# Patient Record
Sex: Female | Born: 1975 | Race: White | Hispanic: No | Marital: Single | Smoking: Former smoker
Health system: Southern US, Community
[De-identification: ages and names within clinical notes are randomized; demographics above are authoritative.]

## PROBLEM LIST (undated history)

## (undated) DIAGNOSIS — E7800 Pure hypercholesterolemia, unspecified: Secondary | ICD-10-CM

## (undated) DIAGNOSIS — F419 Anxiety disorder, unspecified: Secondary | ICD-10-CM

## (undated) DIAGNOSIS — Z8249 Family history of ischemic heart disease and other diseases of the circulatory system: Secondary | ICD-10-CM

## (undated) DIAGNOSIS — G43909 Migraine, unspecified, not intractable, without status migrainosus: Secondary | ICD-10-CM

## (undated) HISTORY — DX: Migraine, unspecified, not intractable, without status migrainosus: G43.909

## (undated) HISTORY — DX: Anxiety disorder, unspecified: F41.9

## (undated) HISTORY — DX: Family history of ischemic heart disease and other diseases of the circulatory system: Z82.49

## (undated) HISTORY — DX: Pure hypercholesterolemia, unspecified: E78.00

---

## 1999-02-06 ENCOUNTER — Other Ambulatory Visit: Admission: RE | Admit: 1999-02-06 | Discharge: 1999-02-06 | Payer: Self-pay | Admitting: Obstetrics and Gynecology

## 2000-10-20 ENCOUNTER — Other Ambulatory Visit: Admission: RE | Admit: 2000-10-20 | Discharge: 2000-10-20 | Payer: Self-pay | Admitting: Obstetrics and Gynecology

## 2002-05-05 ENCOUNTER — Other Ambulatory Visit: Admission: RE | Admit: 2002-05-05 | Discharge: 2002-05-05 | Payer: Self-pay | Admitting: Obstetrics and Gynecology

## 2004-08-30 ENCOUNTER — Other Ambulatory Visit: Admission: RE | Admit: 2004-08-30 | Discharge: 2004-08-30 | Payer: Self-pay | Admitting: Obstetrics and Gynecology

## 2004-11-26 ENCOUNTER — Ambulatory Visit (HOSPITAL_COMMUNITY): Admission: RE | Admit: 2004-11-26 | Discharge: 2004-11-26 | Payer: Self-pay | Admitting: Obstetrics and Gynecology

## 2005-01-12 ENCOUNTER — Inpatient Hospital Stay (HOSPITAL_COMMUNITY): Admission: AD | Admit: 2005-01-12 | Discharge: 2005-01-12 | Payer: Self-pay | Admitting: Obstetrics and Gynecology

## 2005-02-17 ENCOUNTER — Inpatient Hospital Stay (HOSPITAL_COMMUNITY): Admission: AD | Admit: 2005-02-17 | Discharge: 2005-02-19 | Payer: Self-pay | Admitting: Obstetrics and Gynecology

## 2005-03-24 ENCOUNTER — Other Ambulatory Visit: Admission: RE | Admit: 2005-03-24 | Discharge: 2005-03-24 | Payer: Self-pay | Admitting: Obstetrics and Gynecology

## 2021-06-07 ENCOUNTER — Observation Stay (HOSPITAL_COMMUNITY)
Admission: EM | Admit: 2021-06-07 | Discharge: 2021-06-09 | Disposition: A | Discharge status: Home / Self Care | Payer: 59 | Attending: Internal Medicine | Admitting: Internal Medicine

## 2021-06-07 ENCOUNTER — Emergency Department (HOSPITAL_COMMUNITY): Payer: 59

## 2021-06-07 ENCOUNTER — Other Ambulatory Visit: Payer: Self-pay

## 2021-06-07 DIAGNOSIS — I951 Orthostatic hypotension: Secondary | ICD-10-CM | POA: Diagnosis not present

## 2021-06-07 DIAGNOSIS — R079 Chest pain, unspecified: Secondary | ICD-10-CM | POA: Diagnosis present

## 2021-06-07 DIAGNOSIS — R9389 Abnormal findings on diagnostic imaging of other specified body structures: Secondary | ICD-10-CM

## 2021-06-07 DIAGNOSIS — Z20822 Contact with and (suspected) exposure to covid-19: Secondary | ICD-10-CM | POA: Diagnosis not present

## 2021-06-07 DIAGNOSIS — I409 Acute myocarditis, unspecified: Secondary | ICD-10-CM | POA: Diagnosis not present

## 2021-06-07 DIAGNOSIS — I319 Disease of pericardium, unspecified: Secondary | ICD-10-CM | POA: Diagnosis present

## 2021-06-07 DIAGNOSIS — R102 Pelvic and perineal pain: Secondary | ICD-10-CM

## 2021-06-07 DIAGNOSIS — N920 Excessive and frequent menstruation with regular cycle: Secondary | ICD-10-CM | POA: Diagnosis present

## 2021-06-07 DIAGNOSIS — R0602 Shortness of breath: Secondary | ICD-10-CM | POA: Insufficient documentation

## 2021-06-07 LAB — TROPONIN I (HIGH SENSITIVITY): Troponin I (High Sensitivity): 128 ng/L (ref <18)

## 2021-06-07 LAB — CBC WITH DIFFERENTIAL/PLATELET
Abs Immature Granulocytes: 0.06 10*3/uL (ref 0.00–0.07)
Basophils Absolute: 0.1 10*3/uL (ref 0.0–0.1)
Basophils Relative: 1 %
Eosinophils Absolute: 0.4 10*3/uL (ref 0.0–0.5)
Eosinophils Relative: 4 %
HCT: 36.8 % (ref 36.0–46.0)
Hemoglobin: 12.7 g/dL (ref 12.0–15.0)
Immature Granulocytes: 1 %
Lymphocytes Relative: 7 %
Lymphs Abs: 0.7 10*3/uL (ref 0.7–4.0)
MCH: 30.8 pg (ref 26.0–34.0)
MCHC: 34.5 g/dL (ref 30.0–36.0)
MCV: 89.3 fL (ref 80.0–100.0)
Monocytes Absolute: 0.8 10*3/uL (ref 0.1–1.0)
Monocytes Relative: 8 %
Neutro Abs: 7.1 10*3/uL (ref 1.7–7.7)
Neutrophils Relative %: 79 %
Platelets: 204 10*3/uL (ref 150–400)
RBC: 4.12 MIL/uL (ref 3.87–5.11)
RDW: 12.3 % (ref 11.5–15.5)
WBC: 9 10*3/uL (ref 4.0–10.5)
nRBC: 0 % (ref 0.0–0.2)

## 2021-06-07 LAB — URINALYSIS, ROUTINE W REFLEX MICROSCOPIC
Bilirubin Urine: NEGATIVE
Glucose, UA: NEGATIVE mg/dL
Hgb urine dipstick: NEGATIVE
Ketones, ur: NEGATIVE mg/dL
Leukocytes,Ua: NEGATIVE
Nitrite: NEGATIVE
Protein, ur: NEGATIVE mg/dL
Specific Gravity, Urine: 1.023 (ref 1.005–1.030)
pH: 5 (ref 5.0–8.0)

## 2021-06-07 LAB — COMPREHENSIVE METABOLIC PANEL
ALT: 24 U/L (ref 0–44)
AST: 44 U/L — ABNORMAL HIGH (ref 15–41)
Albumin: 3.2 g/dL — ABNORMAL LOW (ref 3.5–5.0)
Alkaline Phosphatase: 110 U/L (ref 38–126)
Anion gap: 9 (ref 5–15)
BUN: 10 mg/dL (ref 6–20)
CO2: 23 mmol/L (ref 22–32)
Calcium: 8.6 mg/dL — ABNORMAL LOW (ref 8.9–10.3)
Chloride: 101 mmol/L (ref 98–111)
Creatinine, Ser: 0.73 mg/dL (ref 0.44–1.00)
GFR, Estimated: >60 mL/min (ref >60)
Glucose, Bld: 120 mg/dL — ABNORMAL HIGH (ref 70–99)
Potassium: 3.8 mmol/L (ref 3.5–5.1)
Sodium: 133 mmol/L — ABNORMAL LOW (ref 135–145)
Total Bilirubin: 0.5 mg/dL (ref 0.3–1.2)
Total Protein: 6.2 g/dL — ABNORMAL LOW (ref 6.5–8.1)

## 2021-06-07 LAB — LIPASE, BLOOD: Lipase: 24 U/L (ref 11–51)

## 2021-06-07 LAB — I-STAT BETA HCG BLOOD, ED (MC, WL, AP ONLY): I-stat hCG, quantitative: <5 m[IU]/mL (ref <5)

## 2021-06-07 NOTE — ED Triage Notes (Signed)
Pt c/o LLQ pain, chest pain, shortness of breath and fevers.

## 2021-06-07 NOTE — ED Provider Notes (Signed)
Emergency Medicine Provider Triage Evaluation Note  Judy Walsh , a 45 y.o. female  was evaluated in triage.  Pt complains of CP, SOB, and LLQ abdominal pain.  Associated vaginal bleeding.  Denies urinary symptoms.  States she has had subjective fevers, but negative covid and flu tests yesterday.  Sent by OBGYN.  Review of Systems  Positive: CP, SOB, ABd pain Negative: Dysuria, vomiting  Physical Exam  BP 101/86   Pulse 97   Temp 98.7 F (37.1 C) (Oral)   Resp 18   SpO2 95%  Gen:   Awake, no distress   Resp:  Normal effort  MSK:   Moves extremities without difficulty  Other:    Medical Decision Making  Medically screening exam initiated at 10:34 PM.  Appropriate orders placed.  SHRONDA BOEH was informed that the remainder of the evaluation will be completed by another provider, this initial triage assessment does not replace that evaluation, and the importance of remaining in the ED until their evaluation is complete.  CP, Abd pain.     Roxy Horseman, PA-C 06/07/21 2235    Tegeler, Canary Brim, MD 06/07/21 [phone]

## 2021-06-07 NOTE — ED Notes (Signed)
Trop of 128 reported by the lab. CN and EDP notified.

## 2021-06-08 ENCOUNTER — Emergency Department (HOSPITAL_COMMUNITY): Payer: 59

## 2021-06-08 ENCOUNTER — Observation Stay (HOSPITAL_BASED_OUTPATIENT_CLINIC_OR_DEPARTMENT_OTHER): Payer: 59

## 2021-06-08 ENCOUNTER — Encounter (HOSPITAL_COMMUNITY): Payer: Self-pay | Admitting: Internal Medicine

## 2021-06-08 DIAGNOSIS — I319 Disease of pericardium, unspecified: Secondary | ICD-10-CM

## 2021-06-08 DIAGNOSIS — I428 Other cardiomyopathies: Secondary | ICD-10-CM | POA: Diagnosis not present

## 2021-06-08 DIAGNOSIS — I514 Myocarditis, unspecified: Secondary | ICD-10-CM

## 2021-06-08 HISTORY — DX: Disease of pericardium, unspecified: I31.9

## 2021-06-08 LAB — FERRITIN: Ferritin: 55 ng/mL (ref 11–307)

## 2021-06-08 LAB — IRON AND TIBC
Iron: 22 ug/dL — ABNORMAL LOW (ref 28–170)
Saturation Ratios: 5 % — ABNORMAL LOW (ref 10.4–31.8)
TIBC: 400 ug/dL (ref 250–450)
UIBC: 378 ug/dL

## 2021-06-08 LAB — LIPID PANEL
Cholesterol: 139 mg/dL (ref 0–200)
HDL: 54 mg/dL (ref >40)
LDL Cholesterol: 61 mg/dL (ref 0–99)
Total CHOL/HDL Ratio: 2.6 RATIO
Triglycerides: 121 mg/dL (ref <150)
VLDL: 24 mg/dL (ref 0–40)

## 2021-06-08 LAB — SEDIMENTATION RATE: Sed Rate: 20 mm/hr (ref 0–22)

## 2021-06-08 LAB — ECHOCARDIOGRAM COMPLETE
AR max vel: 3.36 cm2
AV Area VTI: 3.14 cm2
AV Area mean vel: 2.92 cm2
AV Mean grad: 3 mmHg
AV Peak grad: 4.3 mmHg
Ao pk vel: 1.04 m/s
Area-P 1/2: 5.02 cm2
Calc EF: 54.4 %
Height: 68 in
MV VTI: 2.91 cm2
S' Lateral: 3 cm
Single Plane A2C EF: 57.1 %
Single Plane A4C EF: 51.1 %
Weight: 2640 oz

## 2021-06-08 LAB — HIV ANTIBODY (ROUTINE TESTING W REFLEX): HIV Screen 4th Generation wRfx: NONREACTIVE

## 2021-06-08 LAB — RESP PANEL BY RT-PCR (FLU A&B, COVID) ARPGX2
Influenza A by PCR: NEGATIVE
Influenza B by PCR: NEGATIVE
SARS Coronavirus 2 by RT PCR: NEGATIVE

## 2021-06-08 LAB — BRAIN NATRIURETIC PEPTIDE: B Natriuretic Peptide: 811.8 pg/mL — ABNORMAL HIGH (ref 0.0–100.0)

## 2021-06-08 LAB — TROPONIN I (HIGH SENSITIVITY): Troponin I (High Sensitivity): 122 ng/L (ref <18)

## 2021-06-08 LAB — TSH: TSH: 1.317 u[IU]/mL (ref 0.350–4.500)

## 2021-06-08 LAB — T4, FREE: Free T4: 0.77 ng/dL (ref 0.61–1.12)

## 2021-06-08 LAB — C-REACTIVE PROTEIN: CRP: 11.6 mg/dL — ABNORMAL HIGH (ref <1.0)

## 2021-06-08 MED ORDER — IBUPROFEN 400 MG PO TABS
800.0000 mg | ORAL_TABLET | Freq: Three times a day (TID) | ORAL | Status: DC
  Administered 2021-06-08 – 2021-06-09 (×4): 800 mg via ORAL
  Filled 2021-06-08 (×4): qty 2

## 2021-06-08 MED ORDER — COLCHICINE 0.6 MG PO TABS
0.6000 mg | ORAL_TABLET | Freq: Two times a day (BID) | ORAL | Status: DC
  Administered 2021-06-08 – 2021-06-09 (×2): 0.6 mg via ORAL
  Filled 2021-06-08 (×2): qty 1

## 2021-06-08 MED ORDER — ONDANSETRON HCL 4 MG/2ML IJ SOLN: 4.0000 mg | Freq: Four times a day (QID) | INTRAMUSCULAR | Status: DC | PRN

## 2021-06-08 MED ORDER — COLCHICINE 0.6 MG PO TABS
1.2000 mg | ORAL_TABLET | Freq: Once | ORAL | Status: AC
  Administered 2021-06-08: 1.2 mg via ORAL
  Filled 2021-06-08: qty 2

## 2021-06-08 MED ORDER — ACETAMINOPHEN 325 MG PO TABS
650.0000 mg | ORAL_TABLET | ORAL | Status: DC | PRN
  Administered 2021-06-08 (×2): 650 mg via ORAL
  Filled 2021-06-08 (×2): qty 2

## 2021-06-08 NOTE — Progress Notes (Signed)
Pt arrived to the floor from ED at approx. 1400. Pt is under observation, hooked up to tele alert & oriented x4 w/ no PMH. VSS and afebrile. Pt c/o currently chest pain and head ache, but mild. Tylenol given at 1403. 20G R AC flushes and saline locked. Pt is able to eat, void, no BM since arrival to [facility]. Pt given menu, bed brakes locked. Will continue to monitor.

## 2021-06-08 NOTE — Progress Notes (Signed)
   Seen and personally examined.  She was admitted earlier this morning by our overnight call fellow.  Working diagnosis is a myopericarditis with mild flat elevation of troponin and elevated BNP.  She was not felt to be volume overloaded, however and was given a dose of colchicine.  Echo was performed today and I personally reviewed it.  It shows normal systolic and diastolic function without any wall motion abnormalities.  There is no pericardial effusion.  I spoke with her at some length at the bedside.  He does have like she had a viral illness with several members having influenza A but she tested negative here.  She did have fever and body aches.  She is tired today.  She thinks that her chest heaviness is improved somewhat with the colchicine and I would recommend continuing it at 0.6 mg twice daily and will start ibuprofen 800 mg 3 times daily in addition to that. Labs also indicated low iron stores, although she is not "anemic" - history of DUB - advised she discuss with her GYN as to whether she should take iron.  Will re-examine tomorrow - if she is showing clinical improvement, may be able to be discharged.  Chrystie Nose, MD, [facility], FACP  [place]  [facility]  Medical Director of the [facility] Lipid [facility] &  Cardiovascular Risk Reduction Clinic Diplomate of the American Board of Clinical Lipidology Attending Cardiologist  Direct Dial: [phone]  Fax: [phone]  Website:  [email or web address]

## 2021-06-08 NOTE — H&P (Signed)
Cardiology Admission History and Physical:   Patient ID: ZAVANNAH KEMMERER MRN: [id number]; DOB: Nov 28, 1975   Admission date: 06/07/2021  PCP:  Pcp, No   [facility] Providers Cardiologist:  None       Chief Complaint:  "Today I started to feel short of breath and had some chest pain"  Patient Profile:   Judy Walsh is a 45 y.o. female with no significant PMHx who is being seen 06/08/2021 for the evaluation of chest pain/SOB.  History of Present Illness:   Judy Walsh has no prior cardiac history.   Recently, Judy Walsh has been struggling with very heavy menstrual bleeding, but up until the last few days have generally been feeling quite well.  About 2 weeks ago her daughter had the flu, and a few days ago one of her coworkers also developed the flu.  Starting a few days ago, Judy Walsh started to have crampy lower abdominal pain and was feeling quite fatigued.  2 nights ago she had chills and sweats when she went to bed, and last night had an objective fever of 101.  Starting today she also started to have sharp left-sided chest pain that did appear to be worse when she laid down and may be a little bit better when she sat up.  There was no relation to exertion.  Yesterday evening she also noticed that she was much more short of breath when she got up and walked to the kitchen.  Her shortness of breath resolved when she sat down and rested but she remained quite fatigued.  She denies any lower extremity swelling.  Given these new and progressive symptoms Judy Walsh decided to come to the emergency department for evaluation.  On arrival to the emergency department Judy Walsh was hemodynamically stable.  She had a chest x-ray that was unrevealing.  Her EKG was normal.  Interestingly her high-sensitivity troponin was initially elevated at 128 and trended to 122.  Her BNP was also quite elevated at 811.  She also had elevated inflammatory markers with a CRP of 11.6.  Her flu and COVID  were negative.  History reviewed. No pertinent past medical history.  History reviewed. No pertinent surgical history.   Medications Prior to Admission: Prior to Admission medications   Medication Sig Start Date End Date Taking? Authorizing Provider  COLLAGEN PO Take 1 capsule by mouth daily.   Yes [provider]  ibuprofen (ADVIL) 200 MG tablet Take 800 mg by mouth every 6 (six) hours as needed for headache, moderate pain or fever.   Yes [provider]  MILI 0.25-35 MG-MCG tablet Take 1 tablet by mouth daily. 05/25/21  Yes [provider]  Multiple Vitamins-Minerals (WOMENS MULTI GUMMIES) CHEW Chew 2 tablets by mouth daily.   Yes [provider]  OVER THE COUNTER MEDICATION Take 2 tablets by mouth in the morning and at bedtime. Nutriform vitamin   Yes [provider]     Allergies:   No Known Allergies  Social History:   Social History   Socioeconomic History   Marital status: Single    Spouse name: Not on file   Number of children: Not on file   Years of education: Not on file   Highest education level: Not on file  Occupational History   Not on file  Tobacco Use   Smoking status: Not on file   Smokeless tobacco: Not on file  Substance and Sexual Activity   Alcohol use: Not on file  Drug use: Not on file   Sexual activity: Not on file  Other Topics Concern   Not on file  Social History Narrative   Not on file   Social Determinants of Health   Financial Resource Strain: Not on file  Food Insecurity: Not on file  Transportation Needs: Not on file  Physical Activity: Not on file  Stress: Not on file  Social Connections: Not on file  Intimate Partner Violence: Not on file    Family History:   The patient's family history includes CVA in her father.    ROS:  Please see the history of present illness.  All other ROS reviewed and negative.     Physical Exam/Data:   Vitals:   06/08/21 0315 06/08/21 0330 06/08/21  0345 06/08/21 0400  BP: 93/71 96/76 96/73  99/80  Pulse: 96 95 95 (!) 102  Resp: (!) 22 12 (!) 24 (!) 27  Temp:      TempSrc:      SpO2: 99% 97% 100% 98%   No intake or output data in the 24 hours ending 06/08/21 0539 No flowsheet data found.   There is no height or weight on file to calculate BMI.  General:  Well nourished, well developed, in no acute distress HEENT: normal Neck: no JVD Vascular: No carotid bruits; Distal pulses 2+ bilaterally   Cardiac:  normal S1, S2; RRR; no murmur  Lungs:  clear to auscultation bilaterally, no wheezing, rhonchi or rales  Abd: soft, nontender, no hepatomegaly  Ext: no edema Musculoskeletal:  No deformities, BUE and BLE strength normal and equal Skin: warm and dry  Neuro:  CNs 2-12 intact, no focal abnormalities noted Psych:  Normal affect   EKG:  The ECG that was done 06/07/21 was personally reviewed and demonstrates NSR  Relevant CV Studies: None  Laboratory Data:  High Sensitivity Troponin:   Recent Labs  Lab 06/07/21 2244 06/08/21 0053  TROPONINIHS 128* 122*      Chemistry Recent Labs  Lab 06/07/21 2244  NA 133*  K 3.8  CL 101  CO2 23  GLUCOSE 120*  BUN 10  CREATININE 0.73  CALCIUM 8.6*  GFRNONAA >60  ANIONGAP 9    Recent Labs  Lab 06/07/21 2244  PROT 6.2*  ALBUMIN 3.2*  AST 44*  ALT 24  ALKPHOS 110  BILITOT 0.5   Lipids No results for input(s): CHOL, TRIG, HDL, LABVLDL, LDLCALC, CHOLHDL in the last 168 hours. Hematology Recent Labs  Lab 06/07/21 2244  WBC 9.0  RBC 4.12  HGB 12.7  HCT 36.8  MCV 89.3  MCH 30.8  MCHC 34.5  RDW 12.3  PLT 204   Thyroid No results for input(s): TSH, FREET4 in the last 168 hours. BNP Recent Labs  Lab 06/08/21 0212  BNP 811.8*    DDimer No results for input(s): DDIMER in the last 168 hours.   Radiology/Studies:  DG Chest 2 View  Result Date: 06/08/2021 CLINICAL DATA:  Chest pain, shortness of breath. EXAM: CHEST - 2 VIEW COMPARISON:  None. FINDINGS: The heart  size and mediastinal contours are within normal limits. Both lungs are clear. A 4 mm nodular opacity is seen in the mid left lung. There is a 5 mm nodular opacity in the right upper lobe. No acute osseous abnormality. IMPRESSION: 1. No acute cardiopulmonary process. 2. Vague nodular opacities in the mid left lung and right upper lobe which may be due to overlapping structures. Comparison with older imaging studies or follow-up is recommended to  exclude pulmonary nodule. Electronically Signed   By: Thornell Sartorius M.D.   On: 06/08/2021 00:46   US PELVIC COMPLETE W TRANSVAGINAL AND TORSION R/O  Result Date: 06/08/2021 CLINICAL DATA:  Left lower quadrant abdominal pain.  Unknown LMP. EXAM: TRANSABDOMINAL AND TRANSVAGINAL ULTRASOUND OF PELVIS DOPPLER ULTRASOUND OF OVARIES TECHNIQUE: Both transabdominal and transvaginal ultrasound examinations of the pelvis were performed. Transabdominal technique was performed for global imaging of the pelvis including uterus, ovaries, adnexal regions, and pelvic cul-de-sac. It was necessary to proceed with endovaginal exam following the transabdominal exam to visualize the endometrium. Color and duplex Doppler ultrasound was utilized to evaluate blood flow to the ovaries. COMPARISON:  None. FINDINGS: Uterus Measurements: 9.7 x 4.8 x 6.8 cm = volume: 164 mL. No fibroids or other mass visualized. The uterus is anteverted. Nabothian cyst noted within the external cervical os. The cervix is otherwise unremarkable. Endometrium Thickness: 4 mm.  No focal abnormality visualized. Right ovary Measurements: 2.6 x 1.4 x 1.4 cm = volume: 3 mL. Normal appearance/no adnexal mass. Left ovary Measurements: 3.7 x 1.7 x 1.7 cm = volume: 6 mL. Normal appearance/no adnexal mass. Pulsed Doppler evaluation of both ovaries demonstrates normal low-resistance arterial and venous waveforms. Other findings No abnormal free fluid. IMPRESSION: Normal pelvic sonogram Electronically Signed   By: Helyn Numbers  M.D.   On: 06/08/2021 00:05     Assessment and Plan:   #Myopericarditis: - Presented with antecedent fever, fatigue, sick contacts followed by positional L sided chest pain + associated SOB. Troponins flat but elevated with no ischemic EKG changes. BNP also elevated although clinically appears relatively euvolemic.  - While her flu and COVID were negative, she certainly has symptoms consistent with a viral infection (and had recent sick contacts). I suspect she has developed myopericardits. Fortunately, she is hemodynamically stable with relatively mild symptoms  - Check echo in the AM. Can consider cMRI as well  - Extended RVP deferred given classic symptoms + sick contacts  - Check TSH/T4, lipid panel, A1c - Will give one dose of colchicine now. Favor deferring NSAIDs until we get an assessment of her BiV function (also has had very heavy menstrual bleeding recently)   Risk Assessment/Risk Scores:  Severity of Illness: The appropriate patient status for this patient is OBSERVATION. Observation status is judged to be reasonable and necessary in order to provide the required intensity of service to ensure the patient's safety. The patient's presenting symptoms, physical exam findings, and initial radiographic and laboratory data in the context of their medical condition is felt to place them at decreased risk for further clinical deterioration. Furthermore, it is anticipated that the patient will be medically stable for discharge from the hospital within 2 midnights of admission.    For questions or updates, please contact [facility] Please consult [email or web address] for contact info under     Signed, Livingston Diones, MD  06/08/2021 5:39 AM

## 2021-06-08 NOTE — Plan of Care (Signed)
  Problem: Pain Managment: Goal: General experience of comfort will improve 06/08/2021 2057 by Orson Ape, RN Outcome: Progressing 06/08/2021 2057 by Orson Ape, RN Outcome: Progressing   Problem: Education: Goal: Knowledge of General Education information will improve Description: Including pain rating scale, medication(s)/side effects and non-pharmacologic comfort measures Outcome: Progressing   Problem: Health Behavior/Discharge Planning: Goal: Ability to manage health-related needs will improve Outcome: Progressing   Problem: Clinical Measurements: Goal: Ability to maintain clinical measurements within normal limits will improve Outcome: Progressing Goal: Will remain free from infection Outcome: Progressing Goal: Diagnostic test results will improve Outcome: Progressing Goal: Respiratory complications will improve Outcome: Progressing Goal: Cardiovascular complication will be avoided Outcome: Progressing   Problem: Activity: Goal: Risk for activity intolerance will decrease Outcome: Progressing   Problem: Nutrition: Goal: Adequate nutrition will be maintained Outcome: Progressing   Problem: Coping: Goal: Level of anxiety will decrease Outcome: Progressing   Problem: Elimination: Goal: Will not experience complications related to bowel motility Outcome: Progressing Goal: Will not experience complications related to urinary retention Outcome: Progressing   Problem: Pain Managment: Goal: General experience of comfort will improve Outcome: Progressing   Problem: Safety: Goal: Ability to remain free from injury will improve Outcome: Progressing   Problem: Skin Integrity: Goal: Risk for impaired skin integrity will decrease Outcome: Progressing

## 2021-06-08 NOTE — ED Notes (Signed)
MD made aware of BP, pt resting at present

## 2021-06-08 NOTE — ED Notes (Signed)
Patient coming to room from scan.

## 2021-06-08 NOTE — Plan of Care (Signed)
Will continue to monitor.

## 2021-06-08 NOTE — Progress Notes (Signed)
*  PRELIMINARY RESULTS* Echocardiogram 2D Echocardiogram has been performed.  Judy Walsh 06/08/2021, 12:52 PM

## 2021-06-08 NOTE — ED Provider Notes (Signed)
[facility] EMERGENCY DEPARTMENT Provider Note   CSN: [id number] Arrival date & time: 06/07/21  2144     History Chief Complaint  Patient presents with   Abdominal Pain    Judy Walsh is a 45 y.o. female.  Patient is a 45 year old female with no significant past medical history.  She presents with a 2-day history of weakness, fatigue, fever, and feeling generally poorly.  She was seen by urgent care and diagnosed with URI, but had negative COVID and influenza tests.  Patient began experiencing chest discomfort this evening and presents for evaluation of this.  She has no prior cardiac history.  She denies any shortness of breath, nausea, diaphoresis, or radiation to the arm or jaw.  She denies any exertional complaints.  She also reports some left lower quadrant abdominal discomfort, but no diarrhea or urinary complaints.  The history is provided by the patient.      No past medical history on file.  There are no problems to display for this patient.      OB History   No obstetric history on file.     No family history on file.     Home Medications Prior to Admission medications   Not on File    Allergies    Patient has no known allergies.  Review of Systems   Review of Systems  All other systems reviewed and are negative.  Physical Exam Updated Vital Signs BP 93/70 (BP Location: Right Arm)   Pulse 98   Temp 98.5 F (36.9 C) (Oral)   Resp (!) 22   LMP 05/30/2021 Comment: pt shielded  SpO2 99%   Physical Exam Vitals and nursing note reviewed.  Constitutional:      General: She is not in acute distress.    Appearance: She is well-developed. She is not diaphoretic.  HENT:     Head: Normocephalic and atraumatic.  Cardiovascular:     Rate and Rhythm: Normal rate and regular rhythm.     Heart sounds: No murmur heard.   No friction rub. No gallop.  Pulmonary:     Effort: Pulmonary effort is normal. No respiratory distress.      Breath sounds: Normal breath sounds. No wheezing.  Abdominal:     General: Bowel sounds are normal. There is no distension.     Palpations: Abdomen is soft.     Tenderness: There is abdominal tenderness in the left lower quadrant. There is no right CVA tenderness, left CVA tenderness, guarding or rebound.  Musculoskeletal:        General: Normal range of motion.     Cervical back: Normal range of motion and neck supple.  Skin:    General: Skin is warm and dry.  Neurological:     General: No focal deficit present.     Mental Status: She is alert and oriented to person, place, and time.    ED Results / Procedures / Treatments   Labs (all labs ordered are listed, but only abnormal results are displayed) Labs Reviewed  COMPREHENSIVE METABOLIC PANEL - Abnormal; Notable for the following components:      Result Value   Sodium 133 (*)    Glucose, Bld 120 (*)    Calcium 8.6 (*)    Total Protein 6.2 (*)    Albumin 3.2 (*)    AST 44 (*)    All other components within normal limits  URINALYSIS, ROUTINE W REFLEX MICROSCOPIC - Abnormal; Notable for the following components:  APPearance HAZY (*)    All other components within normal limits  TROPONIN I (HIGH SENSITIVITY) - Abnormal; Notable for the following components:   Troponin I (High Sensitivity) 128 (*)    All other components within normal limits  RESP PANEL BY RT-PCR (FLU A&B, COVID) ARPGX2  LIPASE, BLOOD  CBC WITH DIFFERENTIAL/PLATELET  CBC  I-STAT BETA HCG BLOOD, ED ([facility], [facility], [facility] ONLY)  I-STAT BETA HCG BLOOD, ED ([facility], [facility], [facility] ONLY)  TROPONIN I (HIGH SENSITIVITY)    EKG EKG Interpretation  Date/Time:  Friday June 07 2021 22:33:46 EST Ventricular Rate:  90 PR Interval:  124 QRS Duration: 74 QT Interval:  362 QTC Calculation: 442 R Axis:   79 Text Interpretation: Normal sinus rhythm Nonspecific T wave abnormality Abnormal ECG Confirmed by Veryl Speak [phone]) on 06/08/2021 2:05:30 AM  Radiology DG Chest 2  View  Result Date: 06/08/2021 CLINICAL DATA:  Chest pain, shortness of breath. EXAM: CHEST - 2 VIEW COMPARISON:  None. FINDINGS: The heart size and mediastinal contours are within normal limits. Both lungs are clear. A 4 mm nodular opacity is seen in the mid left lung. There is a 5 mm nodular opacity in the right upper lobe. No acute osseous abnormality. IMPRESSION: 1. No acute cardiopulmonary process. 2. Vague nodular opacities in the mid left lung and right upper lobe which may be due to overlapping structures. Comparison with older imaging studies or follow-up is recommended to exclude pulmonary nodule. Electronically Signed   By: Brett Fairy M.D.   On: 06/08/2021 00:46   US PELVIC COMPLETE W TRANSVAGINAL AND TORSION R/O  Result Date: 06/08/2021 CLINICAL DATA:  Left lower quadrant abdominal pain.  Unknown LMP. EXAM: TRANSABDOMINAL AND TRANSVAGINAL ULTRASOUND OF PELVIS DOPPLER ULTRASOUND OF OVARIES TECHNIQUE: Both transabdominal and transvaginal ultrasound examinations of the pelvis were performed. Transabdominal technique was performed for global imaging of the pelvis including uterus, ovaries, adnexal regions, and pelvic cul-de-sac. It was necessary to proceed with endovaginal exam following the transabdominal exam to visualize the endometrium. Color and duplex Doppler ultrasound was utilized to evaluate blood flow to the ovaries. COMPARISON:  None. FINDINGS: Uterus Measurements: 9.7 x 4.8 x 6.8 cm = volume: 164 mL. No fibroids or other mass visualized. The uterus is anteverted. Nabothian cyst noted within the external cervical os. The cervix is otherwise unremarkable. Endometrium Thickness: 4 mm.  No focal abnormality visualized. Right ovary Measurements: 2.6 x 1.4 x 1.4 cm = volume: 3 mL. Normal appearance/no adnexal mass. Left ovary Measurements: 3.7 x 1.7 x 1.7 cm = volume: 6 mL. Normal appearance/no adnexal mass. Pulsed Doppler evaluation of both ovaries demonstrates normal low-resistance arterial  and venous waveforms. Other findings No abnormal free fluid. IMPRESSION: Normal pelvic sonogram Electronically Signed   By: Fidela Salisbury M.D.   On: 06/08/2021 00:05    Procedures Procedures   Medications Ordered in ED Medications - No data to display  ED Course  I have reviewed the triage vital signs and the nursing notes.  Pertinent labs & imaging results that were available during my care of the patient were reviewed by me and considered in my medical decision making (see chart for details).    MDM Rules/Calculators/A&P  Patient is an otherwise healthy 45 year old female presenting with complaints of weakness, fatigue, fever, and feeling generally unwell.  She describes temperature of 1-1.7 at home, but is afebrile here.  Laboratory studies obtained reveal a mildly elevated troponin of 118.  Remainder of laboratory studies at this point are otherwise  unremarkable.  Patient's presentation concerning for myocarditis.  This was discussed with Dr. Sherrilee Gilles from cardiology.  He has recommended adding on BNP and inflammatory markers.  Patient does have an elevated CRP of 11.6 and BNP of 800.  Care again discussed with Dr. Sherrilee Gilles who is recommending patient be admitted for echocardiogram and further work-up.  Final Clinical Impression(s) / ED Diagnoses Final diagnoses:  Pelvic pain    Rx / DC Orders ED Discharge Orders     None        Geoffery Lyons, MD 06/08/21 [phone]

## 2021-06-09 ENCOUNTER — Encounter (HOSPITAL_COMMUNITY): Payer: Self-pay | Admitting: Internal Medicine

## 2021-06-09 DIAGNOSIS — I951 Orthostatic hypotension: Secondary | ICD-10-CM | POA: Diagnosis not present

## 2021-06-09 DIAGNOSIS — N920 Excessive and frequent menstruation with regular cycle: Secondary | ICD-10-CM | POA: Diagnosis present

## 2021-06-09 DIAGNOSIS — R9389 Abnormal findings on diagnostic imaging of other specified body structures: Secondary | ICD-10-CM

## 2021-06-09 DIAGNOSIS — I319 Disease of pericardium, unspecified: Secondary | ICD-10-CM | POA: Diagnosis not present

## 2021-06-09 HISTORY — DX: Abnormal findings on diagnostic imaging of other specified body structures: R93.89

## 2021-06-09 LAB — COMPREHENSIVE METABOLIC PANEL
ALT: 36 U/L (ref 0–44)
AST: 49 U/L — ABNORMAL HIGH (ref 15–41)
Albumin: 2.9 g/dL — ABNORMAL LOW (ref 3.5–5.0)
Alkaline Phosphatase: 144 U/L — ABNORMAL HIGH (ref 38–126)
Anion gap: 8 (ref 5–15)
BUN: 9 mg/dL (ref 6–20)
CO2: 25 mmol/L (ref 22–32)
Calcium: 8.7 mg/dL — ABNORMAL LOW (ref 8.9–10.3)
Chloride: 104 mmol/L (ref 98–111)
Creatinine, Ser: 0.68 mg/dL (ref 0.44–1.00)
GFR, Estimated: >60 mL/min (ref >60)
Glucose, Bld: 122 mg/dL — ABNORMAL HIGH (ref 70–99)
Potassium: 3.6 mmol/L (ref 3.5–5.1)
Sodium: 137 mmol/L (ref 135–145)
Total Bilirubin: 0.5 mg/dL (ref 0.3–1.2)
Total Protein: 5.8 g/dL — ABNORMAL LOW (ref 6.5–8.1)

## 2021-06-09 LAB — BRAIN NATRIURETIC PEPTIDE: B Natriuretic Peptide: 551.8 pg/mL — ABNORMAL HIGH (ref 0.0–100.0)

## 2021-06-09 LAB — C-REACTIVE PROTEIN: CRP: 7.6 mg/dL — ABNORMAL HIGH (ref <1.0)

## 2021-06-09 MED ORDER — IBUPROFEN 200 MG PO TABS: 800.0000 mg | ORAL_TABLET | Freq: Three times a day (TID) | ORAL | 0 refills | Status: DC

## 2021-06-09 MED ORDER — FAMOTIDINE 20 MG PO TABS: 20.0000 mg | ORAL_TABLET | Freq: Two times a day (BID) | ORAL | 1 refills | Status: DC

## 2021-06-09 MED ORDER — COLCHICINE 0.6 MG PO TABS: 0.6000 mg | ORAL_TABLET | Freq: Two times a day (BID) | ORAL | 2 refills | Status: DC

## 2021-06-09 MED ORDER — SODIUM CHLORIDE 0.9 % IV BOLUS
1000.0000 mL | Freq: Once | INTRAVENOUS | Status: AC
  Administered 2021-06-09: 1000 mL via INTRAVENOUS

## 2021-06-09 NOTE — Discharge Instructions (Signed)
Ibuprofen and Colchicine are used to treat Myopericarditis. Take Ibuprofen 800 mg three times a day for 4 days, Then take Ibuprofen 600 mg three times a day for 4 days Then take Ibuprofen 400 mg three times a day for 4 days Then take Ibuprofen 200 mg three times a day for 4 days, then stop.  You will need to take Colchicine 0.6 mg twice daily for 3 mos.  You can take Famotidine (Pepcid) 20 mg twice daily while you are taking the Ibuprofen to help protect your stomach.  You can get the Ibuprofen and Famotidine over the counter.  Your chest X-ray showed a non-specific abnormality.  If you cannot get a repeat chest X-ray with a primary care doctor in the next month, discuss with Dr. Rennis Golden at your follow up to arrange a repeat xray.

## 2021-06-09 NOTE — Progress Notes (Signed)
DAILY PROGRESS NOTE   Patient Name: Judy Walsh Date of Encounter: 06/09/2021 Cardiologist: None  Chief Complaint   Positional dizziness  Patient Profile   Judy Walsh is a 45 y.o. female with no significant PMHx who is being seen 06/08/2021 for the evaluation of chest pain/SOB.  Subjective   Noted to have some positional dizziness this morning when waking up to brush her teeth. No chest pain, not clearly better on ibuprofen and colchicine, but working diagnosis is myopericarditis.  Objective   Vitals:   06/08/21 1941 06/08/21 2301 06/09/21 0326 06/09/21 0845  BP: 93/71 102/75 100/73 93/72  Pulse: 87 89 89 91  Resp: 20 17 20 20   Temp: 98.1 F (36.7 C) 97.7 F (36.5 C) 97.8 F (36.6 C) 98 F (36.7 C)  TempSrc: Oral Oral Oral Oral  SpO2: 98% 97% 98% 98%  Weight:      Height:        Intake/Output Summary (Last 24 hours) at 06/09/2021 1130 Last data filed at 06/09/2021 0700 Gross per 24 hour  Intake 360 ml  Output 0 ml  Net 360 ml   Filed Weights   06/08/21 0736  Weight: 74.8 kg    Physical Exam   General appearance: alert and no distress Neck: no carotid bruit, no JVD, and thyroid not enlarged, symmetric, no tenderness/mass/nodules Lungs: clear to auscultation bilaterally Heart: regular tachycardia Abdomen: soft, non-tender; bowel sounds normal; no masses,  no organomegaly and scacphoid Extremities: extremities normal, atraumatic, no cyanosis or edema Pulses: 2+ and symmetric Skin: Skin color, texture, turgor normal. No rashes or lesions Neurologic: Grossly normal Psych: Pleasant  Inpatient Medications    Scheduled Meds:  colchicine  0.6 mg Oral BID   ibuprofen  800 mg Oral TID    Continuous Infusions:   PRN Meds: acetaminophen, ondansetron (ZOFRAN) IV   Labs   Results for orders placed or performed during the hospital encounter of 06/07/21 (from the past 48 hour(s))  Urinalysis, Routine w reflex microscopic Urine, Clean Catch      Status: Abnormal   Collection Time: 06/07/21 10:34 PM  Result Value Ref Range   Color, Urine YELLOW YELLOW   APPearance HAZY (A) CLEAR   Specific Gravity, Urine 1.023 1.005 - 1.030   pH 5.0 5.0 - 8.0   Glucose, UA NEGATIVE NEGATIVE mg/dL   Hgb urine dipstick NEGATIVE NEGATIVE   Bilirubin Urine NEGATIVE NEGATIVE   Ketones, ur NEGATIVE NEGATIVE mg/dL   Protein, ur NEGATIVE NEGATIVE mg/dL   Nitrite NEGATIVE NEGATIVE   Leukocytes,Ua NEGATIVE NEGATIVE    Comment: Performed at [facility], [address] [place], [place] [address]  Troponin I (High Sensitivity)     Status: Abnormal   Collection Time: 06/07/21 10:44 PM  Result Value Ref Range   Troponin I (High Sensitivity) 128 (HH) <18 ng/L    Comment: CRITICAL RESULT CALLED TO, READ BACK BY AND VERIFIED WITH: JOSH NEWTON RN 06/07/21 2348 M KOROLESKI (NOTE) Elevated high sensitivity troponin I (hsTnI) values and significant  changes across serial measurements may suggest ACS but many other  chronic and acute conditions are known to elevate hsTnI results.  Refer to the Links section for chest pain algorithms and additional  guidance. Performed at [facility], [place] [address], [place], [place] [address]   Comprehensive metabolic panel     Status: Abnormal   Collection Time: 06/07/21 10:44 PM  Result Value Ref Range   Sodium 133 (L) 135 - 145 mmol/L  Potassium 3.8 3.5 - 5.1 mmol/L   Chloride 101 98 - 111 mmol/L   CO2 23 22 - 32 mmol/L   Glucose, Bld 120 (H) 70 - 99 mg/dL    Comment: Glucose reference range applies only to samples taken after fasting for at least 8 hours.   BUN 10 6 - 20 mg/dL   Creatinine, Ser 3.82 0.44 - 1.00 mg/dL   Calcium 8.6 (L) 8.9 - 10.3 mg/dL   Total Protein 6.2 (L) 6.5 - 8.1 g/dL   Albumin 3.2 (L) 3.5 - 5.0 g/dL   AST 44 (H) 15 - 41 U/L   ALT 24 0 - 44 U/L   Alkaline Phosphatase 110 38 - 126 U/L   Total Bilirubin 0.5 0.3 - 1.2 mg/dL   GFR, Estimated >50 >53 mL/min    Comment:  (NOTE) Calculated using the CKD-EPI Creatinine Equation (2021)    Anion gap 9 5 - 15    Comment: Performed at [facility], [address] [place], [place] [address]  Lipase, blood     Status: None   Collection Time: 06/07/21 10:44 PM  Result Value Ref Range   Lipase 24 11 - 51 U/L    Comment: Performed at [facility], [address], [place], [place] [address]  CBC with Diff     Status: None   Collection Time: 06/07/21 10:44 PM  Result Value Ref Range   WBC 9.0 4.0 - 10.5 K/uL   RBC 4.12 3.87 - 5.11 MIL/uL   Hemoglobin 12.7 12.0 - 15.0 g/dL   HCT 90.2 40.9 - 73.5 %   MCV 89.3 80.0 - 100.0 fL   MCH 30.8 26.0 - 34.0 pg   MCHC 34.5 30.0 - 36.0 g/dL   RDW 32.9 92.4 - 26.8 %   Platelets 204 150 - 400 K/uL   nRBC 0.0 0.0 - 0.2 %   Neutrophils Relative % 79 %   Neutro Abs 7.1 1.7 - 7.7 K/uL   Lymphocytes Relative 7 %   Lymphs Abs 0.7 0.7 - 4.0 K/uL   Monocytes Relative 8 %   Monocytes Absolute 0.8 0.1 - 1.0 K/uL   Eosinophils Relative 4 %   Eosinophils Absolute 0.4 0.0 - 0.5 K/uL   Basophils Relative 1 %   Basophils Absolute 0.1 0.0 - 0.1 K/uL   Immature Granulocytes 1 %   Abs Immature Granulocytes 0.06 0.00 - 0.07 K/uL    Comment: Performed at [facility], [address], [place], [place] [address]  I-Stat beta hCG blood, ED     Status: None   Collection Time: 06/07/21 10:47 PM  Result Value Ref Range   I-stat hCG, quantitative <5.0 <5 mIU/mL   Comment 3            Comment:   GEST. AGE      CONC.  (mIU/mL)   <=1 WEEK        5 - 50     2 WEEKS       50 - 500     3 WEEKS       100 - 10,000     4 WEEKS     1,000 - 30,000        FEMALE AND NON-PREGNANT FEMALE:     LESS THAN 5 mIU/mL   Troponin I (High Sensitivity)     Status: Abnormal   Collection Time: 06/08/21 12:53 AM  Result Value Ref Range   Troponin I (High Sensitivity)  122 (HH) <18 ng/L    Comment: CRITICAL VALUE NOTED.  VALUE IS CONSISTENT WITH PREVIOUSLY REPORTED AND CALLED  VALUE. (NOTE) Elevated high sensitivity troponin I (hsTnI) values and significant  changes across serial measurements may suggest ACS but many other  chronic and acute conditions are known to elevate hsTnI results.  Refer to the Links section for chest pain algorithms and additional  guidance. Performed at [facility], [place] [address], [place], [place] [address]   Resp Panel by RT-PCR (Flu A&B, Covid) Nasopharyngeal Swab     Status: None   Collection Time: 06/08/21  1:31 AM   Specimen: Nasopharyngeal Swab; Nasopharyngeal(NP) swabs in vial transport medium  Result Value Ref Range   SARS Coronavirus 2 by RT PCR NEGATIVE NEGATIVE    Comment: (NOTE) SARS-CoV-2 target nucleic acids are NOT DETECTED.  The SARS-CoV-2 RNA is generally detectable in upper respiratory specimens during the acute phase of infection. The lowest concentration of SARS-CoV-2 viral copies this assay can detect is 138 copies/mL. A negative result does not preclude SARS-Cov-2 infection and should not be used as the sole basis for treatment or other patient management decisions. A negative result may occur with  improper specimen collection/handling, submission of specimen other than nasopharyngeal swab, presence of viral mutation(s) within the areas targeted by this assay, and inadequate number of viral copies(<138 copies/mL). A negative result must be combined with clinical observations, patient history, and epidemiological information. The expected result is Negative.  Fact Sheet for Patients:  EntrepreneurPulse.com.au  Fact Sheet for Healthcare Providers:  IncredibleEmployment.be  This test is no t yet approved or cleared by the [place] FDA and  has been authorized for detection and/or diagnosis of SARS-CoV-2 by FDA under an Emergency Use Authorization (EUA). This EUA will remain  in effect (meaning this test can be used) for the duration of the COVID-19  declaration under Section 564(b)(1) of the Act, 21 U.S.C.section 360bbb-3(b)(1), unless the authorization is terminated  or revoked sooner.       Influenza A by PCR NEGATIVE NEGATIVE   Influenza B by PCR NEGATIVE NEGATIVE    Comment: (NOTE) The Xpert Xpress SARS-CoV-2/FLU/RSV plus assay is intended as an aid in the diagnosis of influenza from Nasopharyngeal swab specimens and should not be used as a sole basis for treatment. Nasal washings and aspirates are unacceptable for Xpert Xpress SARS-CoV-2/FLU/RSV testing.  Fact Sheet for Patients: EntrepreneurPulse.com.au  Fact Sheet for Healthcare Providers: IncredibleEmployment.be  This test is not yet approved or cleared by the [place] FDA and has been authorized for detection and/or diagnosis of SARS-CoV-2 by FDA under an Emergency Use Authorization (EUA). This EUA will remain in effect (meaning this test can be used) for the duration of the COVID-19 declaration under Section 564(b)(1) of the Act, 21 U.S.C. section 360bbb-3(b)(1), unless the authorization is terminated or revoked.  Performed at [facility], [place] [address], [place], [place] [address]   Sedimentation rate     Status: None   Collection Time: 06/08/21  2:11 AM  Result Value Ref Range   Sed Rate 20 0 - 22 mm/hr    Comment: Performed at [facility] [address] [place], [place] [address]  C-reactive protein     Status: Abnormal   Collection Time: 06/08/21  2:11 AM  Result Value Ref Range   CRP 11.6 (H) <1.0 mg/dL    Comment: Performed at [facility] [address], [place],  [address]  Brain  natriuretic peptide     Status: Abnormal   Collection Time: 06/08/21  2:12 AM  Result Value Ref Range   B Natriuretic Peptide 811.8 (H) 0.0 - 100.0 pg/mL    Comment: Performed at [facility] [address] [place], [place] [address]  Lipid panel     Status: None   Collection Time: 06/08/21   4:45 AM  Result Value Ref Range   Cholesterol 139 0 - 200 mg/dL   Triglycerides 121 <150 mg/dL   HDL 54 >40 mg/dL   Total CHOL/HDL Ratio 2.6 RATIO   VLDL 24 0 - 40 mg/dL   LDL Cholesterol 61 0 - 99 mg/dL    Comment:        Total Cholesterol/HDL:CHD Risk Coronary Heart Disease Risk Table                     Men   Women  1/2 Average Risk   3.4   3.3  Average Risk       5.0   4.4  2 X Average Risk   9.6   7.1  3 X Average Risk  23.4   11.0        Use the calculated Patient Ratio above and the CHD Risk Table to determine the patient's CHD Risk.        ATP III CLASSIFICATION (LDL):  <100     mg/dL   Optimal  100-129  mg/dL   Near or Above                    Optimal  130-159  mg/dL   Borderline  160-189  mg/dL   High  >190     mg/dL   Very High Performed at [facility] [address] [place], [place] [address]   TSH     Status: None   Collection Time: 06/08/21  4:45 AM  Result Value Ref Range   TSH 1.317 0.350 - 4.500 uIU/mL    Comment: Performed by a 3rd Generation assay with a functional sensitivity of <=0.01 uIU/mL. Performed at [facility], [place] [address], [place], [place] [address]   T4, free     Status: None   Collection Time: 06/08/21  4:45 AM  Result Value Ref Range   Free T4 0.77 0.61 - 1.12 ng/dL    Comment: (NOTE) Biotin ingestion may interfere with free T4 tests. If the results are inconsistent with the TSH level, previous test results, or the clinical presentation, then consider biotin interference. If needed, order repeat testing after stopping biotin. Performed at [facility], [place] [address] [place], [place] [address]   Ferritin     Status: None   Collection Time: 06/08/21  4:45 AM  Result Value Ref Range   Ferritin 55 11 - 307 ng/mL    Comment: Performed at [facility], [place] [address], [place], [place] [address]  Iron and TIBC     Status: Abnormal   Collection Time: 06/08/21  4:45 AM  Result Value Ref Range   Iron 22  (L) 28 - 170 ug/dL   TIBC 400 250 - 450 ug/dL   Saturation Ratios 5 (L) 10.4 - 31.8 %   UIBC 378 ug/dL    Comment: Performed at [facility], [place] [address], [place], [place] [address]  HIV Antibody (routine testing w rflx)     Status: None   Collection Time: 06/08/21  5:55 AM  Result Value Ref Range   HIV Screen 4th Generation wRfx Non Reactive Non Reactive    Comment: Performed at [facility], [place] [address] [place], [place] [address]    ECG   N/A  Telemetry   Sinus tachycardia - Personally Reviewed  Radiology    DG Chest 2 View  Result Date: 06/08/2021 CLINICAL DATA:  Chest pain, shortness of breath. EXAM: CHEST - 2 VIEW COMPARISON:  None. FINDINGS: The heart size and mediastinal contours are within normal limits. Both lungs are clear. A 4 mm nodular opacity is seen in the mid left lung. There is a 5 mm nodular opacity in the right upper lobe. No acute osseous abnormality. IMPRESSION: 1. No acute cardiopulmonary process. 2. Vague nodular opacities in the mid left lung and right upper lobe which may be due to overlapping structures. Comparison with older imaging studies or follow-up is recommended to exclude pulmonary nodule. Electronically Signed   By: Brett Fairy M.D.   On: 06/08/2021 00:46   ECHOCARDIOGRAM COMPLETE  Result Date: 06/08/2021    ECHOCARDIOGRAM REPORT   Patient Name:   ROCKELL ARBUTHNOT Date of Exam: 06/08/2021 Medical Rec #:  [id number]       Height:       68.0 in Accession #:    [id number]      Weight:       165.0 lb Date of Birth:  08/12/1975       BSA:          1.883 m Patient Age:    64 years        BP:           95/69 mmHg Patient Gender: F               HR:           95 bpm. Exam Location:  Inpatient Procedure: 2D Echo, Cardiac Doppler and Color Doppler Indications:    Cardiomyopathy  History:        Patient has no prior history of Echocardiogram examinations.  Sonographer:    Wenda Low Referring Phys: [id number] CHRISTOPHER A [place]   1. Left ventricular ejection fraction, by estimation, is 55 to 60%. The left ventricle has normal function. The left ventricle has no regional wall motion abnormalities. There is mild left ventricular hypertrophy. Left ventricular diastolic parameters were normal.  2. Right ventricular systolic function is normal. The right ventricular size is normal.  3. The mitral valve is normal in structure. No evidence of mitral valve regurgitation.  4. The aortic valve is tricuspid. Aortic valve regurgitation is not visualized.  5. The inferior vena cava is normal in size with greater than 50% respiratory variability, suggesting right atrial pressure of 3 mmHg. Comparison(s): No prior Echocardiogram. FINDINGS  Left Ventricle: Left ventricular ejection fraction, by estimation, is 55 to 60%. The left ventricle has normal function. The left ventricle has no regional wall motion abnormalities. The left ventricular internal cavity size was normal in size. There is  mild left ventricular hypertrophy. Left ventricular diastolic parameters were normal. Right Ventricle: The right ventricular size is normal. No increase in right ventricular wall thickness. Right ventricular systolic function is normal. Left Atrium: Left atrial size was normal in size. Right Atrium: Right atrial size was normal in size. Pericardium: There is no evidence of pericardial effusion. Mitral Valve: The mitral valve is normal in structure. No evidence of mitral valve regurgitation. MV peak gradient, 3.0 mmHg. The mean  mitral valve gradient is 1.0 mmHg. Tricuspid Valve: The tricuspid valve is grossly normal. Tricuspid valve regurgitation is trivial. Aortic Valve: The aortic valve is tricuspid. Aortic valve regurgitation is not visualized. Aortic valve mean gradient measures 3.0 mmHg. Aortic valve peak gradient measures 4.3 mmHg. Aortic valve area, by VTI measures 3.14 cm. Pulmonic Valve: The pulmonic valve was normal in structure. Pulmonic valve regurgitation  is not visualized. Aorta: The aortic root and ascending aorta are structurally normal, with no evidence of dilitation. Venous: The inferior vena cava is normal in size with greater than 50% respiratory variability, suggesting right atrial pressure of 3 mmHg. IAS/Shunts: No atrial level shunt detected by color flow Doppler.  LEFT VENTRICLE PLAX 2D LVIDd:         4.20 cm     Diastology LVIDs:         3.00 cm     LV e' medial:    8.32 cm/s LV PW:         1.00 cm     LV E/e' medial:  9.8 LV IVS:        1.20 cm     LV e' lateral:   11.60 cm/s LVOT diam:     2.10 cm     LV E/e' lateral: 7.0 LV SV:         61 LV SV Index:   32 LVOT Area:     3.46 cm  LV Volumes (MOD) LV vol d, MOD A2C: 89.0 ml LV vol d, MOD A4C: 82.2 ml LV vol s, MOD A2C: 38.2 ml LV vol s, MOD A4C: 40.2 ml LV SV MOD A2C:     50.8 ml LV SV MOD A4C:     82.2 ml LV SV MOD BP:      47.4 ml RIGHT VENTRICLE RV Basal diam:  3.20 cm RV Mid diam:    2.60 cm RV S prime:     11.20 cm/s TAPSE (M-mode): 2.4 cm LEFT ATRIUM             Index        RIGHT ATRIUM           Index LA diam:        3.50 cm 1.86 cm/m   RA Area:     14.20 cm LA Vol (A2C):   53.0 ml 28.14 ml/m  RA Volume:   37.00 ml  19.65 ml/m LA Vol (A4C):   56.0 ml 29.74 ml/m LA Biplane Vol: 54.9 ml 29.15 ml/m  AORTIC VALVE                    PULMONIC VALVE AV Area (Vmax):    3.36 cm     PV Vmax:       0.65 m/s AV Area (Vmean):   2.92 cm     PV Peak grad:  1.7 mmHg AV Area (VTI):     3.14 cm AV Vmax:           104.00 cm/s AV Vmean:          80.400 cm/s AV VTI:            0.194 m AV Peak Grad:      4.3 mmHg AV Mean Grad:      3.0 mmHg LVOT Vmax:         101.00 cm/s LVOT Vmean:        67.700 cm/s LVOT VTI:          0.176 m  LVOT/AV VTI ratio: 0.91  AORTA Ao Root diam: 2.90 cm MITRAL VALVE MV Area (PHT): 5.02 cm    SHUNTS MV Area VTI:   2.91 cm    Systemic VTI:  0.18 m MV Peak grad:  3.0 mmHg    Systemic Diam: 2.10 cm MV Mean grad:  1.0 mmHg MV Vmax:       0.86 m/s MV Vmean:      55.4 cm/s MV Decel  Time: 151 msec MV E velocity: 81.40 cm/s MV A velocity: 54.80 cm/s MV E/A ratio:  1.49 Lyman Bishop MD Electronically signed by Lyman Bishop MD Signature Date/Time: 06/08/2021/2:19:52 PM    Final    US PELVIC COMPLETE W TRANSVAGINAL AND TORSION R/O  Result Date: 06/08/2021 CLINICAL DATA:  Left lower quadrant abdominal pain.  Unknown LMP. EXAM: TRANSABDOMINAL AND TRANSVAGINAL ULTRASOUND OF PELVIS DOPPLER ULTRASOUND OF OVARIES TECHNIQUE: Both transabdominal and transvaginal ultrasound examinations of the pelvis were performed. Transabdominal technique was performed for global imaging of the pelvis including uterus, ovaries, adnexal regions, and pelvic cul-de-sac. It was necessary to proceed with endovaginal exam following the transabdominal exam to visualize the endometrium. Color and duplex Doppler ultrasound was utilized to evaluate blood flow to the ovaries. COMPARISON:  None. FINDINGS: Uterus Measurements: 9.7 x 4.8 x 6.8 cm = volume: 164 mL. No fibroids or other mass visualized. The uterus is anteverted. Nabothian cyst noted within the external cervical os. The cervix is otherwise unremarkable. Endometrium Thickness: 4 mm.  No focal abnormality visualized. Right ovary Measurements: 2.6 x 1.4 x 1.4 cm = volume: 3 mL. Normal appearance/no adnexal mass. Left ovary Measurements: 3.7 x 1.7 x 1.7 cm = volume: 6 mL. Normal appearance/no adnexal mass. Pulsed Doppler evaluation of both ovaries demonstrates normal low-resistance arterial and venous waveforms. Other findings No abnormal free fluid. IMPRESSION: Normal pelvic sonogram Electronically Signed   By: Fidela Salisbury M.D.   On: 06/08/2021 00:05    Cardiac Studies   Echo above  Assessment   Principal Problem:   Myopericarditis Active Problems:   Orthostatic hypotension   Plan   Noted to have persistent sinus tachy and low normal BP, unusual for her. Suspect she may have dehydration. Even though her BNP is elevated, there are no s/s of CHF -  echo with small IVC, normal LV filling pressure. Having headaches - may be dehydration. Will give 1L NS today. Recheck labs this am - CRP, BNP, BMET. Possible d/c later this afternoon - if not improved, ?cMRI.  Time Spent Directly with Patient:  I have spent a total of 35 minutes with the patient reviewing hospital notes, telemetry, EKGs, labs and examining the patient as well as establishing an assessment and plan that was discussed personally with the patient.  > 50% of time was spent in direct patient care.  Length of Stay:  LOS: 0 days   Pixie Casino, MD, [facility], [place] Director of the Advanced Lipid Disorders &  Cardiovascular Risk Reduction Clinic Diplomate of the American Board of Clinical Lipidology Attending Cardiologist  Direct Dial: [phone]  Fax: [phone]  Website:  [email or web address] 06/09/2021, 11:30 AM

## 2021-06-09 NOTE — Discharge Summary (Signed)
Discharge Summary    Patient ID: Judy Walsh MRN: [id number]; DOB: 01/14/1976  Admit date: 06/07/2021 Discharge date: 06/09/2021  PCP:  Merryl Hacker No   [facility] Providers Cardiologist:  Pixie Casino, MD        Discharge Diagnoses    Principal Problem:   Myopericarditis Active Problems:   Orthostatic hypotension   Menorrhagia   Chest x-ray abnormality    Diagnostic Studies/Procedures    Echocardiogram 06/08/2021 1. Left ventricular ejection fraction, by estimation, is 55 to 60%. The  left ventricle has normal function. The left ventricle has no regional  wall motion abnormalities. There is mild left ventricular hypertrophy.  Left ventricular diastolic parameters were normal.   2. Right ventricular systolic function is normal. The right ventricular  size is normal.   3. The mitral valve is normal in structure. No evidence of mitral valve  regurgitation.   4. The aortic valve is tricuspid. Aortic valve regurgitation is not  visualized.   5. The inferior vena cava is normal in size with greater than 50%  respiratory variability, suggesting right atrial pressure of 3 mmHg. _____________   History of Present Illness     Judy Walsh is a 45 y.o. female with no significant PMH who presented to the [facility] ED on 06/07/2021 with chest pain and shortness of breath.  She noted significant heavy menstrual bleeding recently.  She had some recent close contacts who had the flu. She developed fevers and chills a couple of days prior to presentation.  She started to notice chest pain with lying flat that would improve with sitting up and she was short of breath with any exertion.  Her initial hs-Trop was 128 and repeat was 122.  Her BNP was 811.  CRP was 11.6. She had no clinical evidence of congestive heart failure.  Her EKG had non-specific ST-TW changes but no ST elevation.  Her overall clinical picture seemed consistent with myopericarditis.  She was admitted for further  evaluation and management.       Hospital Course     Consultants: none    As noted she was admitted for further evaluation and management of probable myopericarditis.  As noted her Troponins were just mildly elevated without significant trend.  This was not c/w ACS.  Her echocardiogram showed normal systolic and diastolic function without wall motion abnormalities.  There was no pericardial effusion.  Her Hgb was normal.  Her Iron was low but ferritin was normal.  She was started on Ibuprofen and Colchicine. She was evaluated by Dr. Debara Pickett today and noted to have postural dizziness with low BP with sinus tachycardia.  It was thought she may be dehydrated causing orthostatic hypotension.  She was given IVFs with 1 L NS.  On repeat, her BNP and CRP were both trending down.  She was evaluated after finishing her IVFs and noted that she was feeling better.  She did not have any further dizziness with standing and continued to do well without recurrent chest pain.  Therefore, she is felt to be stable for DC to home.  She will f/u with Cardiology in 1 month.   Of note, her CXR in the ED showed no acute findings but there was a vague nodular opacity in the mid L lung and RUL that may be due to overlapping structures.  Comparison with old films or a f/u was recommended to rule out lung nodules. No old CXR's are in the chart.  She should discuss with  primary care to obtain a f/u CXR at some point in the next 3-4 weeks.  However, she does not have a PCP.  If she has not had a f/u CXR when she returns for f/u in our office it can be ordered at that time.    Did the patient have an acute coronary syndrome (MI, NSTEMI, STEMI, etc) this admission?:  No.   The elevated Troponin was due to the acute medical illness (demand ischemia).      _____________  Discharge Vitals Blood pressure 108/79, pulse 89, temperature 98.2 F (36.8 C), temperature source Oral, resp. rate 20, height $RemoveBe'5\' 8"'aqgBVkPYu$  (1.727 m), weight 74.8 kg,  last menstrual period 05/30/2021, SpO2 100 %.  Filed Weights   06/08/21 0736  Weight: 74.8 kg    Labs & Radiologic Studies    CBC Recent Labs    06/07/21 2244  WBC 9.0  NEUTROABS 7.1  HGB 12.7  HCT 36.8  MCV 89.3  PLT 204   Recent Labs    06/08/21 0445  IRON 22*  TIBC 400  IRONPCTSAT 5*  UIBC 378  FERRITIN 55     Basic Metabolic Panel Recent Labs    06/07/21 2244 06/09/21 1137  NA 133* 137  K 3.8 3.6  CL 101 104  CO2 23 25  GLUCOSE 120* 122*  BUN 10 9  CREATININE 0.73 0.68  CALCIUM 8.6* 8.7*   Liver Function Tests Recent Labs    06/07/21 2244 06/09/21 1137  AST 44* 49*  ALT 24 36  ALKPHOS 110 144*  BILITOT 0.5 0.5  PROT 6.2* 5.8*  ALBUMIN 3.2* 2.9*   Recent Labs    06/07/21 2244  LIPASE 24   High Sensitivity Troponin:   Recent Labs  Lab 06/07/21 2244 06/08/21 0053  TROPONINIHS 128* 122*    BNP Recent Labs    06/08/21 0212 06/09/21 1137  BNP 811.8* 551.8*    CRP Recent Labs    06/08/21 0211 06/09/21 1137  CRP 11.6* 7.6*    ESR Recent Labs    06/08/21 0211  ESRSEDRATE 20       Fasting Lipid Panel Recent Labs    06/08/21 0445  CHOL 139  HDL 54  LDLCALC 61  TRIG 121  CHOLHDL 2.6   Thyroid Function Tests Recent Labs    06/08/21 0445  TSH 1.317   _____________  DG Chest 2 View  Result Date: 06/08/2021 CLINICAL DATA:  Chest pain, shortness of breath. EXAM: CHEST - 2 VIEW COMPARISON:  None. FINDINGS: The heart size and mediastinal contours are within normal limits. Both lungs are clear. A 4 mm nodular opacity is seen in the mid left lung. There is a 5 mm nodular opacity in the right upper lobe. No acute osseous abnormality.  IMPRESSION: 1. No acute cardiopulmonary process. 2. Vague nodular opacities in the mid left lung and right upper lobe which may be due to overlapping structures. Comparison with older imaging studies or follow-up is recommended to exclude pulmonary nodule. Electronically Signed   By: Brett Fairy  M.D.   On: 06/08/2021 00:46    US PELVIC COMPLETE W TRANSVAGINAL AND TORSION R/O  Result Date: 06/08/2021 CLINICAL DATA:  Left lower quadrant abdominal pain.  Unknown LMP. EXAM: TRANSABDOMINAL AND TRANSVAGINAL ULTRASOUND OF PELVIS DOPPLER ULTRASOUND OF OVARIES TECHNIQUE: Both transabdominal and transvaginal ultrasound examinations of the pelvis were performed. Transabdominal technique was performed for global imaging of the pelvis including uterus, ovaries, adnexal regions, and pelvic cul-de-sac. It was necessary to  proceed with endovaginal exam following the transabdominal exam to visualize the endometrium. Color and duplex Doppler ultrasound was utilized to evaluate blood flow to the ovaries. COMPARISON:  None. FINDINGS: Uterus Measurements: 9.7 x 4.8 x 6.8 cm = volume: 164 mL. No fibroids or other mass visualized. The uterus is anteverted. Nabothian cyst noted within the external cervical os. The cervix is otherwise unremarkable. Endometrium Thickness: 4 mm.  No focal abnormality visualized. Right ovary Measurements: 2.6 x 1.4 x 1.4 cm = volume: 3 mL. Normal appearance/no adnexal mass. Left ovary Measurements: 3.7 x 1.7 x 1.7 cm = volume: 6 mL. Normal appearance/no adnexal mass. Pulsed Doppler evaluation of both ovaries demonstrates normal low-resistance arterial and venous waveforms. Other findings No abnormal free fluid.  IMPRESSION: Normal pelvic sonogram Electronically Signed   By: Fidela Salisbury M.D.   On: 06/08/2021 00:05   Disposition   Pt is being discharged home today in good condition.  Follow-up Plans & Appointments     Follow-up Information     Pixie Casino, MD Follow up in 1 month(s).   Specialty: Cardiology Why: The office will call you to arrange follow up with Dr. Dewitt Rota information: [address] [place] [id number] [phone]                Discharge Instructions     Diet - low sodium heart healthy   Complete by: As directed     Increase activity slowly   Complete by: As directed        Discharge Medications   Allergies as of 06/09/2021   No Known Allergies      Medication List     TAKE these medications    colchicine 0.6 MG tablet Take 1 tablet (0.6 mg total) by mouth 2 (two) times daily.   COLLAGEN PO Take 1 capsule by mouth daily.   famotidine 20 MG tablet Commonly known as: PEPCID Take 1 tablet (20 mg total) by mouth 2 (two) times daily. Take while you are taking Ibuprofen to protect your stomach   ibuprofen 200 MG tablet Commonly known as: ADVIL Take 4 tablets (800 mg total) by mouth every 8 (eight) hours. Reduce dose every 4 days by 200 mg (800 mg for 4 days, 600 mg for 4 days, 400 mg for 4 days, 200 mg for 4 days). Then stop taking. What changed:  when to take this reasons to take this additional instructions   Mili 0.25-35 MG-MCG tablet Generic drug: norgestimate-ethinyl estradiol Take 1 tablet by mouth daily.   OVER THE COUNTER MEDICATION Take 2 tablets by mouth in the morning and at bedtime. Nutriform vitamin   Womens Multi Gummies [employer] 2 tablets by mouth daily.           Outstanding Labs/Studies   ?F/u CXR in 1 month  Duration of Discharge Encounter   Greater than 30 minutes including physician time.  Signed, Richardson Dopp, PA-C 06/09/2021, 3:15 PM

## 2021-06-10 LAB — HEMOGLOBIN A1C
Hgb A1c MFr Bld: 4.8 % (ref 4.8–5.6)
Mean Plasma Glucose: 91 mg/dL

## 2021-06-12 NOTE — Progress Notes (Deleted)
Cardiology Office Note:    Date:  06/12/2021   ID:  Judy Walsh, DOB 1976-07-09, MRN [id number]  PCP:  Merryl Hacker, No  Cardiologist:  Pixie Casino, MD   Referring MD: No ref. provider found   No chief complaint on file. ***  History of Present Illness:    Judy Walsh is a 45 y.o. female with no significant past medical history presented to [facility] ED on 06/07/2021 with chest pain and shortness of breath.  She had been exposed to the flu and subsequently developed chest pain when lying flat that would improve when sitting up.  High-sensitivity troponin was 128 and 122.  BNP was 811.  CRP was elevated at 11.6.  EKG with nonspecific ST-T wave changes but no ST elevation.  Her clinical picture was consistent with myopericarditis and she was admitted for further evaluation and management.  Her cardiogram revealed normal systolic and diastolic function with no significant valvular disease or wall motion abnormalities.  There was no pericardial effusion.  On exam, she had postural dizziness, hypotension and sinus tachycardia.  She was felt to be dehydrated causing orthostatic hypotension.  She was treated with IVF's with improvement.  She was started on ibuprofen and colchicine.  It was noted that her chest x-ray in the ER showed no acute findings but a vague nodular opacity in the mid left lung and RUL.  She was instructed to discuss this with her PCP and obtain a follow-up chest x-ray in the next 3 to 4 weeks.  No PCP, we may need to order the follow-up chest x-ray.   Myopericarditis Maintained on ibuprofen and colchicine DC ibuprofen Continue colchicine x3 months   Nodular opacity on chest x-ray Will order follow-up CXR          Past Medical History:  Diagnosis Date   Chest x-ray abnormality 06/09/2021   CXR 11/22: Vague nodular opacities in the mid left lung and right upper lobe which may be due to overlapping structures. Comparison with older imaging studies or  follow-up is recommended to exclude pulmonary nodule.    Myopericarditis 06/08/2021   admx 11/22 >> hsTrop mildly elevated w/o trend; BNP and CRP high - trending down at DC // Echocardiogram 11/22: no effusion, EF 55-60, no RWMA, mild LVH >> NSAIDs/Colchicine    No past surgical history on file.  Current Medications: No outpatient medications have been marked as taking for the 06/14/21 encounter (Appointment) with Ledora Bottcher, [place].     Allergies:   Patient has no known allergies.   Social History   Socioeconomic History   Marital status: Single    Spouse name: Not on file   Number of children: Not on file   Years of education: Not on file   Highest education level: Not on file  Occupational History   Not on file  Tobacco Use   Smoking status: Never   Smokeless tobacco: Never  Vaping Use   Vaping Use: Never used  Substance and Sexual Activity   Alcohol use: Never   Drug use: Never   Sexual activity: Not on file  Other Topics Concern   Not on file  Social History Narrative   Not on file   Social Determinants of Health   Financial Resource Strain: Not on file  Food Insecurity: Not on file  Transportation Needs: Not on file  Physical Activity: Not on file  Stress: Not on file  Social Connections: Not on file     Family History:  The patient's ***family history includes CVA in her father.  ROS:   Please see the history of present illness.    *** All other systems reviewed and are negative.  EKGs/Labs/Other Studies Reviewed:    The following studies were reviewed today:  Echocardiogram 06/08/2021 1. Left ventricular ejection fraction, by estimation, is 55 to 60%. The  left ventricle has normal function. The left ventricle has no regional  wall motion abnormalities. There is mild left ventricular hypertrophy.  Left ventricular diastolic parameters were normal.   2. Right ventricular systolic function is normal. The right ventricular  size is normal.    3. The mitral valve is normal in structure. No evidence of mitral valve  regurgitation.   4. The aortic valve is tricuspid. Aortic valve regurgitation is not  visualized.   5. The inferior vena cava is normal in size with greater than 50%  respiratory variability, suggesting right atrial pressure of 3 mmHg. _____________  EKG:  EKG is *** ordered today.  The ekg ordered today demonstrates ***  Recent Labs: 06/07/2021: Hemoglobin 12.7; Platelets 204 06/08/2021: TSH 1.317 06/09/2021: ALT 36; B Natriuretic Peptide 551.8; BUN 9; Creatinine, Ser 0.68; Potassium 3.6; Sodium 137  Recent Lipid Panel    Component Value Date/Time   CHOL 139 06/08/2021 0445   TRIG 121 06/08/2021 0445   HDL 54 06/08/2021 0445   CHOLHDL 2.6 06/08/2021 0445   VLDL 24 06/08/2021 0445   LDLCALC 61 06/08/2021 0445    Physical Exam:    VS:  LMP 05/30/2021 Comment: pt shielded    Wt Readings from Last 3 Encounters:  06/08/21 165 lb (74.8 kg)     GEN: *** Well nourished, well developed in no acute distress HEENT: Normal NECK: No JVD; No carotid bruits LYMPHATICS: No lymphadenopathy CARDIAC: ***RRR, no murmurs, rubs, gallops RESPIRATORY:  Clear to auscultation without rales, wheezing or rhonchi  ABDOMEN: Soft, non-tender, non-distended MUSCULOSKELETAL:  No edema; No deformity  SKIN: Warm and dry NEUROLOGIC:  Alert and oriented x 3 PSYCHIATRIC:  Normal affect   ASSESSMENT:    No diagnosis found. PLAN:    In order of problems listed above:  No diagnosis found.   Medication Adjustments/Labs and Tests Ordered: Current medicines are reviewed at length with the patient today.  Concerns regarding medicines are outlined above.  No orders of the defined types were placed in this encounter.  No orders of the defined types were placed in this encounter.   Signed, Marcelino Duster, PA  06/12/2021 1:02 PM    [facility]

## 2021-06-14 ENCOUNTER — Other Ambulatory Visit: Payer: Self-pay

## 2021-06-14 ENCOUNTER — Encounter: Payer: Self-pay | Admitting: Physician Assistant

## 2021-06-14 ENCOUNTER — Ambulatory Visit (INDEPENDENT_AMBULATORY_CARE_PROVIDER_SITE_OTHER): Payer: 59 | Admitting: Nurse Practitioner

## 2021-06-14 VITALS — BP 112/82 | HR 73 | Ht 68.0 in | Wt 168.4 lb

## 2021-06-14 DIAGNOSIS — R9389 Abnormal findings on diagnostic imaging of other specified body structures: Secondary | ICD-10-CM

## 2021-06-14 DIAGNOSIS — I309 Acute pericarditis, unspecified: Secondary | ICD-10-CM | POA: Diagnosis not present

## 2021-06-14 NOTE — Progress Notes (Signed)
Cardiology Office Note:    Date:  06/14/2021   ID:  [name], DOB 09/27/1975, MRN [id number]  PCP:  Kathyrn Lass   [facility] Providers Cardiologist:  Pixie Casino, MD 1}    Referring MD: No ref. provider found   Chief Complaint: hospital f/u for myopericarditis  History of Present Illness:    Judy Walsh is a 45 y.o. female with a hx of menorrhagia and chest-xray abnormality who presented to the [facility] ED on 06/08/21 with abdominal pain and a 2-day history of weakness, fatigue, fever, and generally feeling poor. She was diagnosed at Urgent Care on 06/06/21 with URI and began to have abdominal and chest discomfort on 06/07/21 and was advised by her ob/gyn to go to the ED. She had negative Covid and flu tests. In the ED, she described chills, sweats, and sharp left-sided chest pain worse when laying down x 3 days. She had had exposure to the flu within the past 2 weeks.  Her hs troponin was elevated at 128 with downward trend to 122. She had no prior history of cardiac disease. Her CRP was 11.6 and sed rate was 20. EKG had non-specific ST-TW changes but no ST elevation. She was admitted for management of myopericarditis. Hospital course was uneventful with echo on 06/08/21 that showed no effusion, LVEF 55-60%, no RWMA, mild LVH. On CXR, there was  a vague nodular opacity in the mid left lung and RUL that may be due to overlapping structures. She was discharged on 06/10/11 and presents today for follow-up.   She is here alone today. She reports compliance with ibuprofen currently at 600 mg TID as prescribed by cardiology at d/c. She states she is feeling better but continues to have fatigue. She has mild chest discomfort with positional changes in the bed. She denies edema, worsening SOB, palpitations, dizziness, or syncope. On 2 occasions, she has had a headache behind her right eye. States on one occasion she realized she had not taken her ibuprofen dose.   Past Medical History:   Diagnosis Date   Chest x-ray abnormality 06/09/2021   CXR 11/22: Vague nodular opacities in the mid left lung and right upper lobe which may be due to overlapping structures. Comparison with older imaging studies or follow-up is recommended to exclude pulmonary nodule.    Myopericarditis 06/08/2021   admx 11/22 >> hsTrop mildly elevated w/o trend; BNP and CRP high - trending down at DC // Echocardiogram 11/22: no effusion, EF 55-60, no RWMA, mild LVH >> NSAIDs/Colchicine    No past surgical history on file.  Current Medications: Current Meds  Medication Sig   colchicine 0.6 MG tablet Take 1 tablet (0.6 mg total) by mouth 2 (two) times daily.   COLLAGEN PO Take 1 capsule by mouth daily.   famotidine (PEPCID) 20 MG tablet Take 1 tablet (20 mg total) by mouth 2 (two) times daily. Take while you are taking Ibuprofen to protect your stomach   ibuprofen (ADVIL) 200 MG tablet Take 4 tablets (800 mg total) by mouth every 8 (eight) hours. Reduce dose every 4 days by 200 mg (800 mg for 4 days, 600 mg for 4 days, 400 mg for 4 days, 200 mg for 4 days). Then stop taking.   MILI 0.25-35 MG-MCG tablet Take 1 tablet by mouth daily.   Multiple Vitamins-Minerals (WOMENS MULTI GUMMIES) CHEW Chew 2 tablets by mouth daily.   OVER THE COUNTER MEDICATION Take 2 tablets by mouth in the morning and at  bedtime. Nutriform vitamin     Allergies:   Patient has no known allergies.   Social History   Socioeconomic History   Marital status: Single    Spouse name: Not on file   Number of children: Not on file   Years of education: Not on file   Highest education level: Not on file  Occupational History   Not on file  Tobacco Use   Smoking status: Never   Smokeless tobacco: Never  Vaping Use   Vaping Use: Never used  Substance and Sexual Activity   Alcohol use: Never   Drug use: Never   Sexual activity: Not on file  Other Topics Concern   Not on file  Social History Narrative   Not on file   Social  Determinants of Health   Financial Resource Strain: Not on file  Food Insecurity: Not on file  Transportation Needs: Not on file  Physical Activity: Not on file  Stress: Not on file  Social Connections: Not on file     Family History: The patient's family history includes CVA in her father.  ROS:   Please see the history of present illness.  All other systems reviewed and are negative.  Labs/Other Studies Reviewed:    The following studies were reviewed today:  Echo 06/08/21  1. Left ventricular ejection fraction, by estimation, is 55 to 60%. The  left ventricle has normal function. The left ventricle has no regional  wall motion abnormalities. There is mild left ventricular hypertrophy.  Left ventricular diastolic parameters  were normal.   2. Right ventricular systolic function is normal. The right ventricular  size is normal.   3. The mitral valve is normal in structure. No evidence of mitral valve  regurgitation.   4. The aortic valve is tricuspid. Aortic valve regurgitation is not  visualized.   5. The inferior vena cava is normal in size with greater than 50%  respiratory variability, suggesting right atrial pressure of 3 mmHg.   Comparison(s): No prior Echocardiogram.   Recent Labs: 06/07/2021: Hemoglobin 12.7; Platelets 204 06/08/2021: TSH 1.317 06/09/2021: ALT 36; B Natriuretic Peptide 551.8; BUN 9; Creatinine, Ser 0.68; Potassium 3.6; Sodium 137   Recent Lipid Panel    Component Value Date/Time   CHOL 139 06/08/2021 0445   TRIG 121 06/08/2021 0445   HDL 54 06/08/2021 0445   CHOLHDL 2.6 06/08/2021 0445   VLDL 24 06/08/2021 0445   LDLCALC 61 06/08/2021 0445     Risk Assessment/Calculations:           Physical Exam:    VS:  BP 112/82   Pulse 73   Ht 5\' 8"  (1.727 m)   Wt 168 lb 6.4 oz (76.4 kg)   LMP 05/30/2021 Comment: pt shielded  SpO2 98%   BMI 25.61 kg/m     Wt Readings from Last 3 Encounters:  06/14/21 168 lb 6.4 oz (76.4 kg)   06/08/21 165 lb (74.8 kg)     GEN:  Well nourished, well developed in no acute distress HEENT: Normal NECK: No JVD; No carotid bruits LYMPHATICS: No lymphadenopathy CARDIAC: RRR, no murmurs, rubs, gallops RESPIRATORY:  Clear to auscultation without rales, wheezing or rhonchi  ABDOMEN: Soft, non-tender, non-distended MUSCULOSKELETAL:  No edema; No deformity  SKIN: Warm and dry NEUROLOGIC:  Alert and oriented x 3 PSYCHIATRIC:  Normal affect   EKG:  EKG is not ordered today.   Diagnoses:    1. Abnormal chest x-ray   2. Acute myopericarditis  Assessment and Plan:      1. Acute myopericarditis:  She appears well and feels that she is improving since hospital d/c although she continues to have fatigue. She is gradually increasing activity and denies chest pain, SOB, or edema. Continue colchicine for 3 months. Continue ibuprofen dose per taper schedule given in d/c instructions. Follow-up in 3 months with Dr. Rennis Golden when she has completed course of colchicine. BP stable. Call office sooner if you develop SOB, chest pain, edema or have other concerns.  2. Abnormal chest x-ray: Repeat CXR was advised following abnormal nodular opacities seen on CXR 06/08/21. Order placed for repeat and she was advised on procedure of going to [facility] for walk-in appointment. Encouraged her to establish primary care for further testing in the future and handouts with local PCPs given.     Disposition: 3 mo f/u with Dr. Rennis Golden   Medication Adjustments/Labs and Tests Ordered: Current medicines are reviewed at length with the patient today.  Concerns regarding medicines are outlined above.  Orders Placed This Encounter  Procedures   DG Chest 2 View   No orders of the defined types were placed in this encounter.   Patient Instructions  Medication Instructions:  Your physician recommends that you continue on your current medications as directed. Please refer to the Current Medication list  given to you today.  *If you need a refill on your cardiac medications before your next appointment, please call your pharmacy*  Lab Work: NONE ordered at this time of appointment   If you have labs (blood work) drawn today and your tests are completely normal, you will receive your results only by: MyChart Message (if you have MyChart) OR A paper copy in the mail If you have any lab test that is abnormal or we need to change your treatment, we will call you to review the results.  Testing/Procedures: A chest x-ray takes a picture of the organs and structures inside the chest, including the heart, lungs, and blood vessels. This test can show several things, including, whether the heart is enlarges; whether fluid is building up in the lungs; and whether pacemaker / defibrillator leads are still in place. This test is performed at [facility] [address]  To be completed in 1 month   Follow-Up: At [facility], you and your health needs are our priority.  As part of our continuing mission to provide you with exceptional heart care, we have created designated Provider Care Teams.  These Care Teams include your primary Cardiologist (physician) and Advanced Practice Providers (APPs -  Physician Assistants and Nurse Practitioners) who all work together to provide you with the care you need, when you need it.   We recommend signing up for the patient portal called "MyChart".  Sign up information is provided on this After Visit Summary.  MyChart is used to connect with patients for Virtual Visits (Telemedicine).  Patients are able to view lab/test results, encounter notes, upcoming appointments, etc.  Non-urgent messages can be sent to your provider as well.   To learn more about what you can do with MyChart, go to [email or web address].    Your next appointment:   As scheduled 09/06/21 at 9:30 AM   The format for your next appointment:   In Person  Provider:   Chrystie Nose, MD    Other Instructions Give our office a call prior to follow up appointment if you are experiencing worsen chest pain shortness of breath and swelling  of lower extremities    Signed, Emmaline Life, NP  06/14/2021 3:30 PM     [facility]

## 2021-06-14 NOTE — Patient Instructions (Addendum)
Medication Instructions:  Your physician recommends that you continue on your current medications as directed. Please refer to the Current Medication list given to you today.  *If you need a refill on your cardiac medications before your next appointment, please call your pharmacy*  Lab Work: NONE ordered at this time of appointment   If you have labs (blood work) drawn today and your tests are completely normal, you will receive your results only by: MyChart Message (if you have MyChart) OR A paper copy in the mail If you have any lab test that is abnormal or we need to change your treatment, we will call you to review the results.  Testing/Procedures: A chest x-ray takes a picture of the organs and structures inside the chest, including the heart, lungs, and blood vessels. This test can show several things, including, whether the heart is enlarges; whether fluid is building up in the lungs; and whether pacemaker / defibrillator leads are still in place. This test is performed at [facility] [address]  To be completed in 1 month   Follow-Up: At [facility], you and your health needs are our priority.  As part of our continuing mission to provide you with exceptional heart care, we have created designated Provider Care Teams.  These Care Teams include your primary Cardiologist (physician) and Advanced Practice Providers (APPs -  Physician Assistants and Nurse Practitioners) who all work together to provide you with the care you need, when you need it.   We recommend signing up for the patient portal called "MyChart".  Sign up information is provided on this After Visit Summary.  MyChart is used to connect with patients for Virtual Visits (Telemedicine).  Patients are able to view lab/test results, encounter notes, upcoming appointments, etc.  Non-urgent messages can be sent to your provider as well.   To learn more about what you can do with MyChart, go to  [email or web address].    Your next appointment:   As scheduled 09/06/21 at 9:30 AM   The format for your next appointment:   In Person  Provider:   Chrystie Nose, MD    Other Instructions Give our office a call prior to follow up appointment if you are experiencing worsen chest pain shortness of breath and swelling of lower extremities

## 2021-07-26 ENCOUNTER — Other Ambulatory Visit: Payer: Self-pay

## 2021-07-26 ENCOUNTER — Ambulatory Visit
Admission: RE | Admit: 2021-07-26 | Discharge: 2021-07-26 | Disposition: A | Discharge status: Home / Self Care | Payer: 59 | Source: Ambulatory Visit | Attending: Nurse Practitioner | Admitting: Nurse Practitioner

## 2021-07-26 DIAGNOSIS — R9389 Abnormal findings on diagnostic imaging of other specified body structures: Secondary | ICD-10-CM

## 2021-09-06 ENCOUNTER — Ambulatory Visit: Payer: 59 | Admitting: Internal Medicine

## 2022-01-06 ENCOUNTER — Emergency Department (HOSPITAL_COMMUNITY): Payer: 59

## 2022-01-06 ENCOUNTER — Encounter (HOSPITAL_COMMUNITY): Payer: Self-pay | Admitting: Emergency Medicine

## 2022-01-06 ENCOUNTER — Inpatient Hospital Stay (HOSPITAL_COMMUNITY): Payer: 59

## 2022-01-06 ENCOUNTER — Other Ambulatory Visit: Payer: Self-pay

## 2022-01-06 ENCOUNTER — Inpatient Hospital Stay (HOSPITAL_COMMUNITY)
Admission: EM | Admit: 2022-01-06 | Discharge: 2022-01-21 | DRG: 020 | Disposition: A | Discharge status: Rehabilitation Facility | Payer: 59 | Attending: Neurosurgery | Admitting: Neurosurgery

## 2022-01-06 DIAGNOSIS — J9601 Acute respiratory failure with hypoxia: Secondary | ICD-10-CM | POA: Diagnosis not present

## 2022-01-06 DIAGNOSIS — Z9181 History of falling: Secondary | ICD-10-CM

## 2022-01-06 DIAGNOSIS — Z72820 Sleep deprivation: Secondary | ICD-10-CM

## 2022-01-06 DIAGNOSIS — G9349 Other encephalopathy: Secondary | ICD-10-CM | POA: Diagnosis present

## 2022-01-06 DIAGNOSIS — Z781 Physical restraint status: Secondary | ICD-10-CM | POA: Diagnosis not present

## 2022-01-06 DIAGNOSIS — R1310 Dysphagia, unspecified: Secondary | ICD-10-CM | POA: Diagnosis present

## 2022-01-06 DIAGNOSIS — Z823 Family history of stroke: Secondary | ICD-10-CM

## 2022-01-06 DIAGNOSIS — F05 Delirium due to known physiological condition: Secondary | ICD-10-CM | POA: Diagnosis present

## 2022-01-06 DIAGNOSIS — E876 Hypokalemia: Secondary | ICD-10-CM | POA: Diagnosis present

## 2022-01-06 DIAGNOSIS — I10 Essential (primary) hypertension: Secondary | ICD-10-CM | POA: Diagnosis present

## 2022-01-06 DIAGNOSIS — D649 Anemia, unspecified: Secondary | ICD-10-CM | POA: Diagnosis not present

## 2022-01-06 DIAGNOSIS — R451 Restlessness and agitation: Secondary | ICD-10-CM | POA: Diagnosis not present

## 2022-01-06 DIAGNOSIS — Z87891 Personal history of nicotine dependence: Secondary | ICD-10-CM

## 2022-01-06 DIAGNOSIS — E8721 Acute metabolic acidosis: Secondary | ICD-10-CM | POA: Diagnosis present

## 2022-01-06 DIAGNOSIS — I609 Nontraumatic subarachnoid hemorrhage, unspecified: Secondary | ICD-10-CM | POA: Diagnosis not present

## 2022-01-06 DIAGNOSIS — I608 Other nontraumatic subarachnoid hemorrhage: Secondary | ICD-10-CM | POA: Diagnosis not present

## 2022-01-06 DIAGNOSIS — G479 Sleep disorder, unspecified: Secondary | ICD-10-CM | POA: Diagnosis not present

## 2022-01-06 DIAGNOSIS — R0603 Acute respiratory distress: Secondary | ICD-10-CM | POA: Diagnosis present

## 2022-01-06 DIAGNOSIS — K5900 Constipation, unspecified: Secondary | ICD-10-CM | POA: Diagnosis not present

## 2022-01-06 DIAGNOSIS — R739 Hyperglycemia, unspecified: Secondary | ICD-10-CM | POA: Diagnosis present

## 2022-01-06 DIAGNOSIS — R4701 Aphasia: Secondary | ICD-10-CM | POA: Diagnosis present

## 2022-01-06 DIAGNOSIS — R410 Disorientation, unspecified: Secondary | ICD-10-CM | POA: Diagnosis not present

## 2022-01-06 DIAGNOSIS — Z9911 Dependence on respirator [ventilator] status: Secondary | ICD-10-CM | POA: Diagnosis not present

## 2022-01-06 DIAGNOSIS — Z888 Allergy status to other drugs, medicaments and biological substances status: Secondary | ICD-10-CM

## 2022-01-06 DIAGNOSIS — Z20822 Contact with and (suspected) exposure to covid-19: Secondary | ICD-10-CM | POA: Diagnosis present

## 2022-01-06 DIAGNOSIS — R197 Diarrhea, unspecified: Secondary | ICD-10-CM | POA: Diagnosis not present

## 2022-01-06 DIAGNOSIS — I6032 Nontraumatic subarachnoid hemorrhage from left posterior communicating artery: Secondary | ICD-10-CM | POA: Diagnosis present

## 2022-01-06 DIAGNOSIS — E871 Hypo-osmolality and hyponatremia: Secondary | ICD-10-CM | POA: Diagnosis present

## 2022-01-06 DIAGNOSIS — I602 Nontraumatic subarachnoid hemorrhage from anterior communicating artery: Secondary | ICD-10-CM | POA: Diagnosis present

## 2022-01-06 DIAGNOSIS — G910 Communicating hydrocephalus: Secondary | ICD-10-CM | POA: Diagnosis present

## 2022-01-06 DIAGNOSIS — Z79899 Other long term (current) drug therapy: Secondary | ICD-10-CM

## 2022-01-06 DIAGNOSIS — I671 Cerebral aneurysm, nonruptured: Secondary | ICD-10-CM | POA: Diagnosis not present

## 2022-01-06 DIAGNOSIS — R519 Headache, unspecified: Secondary | ICD-10-CM | POA: Diagnosis not present

## 2022-01-06 DIAGNOSIS — R339 Retention of urine, unspecified: Secondary | ICD-10-CM | POA: Diagnosis not present

## 2022-01-06 DIAGNOSIS — K5903 Drug induced constipation: Secondary | ICD-10-CM | POA: Diagnosis not present

## 2022-01-06 DIAGNOSIS — G441 Vascular headache, not elsewhere classified: Secondary | ICD-10-CM | POA: Diagnosis not present

## 2022-01-06 DIAGNOSIS — G919 Hydrocephalus, unspecified: Secondary | ICD-10-CM | POA: Diagnosis present

## 2022-01-06 DIAGNOSIS — G936 Cerebral edema: Secondary | ICD-10-CM | POA: Diagnosis present

## 2022-01-06 DIAGNOSIS — R509 Fever, unspecified: Secondary | ICD-10-CM | POA: Diagnosis not present

## 2022-01-06 DIAGNOSIS — G9340 Encephalopathy, unspecified: Secondary | ICD-10-CM | POA: Diagnosis not present

## 2022-01-06 LAB — CBC WITH DIFFERENTIAL/PLATELET
Abs Immature Granulocytes: 0.03 10*3/uL (ref 0.00–0.07)
Basophils Absolute: 0.1 10*3/uL (ref 0.0–0.1)
Basophils Relative: 1 %
Eosinophils Absolute: 0.1 10*3/uL (ref 0.0–0.5)
Eosinophils Relative: 1 %
HCT: 39.3 % (ref 36.0–46.0)
Hemoglobin: 13.7 g/dL (ref 12.0–15.0)
Immature Granulocytes: 0 %
Lymphocytes Relative: 14 %
Lymphs Abs: 1.4 10*3/uL (ref 0.7–4.0)
MCH: 30.2 pg (ref 26.0–34.0)
MCHC: 34.9 g/dL (ref 30.0–36.0)
MCV: 86.6 fL (ref 80.0–100.0)
Monocytes Absolute: 0.7 10*3/uL (ref 0.1–1.0)
Monocytes Relative: 8 %
Neutro Abs: 7.5 10*3/uL (ref 1.7–7.7)
Neutrophils Relative %: 76 %
Platelets: 272 10*3/uL (ref 150–400)
RBC: 4.54 MIL/uL (ref 3.87–5.11)
RDW: 11.9 % (ref 11.5–15.5)
WBC: 9.8 10*3/uL (ref 4.0–10.5)
nRBC: 0 % (ref 0.0–0.2)

## 2022-01-06 LAB — URINALYSIS, ROUTINE W REFLEX MICROSCOPIC
Bilirubin Urine: NEGATIVE
Glucose, UA: 50 mg/dL — AB
Hgb urine dipstick: NEGATIVE
Ketones, ur: 20 mg/dL — AB
Leukocytes,Ua: NEGATIVE
Nitrite: NEGATIVE
Protein, ur: NEGATIVE mg/dL
Specific Gravity, Urine: 1.033 — ABNORMAL HIGH (ref 1.005–1.030)
pH: 8 (ref 5.0–8.0)

## 2022-01-06 LAB — POCT I-STAT 7, (LYTES, BLD GAS, ICA,H+H)
Acid-Base Excess: 3 mmol/L — ABNORMAL HIGH (ref 0.0–2.0)
Bicarbonate: 25.6 mmol/L (ref 20.0–28.0)
Calcium, Ion: 1.08 mmol/L — ABNORMAL LOW (ref 1.15–1.40)
HCT: 41 % (ref 36.0–46.0)
Hemoglobin: 13.9 g/dL (ref 12.0–15.0)
O2 Saturation: 100 %
Patient temperature: 36.4
Potassium: 3.4 mmol/L — ABNORMAL LOW (ref 3.5–5.1)
Sodium: 131 mmol/L — ABNORMAL LOW (ref 135–145)
TCO2: 27 mmol/L (ref 22–32)
pCO2 arterial: 32.3 mmHg (ref 32–48)
pH, Arterial: 7.505 — ABNORMAL HIGH (ref 7.35–7.45)
pO2, Arterial: 454 mmHg — ABNORMAL HIGH (ref 83–108)

## 2022-01-06 LAB — COMPREHENSIVE METABOLIC PANEL
ALT: 15 U/L (ref 0–44)
AST: 24 U/L (ref 15–41)
Albumin: 3.8 g/dL (ref 3.5–5.0)
Alkaline Phosphatase: 35 U/L — ABNORMAL LOW (ref 38–126)
Anion gap: 10 (ref 5–15)
BUN: 10 mg/dL (ref 6–20)
CO2: 21 mmol/L — ABNORMAL LOW (ref 22–32)
Calcium: 7.9 mg/dL — ABNORMAL LOW (ref 8.9–10.3)
Chloride: 107 mmol/L (ref 98–111)
Creatinine, Ser: 0.65 mg/dL (ref 0.44–1.00)
GFR, Estimated: >60 mL/min (ref >60)
Glucose, Bld: 128 mg/dL — ABNORMAL HIGH (ref 70–99)
Potassium: 2.7 mmol/L — CL (ref 3.5–5.1)
Sodium: 138 mmol/L (ref 135–145)
Total Bilirubin: 0.5 mg/dL (ref 0.3–1.2)
Total Protein: 6.5 g/dL (ref 6.5–8.1)

## 2022-01-06 LAB — RAPID URINE DRUG SCREEN, HOSP PERFORMED
Amphetamines: NOT DETECTED
Barbiturates: NOT DETECTED
Benzodiazepines: NOT DETECTED
Cocaine: NOT DETECTED
Opiates: NOT DETECTED
Tetrahydrocannabinol: NOT DETECTED

## 2022-01-06 LAB — I-STAT BETA HCG BLOOD, ED (MC, WL, AP ONLY): I-stat hCG, quantitative: <5 m[IU]/mL (ref <5)

## 2022-01-06 LAB — I-STAT CHEM 8, ED
BUN: 8 mg/dL (ref 6–20)
Calcium, Ion: 1.1 mmol/L — ABNORMAL LOW (ref 1.15–1.40)
Chloride: 100 mmol/L (ref 98–111)
Creatinine, Ser: 0.6 mg/dL (ref 0.44–1.00)
Glucose, Bld: 135 mg/dL — ABNORMAL HIGH (ref 70–99)
HCT: 40 % (ref 36.0–46.0)
Hemoglobin: 13.6 g/dL (ref 12.0–15.0)
Potassium: 3 mmol/L — ABNORMAL LOW (ref 3.5–5.1)
Sodium: 137 mmol/L (ref 135–145)
TCO2: 22 mmol/L (ref 22–32)

## 2022-01-06 LAB — ETHANOL: Alcohol, Ethyl (B): <10 mg/dL (ref <10)

## 2022-01-06 LAB — PROTIME-INR
INR: 1.1 (ref 0.8–1.2)
Prothrombin Time: 14.1 seconds (ref 11.4–15.2)

## 2022-01-06 LAB — RESP PANEL BY RT-PCR (FLU A&B, COVID) ARPGX2
Influenza A by PCR: NEGATIVE
Influenza B by PCR: NEGATIVE
SARS Coronavirus 2 by RT PCR: NEGATIVE

## 2022-01-06 LAB — APTT: aPTT: 25 seconds (ref 24–36)

## 2022-01-06 MED ORDER — ONDANSETRON HCL 4 MG/2ML IJ SOLN
4.0000 mg | Freq: Four times a day (QID) | INTRAMUSCULAR | Status: DC | PRN
  Administered 2022-01-08: 4 mg via INTRAVENOUS
  Filled 2022-01-06: qty 2

## 2022-01-06 MED ORDER — ONDANSETRON 4 MG PO TBDP: 4.0000 mg | ORAL_TABLET | Freq: Four times a day (QID) | ORAL | Status: DC | PRN

## 2022-01-06 MED ORDER — PANTOPRAZOLE 2 MG/ML SUSPENSION
40.0000 mg | Freq: Every day | ORAL | Status: DC
  Administered 2022-01-08: 40 mg
  Filled 2022-01-06 (×2): qty 20

## 2022-01-06 MED ORDER — STROKE: EARLY STAGES OF RECOVERY BOOK
Freq: Once | Status: AC
  Filled 2022-01-06: qty 1

## 2022-01-06 MED ORDER — ROCURONIUM BROMIDE 10 MG/ML (PF) SYRINGE
PREFILLED_SYRINGE | INTRAVENOUS | Status: AC
  Administered 2022-01-06: 100 mg via INTRAVENOUS
  Filled 2022-01-06: qty 10

## 2022-01-06 MED ORDER — ETOMIDATE 2 MG/ML IV SOLN: 20.0000 mg | Freq: Once | INTRAVENOUS | Status: AC

## 2022-01-06 MED ORDER — ACETAMINOPHEN 650 MG RE SUPP
650.0000 mg | RECTAL | Status: DC | PRN
  Administered 2022-01-08: 650 mg via RECTAL
  Filled 2022-01-06: qty 1

## 2022-01-06 MED ORDER — PROPOFOL 1000 MG/100ML IV EMUL
0.0000 ug/kg/min | INTRAVENOUS | Status: DC
  Administered 2022-01-06: 5 ug/kg/min via INTRAVENOUS
  Administered 2022-01-07: 30 ug/kg/min via INTRAVENOUS
  Administered 2022-01-07: 40 ug/kg/min via INTRAVENOUS
  Administered 2022-01-07: 25 ug/kg/min via INTRAVENOUS
  Administered 2022-01-07: 40 ug/kg/min via INTRAVENOUS
  Administered 2022-01-08: 25 ug/kg/min via INTRAVENOUS
  Filled 2022-01-06 (×7): qty 100

## 2022-01-06 MED ORDER — ACETAMINOPHEN 160 MG/5ML PO SOLN
650.0000 mg | ORAL | Status: DC | PRN
  Administered 2022-01-07 – 2022-01-14 (×13): 650 mg
  Filled 2022-01-06 (×12): qty 20.3

## 2022-01-06 MED ORDER — CLEVIDIPINE BUTYRATE 0.5 MG/ML IV EMUL: 0.0000 mg/h | INTRAVENOUS | Status: DC

## 2022-01-06 MED ORDER — HYDROMORPHONE HCL 1 MG/ML IJ SOLN: 1.0000 mg | INTRAMUSCULAR | Status: DC | PRN

## 2022-01-06 MED ORDER — ACETAMINOPHEN 325 MG PO TABS
650.0000 mg | ORAL_TABLET | ORAL | Status: DC | PRN
  Administered 2022-01-13: 650 mg via ORAL
  Filled 2022-01-06: qty 2

## 2022-01-06 MED ORDER — OXYCODONE-ACETAMINOPHEN 5-325 MG PO TABS: 1.0000 | ORAL_TABLET | ORAL | Status: DC | PRN

## 2022-01-06 MED ORDER — ETOMIDATE 2 MG/ML IV SOLN
INTRAVENOUS | Status: AC
  Administered 2022-01-06: 20 mg via INTRAVENOUS
  Filled 2022-01-06: qty 20

## 2022-01-06 MED ORDER — LABETALOL HCL 5 MG/ML IV SOLN: 20.0000 mg | Freq: Once | INTRAVENOUS | Status: DC

## 2022-01-06 MED ORDER — FENTANYL CITRATE PF 50 MCG/ML IJ SOSY
50.0000 ug | PREFILLED_SYRINGE | INTRAMUSCULAR | Status: DC | PRN
  Administered 2022-01-06 – 2022-01-07 (×5): 100 ug via INTRAVENOUS
  Administered 2022-01-07: 50 ug via INTRAVENOUS
  Administered 2022-01-07 (×3): 100 ug via INTRAVENOUS
  Administered 2022-01-07: 50 ug via INTRAVENOUS
  Administered 2022-01-07: 100 ug via INTRAVENOUS
  Administered 2022-01-07: 50 ug via INTRAVENOUS
  Filled 2022-01-06: qty 1
  Filled 2022-01-06 (×3): qty 2
  Filled 2022-01-06: qty 1
  Filled 2022-01-06: qty 2
  Filled 2022-01-06: qty 4
  Filled 2022-01-06 (×2): qty 2

## 2022-01-06 MED ORDER — ROCURONIUM BROMIDE 50 MG/5ML IV SOLN
100.0000 mg | Freq: Once | INTRAVENOUS | Status: AC
  Filled 2022-01-06: qty 10

## 2022-01-06 MED ORDER — CLEVIDIPINE BUTYRATE 0.5 MG/ML IV EMUL
0.0000 mg/h | INTRAVENOUS | Status: DC
  Administered 2022-01-06: 4 mg/h via INTRAVENOUS
  Administered 2022-01-06: 2 mg/h via INTRAVENOUS
  Filled 2022-01-06 (×2): qty 50

## 2022-01-06 MED ORDER — ONDANSETRON HCL 4 MG/2ML IJ SOLN
4.0000 mg | Freq: Once | INTRAMUSCULAR | Status: AC
  Administered 2022-01-06: 4 mg via INTRAVENOUS
  Filled 2022-01-06: qty 2

## 2022-01-06 MED ORDER — FENTANYL CITRATE PF 50 MCG/ML IJ SOSY
50.0000 ug | PREFILLED_SYRINGE | Freq: Once | INTRAMUSCULAR | Status: AC
  Administered 2022-01-06: 50 ug via INTRAVENOUS
  Filled 2022-01-06: qty 1

## 2022-01-06 MED ORDER — NIMODIPINE 6 MG/ML PO SOLN
60.0000 mg | ORAL | Status: DC
  Administered 2022-01-07 – 2022-01-14 (×43): 60 mg
  Filled 2022-01-06 (×44): qty 10

## 2022-01-06 MED ORDER — FENTANYL CITRATE PF 50 MCG/ML IJ SOSY
100.0000 ug | PREFILLED_SYRINGE | Freq: Once | INTRAMUSCULAR | Status: AC
  Administered 2022-01-06: 100 ug via INTRAVENOUS
  Filled 2022-01-06: qty 2

## 2022-01-06 MED ORDER — IOHEXOL 350 MG/ML SOLN
75.0000 mL | Freq: Once | INTRAVENOUS | Status: AC | PRN
  Administered 2022-01-06: 75 mL via INTRAVENOUS

## 2022-01-06 MED ORDER — SODIUM CHLORIDE 0.9 % IV SOLN: INTRAVENOUS | Status: DC

## 2022-01-06 MED ORDER — LEVETIRACETAM IN NACL 1000 MG/100ML IV SOLN
1000.0000 mg | Freq: Once | INTRAVENOUS | Status: AC
  Administered 2022-01-06: 1000 mg via INTRAVENOUS
  Filled 2022-01-06: qty 100

## 2022-01-06 MED ORDER — DOCUSATE SODIUM 50 MG/5ML PO LIQD: 100.0000 mg | Freq: Two times a day (BID) | ORAL | Status: DC

## 2022-01-06 MED ORDER — LABETALOL HCL 5 MG/ML IV SOLN
20.0000 mg | Freq: Once | INTRAVENOUS | Status: AC
  Administered 2022-01-06: 20 mg via INTRAVENOUS
  Filled 2022-01-06: qty 4

## 2022-01-06 MED ORDER — SODIUM CHLORIDE 0.9 % IV SOLN
2.0000 g | Freq: Every day | INTRAVENOUS | Status: DC
  Administered 2022-01-07 – 2022-01-09 (×4): 2 g via INTRAVENOUS
  Filled 2022-01-06 (×4): qty 20

## 2022-01-06 MED ORDER — LEVETIRACETAM IN NACL 500 MG/100ML IV SOLN
500.0000 mg | Freq: Two times a day (BID) | INTRAVENOUS | Status: DC
  Administered 2022-01-07 – 2022-01-09 (×5): 500 mg via INTRAVENOUS
  Filled 2022-01-06 (×5): qty 100

## 2022-01-06 MED ORDER — DOCUSATE SODIUM 50 MG/5ML PO LIQD
100.0000 mg | Freq: Two times a day (BID) | ORAL | Status: DC
  Administered 2022-01-07 – 2022-01-12 (×11): 100 mg
  Filled 2022-01-06 (×13): qty 10

## 2022-01-06 MED ORDER — LEVETIRACETAM IN NACL 1000 MG/100ML IV SOLN
1000.0000 mg | Freq: Once | INTRAVENOUS | Status: DC
  Filled 2022-01-06: qty 100

## 2022-01-06 MED ORDER — NIMODIPINE 30 MG PO CAPS
60.0000 mg | ORAL_CAPSULE | ORAL | Status: DC
  Administered 2022-01-10 – 2022-01-14 (×3): 60 mg via ORAL
  Filled 2022-01-06 (×5): qty 2

## 2022-01-06 MED ORDER — FENTANYL CITRATE PF 50 MCG/ML IJ SOSY
50.0000 ug | PREFILLED_SYRINGE | INTRAMUSCULAR | Status: DC | PRN
  Filled 2022-01-06: qty 1

## 2022-01-06 MED ORDER — PANTOPRAZOLE SODIUM 40 MG PO TBEC: 40.0000 mg | DELAYED_RELEASE_TABLET | Freq: Every day | ORAL | Status: DC

## 2022-01-06 MED ORDER — OXYCODONE-ACETAMINOPHEN 5-325 MG PO TABS
1.0000 | ORAL_TABLET | ORAL | Status: DC | PRN
  Administered 2022-01-07 – 2022-01-10 (×7): 1
  Filled 2022-01-06 (×8): qty 1

## 2022-01-06 NOTE — Progress Notes (Signed)
Patient ID: Judy Walsh, female   DOB: [birth year], 46 y.o.   MRN: [id number] Patient with decline in mental status needed to be intubated repeat CT scan showed rebleed subarachnoid hemorrhage and worsening hydrocephalus.  Patient was emergently transferred to [facility] and I extensively discussed with her son placement of a external ventricular drain that this was an emergent needed procedure went over the risks and benefits perioperative course expectations of outcome and alternatives and he understood and agreed to proceed forward.

## 2022-01-06 NOTE — Procedures (Signed)
Risks and benefits were discussed with the patient and family at bedside. Consent obtained. Initial insertion site was marked midpupilary line just behind the hairline on the right. Patient was prepped and draped in sterile fashion. 20 of propofol was bolused to the patient. Vital signs stable after administration of propofol and patient resting comfortably. 70ml of Lidocaine was used for local anesthetic. Incision with a scalpel was made at the insertion point that was already determined. Hand drill was then used to make a small craniotomy. Dura was felt and then punctured with a needle. Catheter was inserted and advanced until CSF return was seen. Advanced the catheter to 88mm. CSF flow still present. Catheter was then tunneled through a separate insertion site and sutured down securely with nylon suture. Initial incision was suture closed with nylon as well. CSF flow through catheter still patent. Catheter was connected to external drainage system and placed at 22mm of H2O. Sterile dressing applied. Patient tolerated the procedure well and vital signs were stable throughout.

## 2022-01-06 NOTE — ED Notes (Signed)
Family called out aprox 9pm and states she was agitated and rips off her monitoring equipment and was upset. Went into room and the pt was calm and asleep and provider notified. When attempted to arouse pt she would look at [place] but not respond.   Inocencio Homes, EMTP

## 2022-01-06 NOTE — H&P (Signed)
Reason for Consult:sah Referring Physician: edp  Judy Walsh is an 46 y.o. female.   HPI:  46 year old female comes in today with sudden onset of severe headache as well as nausea and vomiting.  She is currently laying in bed with a 10 out of 10 headache.  She answers questions appropriately with some mild lethargy although she did just get fentanyl.  Her father is at the bedside.  Father states that she has been having problems with headaches recently.  She lives at home with her 39 year old daughter.  Past Medical History:  Diagnosis Date   Chest x-ray abnormality 06/09/2021   CXR 11/22: Vague nodular opacities in the mid left lung and right upper lobe which may be due to overlapping structures. Comparison with older imaging studies or follow-up is recommended to exclude pulmonary nodule.    Myopericarditis 06/08/2021   admx 11/22 >> hsTrop mildly elevated w/o trend; BNP and CRP high - trending down at DC // Echocardiogram 11/22: no effusion, EF 55-60, no RWMA, mild LVH >> NSAIDs/Colchicine    History reviewed. No pertinent surgical history.  No Known Allergies  Social History   Tobacco Use   Smoking status: Never   Smokeless tobacco: Never  Substance Use Topics   Alcohol use: Never    Family History  Problem Relation Age of Onset   CVA Father      Review of Systems  Positive ROS: As above  All other systems have been reviewed and were otherwise negative with the exception of those mentioned in the HPI and as above.  Objective: Vital signs in last 24 hours: Temp:  [98.2 F (36.8 C)] 98.2 F (36.8 C) (06/12 1653) Pulse Rate:  [51-82] 67 (06/12 1900) Resp:  [15-30] 18 (06/12 1845) BP: (126-177)/(80-125) 148/89 (06/12 1900) SpO2:  [85 %-100 %] 85 % (06/12 1900) Weight:  [76.2 kg] 76.2 kg (06/12 1644)  General Appearance: Alert, cooperative, no distress, appears stated age Head: Normocephalic, without obvious abnormality, atraumatic Eyes: PERRL, conjunctiva/corneas  clear, EOM's intact, fundi benign, both eyes      Neck: Supple, symmetrical, trachea midline, no adenopathy; thyroid: No enlargement/tenderness/nodules; no carotid bruit or JVD Lungs:  respirations unlabored Heart: Regular rate and rhythm Pulses: 2+ and symmetric all extremities Skin: Skin color, texture, turgor normal, no rashes or lesions  NEUROLOGIC:   Mental status: A&O x3, no aphasia, good attention span, Memory and fund of knowledge Motor Exam - grossly normal, normal tone and bulk Sensory Exam - grossly normal Reflexes: symmetric, no pathologic reflexes, No Hoffman's, No clonus Coordination - grossly normal Gait -not tested Balance -not tested Cranial Nerves: I: smell Not tested  II: visual acuity  OS: na    OD: na  II: visual fields Full to confrontation  II: pupils Equal, round, reactive to light  III,VII: ptosis None  III,IV,VI: extraocular muscles  Full ROM  V: mastication Normal  V: facial light touch sensation  Normal  V,VII: corneal reflex  Present  VII: facial muscle function - upper  Normal  VII: facial muscle function - lower Normal  VIII: hearing Not tested  IX: soft palate elevation  Normal  IX,X: gag reflex Present  XI: trapezius strength  5/5  XI: sternocleidomastoid strength 5/5  XI: neck flexion strength  5/5  XII: tongue strength  Normal    Data Review Lab Results  Component Value Date   WBC 9.8 01/06/2022   HGB 13.6 01/06/2022   HCT 40.0 01/06/2022   MCV 86.6 01/06/2022  PLT 272 01/06/2022   Lab Results  Component Value Date   NA 137 01/06/2022   K 3.0 (L) 01/06/2022   CL 100 01/06/2022   CO2 21 (L) 01/06/2022   BUN 8 01/06/2022   CREATININE 0.60 01/06/2022   GLUCOSE 135 (H) 01/06/2022   No results found for: "INR", "PROTIME"  Radiology: CT ANGIO HEAD W OR WO CONTRAST  Result Date: 01/06/2022 CLINICAL DATA:  Subarachnoid hemorrhage on same day CT head. Evaluate for aneurysm. EXAM: CT ANGIOGRAPHY HEAD TECHNIQUE: Multidetector CT  imaging of the head was performed using the standard protocol during bolus administration of intravenous contrast. Multiplanar CT image reconstructions and MIPs were obtained to evaluate the vascular anatomy. RADIATION DOSE REDUCTION: This exam was performed according to the departmental dose-optimization program which includes automated exposure control, adjustment of the mA and/or kV according to patient size and/or use of iterative reconstruction technique. CONTRAST:  54mL OMNIPAQUE IOHEXOL 350 MG/ML SOLN COMPARISON:  Same day CT head. FINDINGS: CTA HEAD Anterior circulation: Bilateral intracranial ICAs, MCAs, and ACAs are patent without proximal hemodynamically significant stenosis. Irregular, lobulated 5 x 4 mm outpouching rising from the left supraclinoid ICA (see series 5, image 38). 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 Posterior circulation: Bilateral intradural vertebral arteries, basilar artery, and bilateral posterior cerebral arteries are patent without proximal hemodynamically significant stenosis. Right fetal type PCA, anatomic variant. Venous sinuses: As permitted by contrast timing, patent. Review of the MIP images confirms the above findings. IMPRESSION: 1. Irregular, lobulated 5 x 4 mm outpouching rising from the left supraclinoid ICA, concerning for aneurysm that probably is ruptured given irregular morphology and subarachnoid hemorrhage seen on same day CT head. 2. 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 3. No emergent large vessel occlusion or proximal hemodynamically significant stenosis. Findings discussed with Dr. Alvino Chapel via telephone at 5:55 p.m. Electronically Signed   By: Margaretha Sheffield M.D.   On: 01/06/2022 18:06   CT HEAD WO CONTRAST (5MM)  Result Date: 01/06/2022 CLINICAL DATA:  Headache, sudden, severe EXAM: CT HEAD WITHOUT CONTRAST TECHNIQUE: Contiguous axial images were obtained from the  base of the skull through the vertex without intravenous contrast. RADIATION DOSE REDUCTION: This exam was performed according to the departmental dose-optimization program which includes automated exposure control, adjustment of the mA and/or kV according to patient size and/or use of iterative reconstruction technique. COMPARISON:  None Available. FINDINGS: Brain: Moderate volume of subarachnoid hemorrhage with hemorrhage along the interhemispheric falx, suprasellar cistern, sylvian fissures and basal cisterns. No evidence of acute large vascular territory infarct, mass lesion, or midline shift. Borderline ventriculomegaly. Vascular: Limited assessment for hyperdense vessel due to the subarachnoid hemorrhage. Skull: No acute fracture. Sinuses/Orbits: Clear sinuses.  No acute orbital findings. Other: No mastoid effusions. IMPRESSION: 1. Moderate volume of acute subarachnoid hemorrhage, described above. Recommend CTA to evaluate for aneurysm. 2. Borderline ventriculomegaly. Recommend continued attention on follow-up to exclude developing hydrocephalus. Critical findings discussed with Dr. Alvino Chapel via telephone at 5:40 p.m. Electronically Signed   By: Margaretha Sheffield M.D.   On: 01/06/2022 17:43     Assessment/Plan: 45 year old female comes in today with sudden onset severe headaches.  CT head showed a moderate amount of acute subarachnoid hemorrhage along the falx suprasellar cistern, sylvian fissures and basal cisterns.  CTA showed an outpouching of the supraclinoid ICA consistent with an aneurysm rupture.  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  She  does have mild enlargement of her ventricles but no hydrocephalus.  Therefore, we will hold off on an EVD.  Repeat head CT in the morning. we will admit her to Dr. Cleotilde Neer service in the ICU at [facility].  Continue neuro checks and please call with any change in neuro status.    Ocie Cornfield [facility] 01/06/2022 7:08 PM

## 2022-01-06 NOTE — ED Triage Notes (Signed)
Per [facility] pt coming from work c/o sudden onset of headache between eyes going into her neck with nausea and vomiting.

## 2022-01-06 NOTE — Progress Notes (Signed)
Pt moving during procedure. 100 mcg of fentanyl was given by this RN. Pt continued to move. Bolus of propofol 30 mcg was given by MD. Procedure still underway, VS stable at this time.   Royal Piedra, RN

## 2022-01-06 NOTE — ED Notes (Signed)
Consult to Neuro Surgery@21 :10pm.

## 2022-01-06 NOTE — ED Provider Triage Note (Signed)
Emergency Medicine Provider Triage Evaluation Note  Judy Walsh , a 46 y.o. female  was evaluated in triage.  Pt complains of headache.  States she had a sudden onset headache in between her eyes.  States she had blurred vision.  She now has pain extending from her occipital region to the posterior aspect of her neck.  No fever.  Was well up until her headache started.  States it felt like a "bomb."  No prior history of aneurysm, dissection, elevated blood pressure. Sx started 1.5 PTA.  No numbness, weakness, difficulty with word finding  Review of Systems  Positive: Headache, neck pain Negative:   Physical Exam  Ht 5\' 8"  (1.727 m)   Wt 76.2 kg   SpO2 97%   BMI 25.54 kg/m  Gen:   Awake, no distress   Resp:  Normal effort  MSK:   Moves extremities without difficulty  Neuro:  Patient with difficulty with compliance with exam.  No facial droop, intact sensation, equal handgrip, equal finger-to-nose Other:    Medical Decision Making  Medically screening exam initiated at 4:48 PM.  Appropriate orders placed.  Judy Walsh was informed that the remainder of the evaluation will be completed by another provider, this initial triage assessment does not replace that evaluation, and the importance of remaining in the ED until their evaluation is complete.  Headache, neck pain  Nursing aware patient needs room in back   Krisna Omar A, PA-C 01/06/22 1650

## 2022-01-06 NOTE — ED Notes (Signed)
Attempted to do swallow and stroke screen with pt and assess pt but pt is sleeping peacefully. Made intro to family. Will go back in and try in a few for the swallow test and stroke screen.

## 2022-01-06 NOTE — ED Provider Notes (Signed)
[facility] DEPT Provider Note   CSN: [id number] Arrival date & time: 01/06/22  1630     History  Chief Complaint  Patient presents with   Headache    Judy Walsh is a 46 y.o. female.   Headache Patient presented cute onset of headache.  Severe.  From the eyes.  Some nausea.  Light bothers her some.  Does not tend to get headaches.  Started around an hour and half prior to arrival.  Pain is severe and somewhat difficulty discussing because of it.  Has not had issues like this before.  States she is otherwise healthy.    Past Medical History:  Diagnosis Date   Chest x-ray abnormality 06/09/2021   CXR 11/22: Vague nodular opacities in the mid left lung and right upper lobe which may be due to overlapping structures. Comparison with older imaging studies or follow-up is recommended to exclude pulmonary nodule.    Myopericarditis 06/08/2021   admx 11/22 >> hsTrop mildly elevated w/o trend; BNP and CRP high - trending down at DC // Echocardiogram 11/22: no effusion, EF 55-60, no RWMA, mild LVH >> NSAIDs/Colchicine    Home Medications Prior to Admission medications   Medication Sig Start Date End Date Taking? Authorizing Provider  colchicine 0.6 MG tablet Take 1 tablet (0.6 mg total) by mouth 2 (two) times daily. 06/09/21 09/07/21  Richardson Dopp T, PA-C  COLLAGEN PO Take 1 capsule by mouth daily.    [provider]  famotidine (PEPCID) 20 MG tablet Take 1 tablet (20 mg total) by mouth 2 (two) times daily. Take while you are taking Ibuprofen to protect your stomach 06/09/21   Richardson Dopp T, PA-C  ibuprofen (ADVIL) 200 MG tablet Take 4 tablets (800 mg total) by mouth every 8 (eight) hours. Reduce dose every 4 days by 200 mg (800 mg for 4 days, 600 mg for 4 days, 400 mg for 4 days, 200 mg for 4 days). Then stop taking. 06/09/21   Richardson Dopp T, PA-C  MILI 0.25-35 MG-MCG tablet Take 1 tablet by mouth daily. 05/25/21   [provider]   Multiple Vitamins-Minerals (WOMENS MULTI GUMMIES) CHEW Chew 2 tablets by mouth daily.    [provider]  OVER THE COUNTER MEDICATION Take 2 tablets by mouth in the morning and at bedtime. Nutriform vitamin    [provider]      Allergies    Benadryl [diphenhydramine]    Review of Systems   Review of Systems  Neurological:  Positive for headaches.    Physical Exam Updated Vital Signs BP (!) 129/100   Pulse 89   Temp 97.6 F (36.4 C) (Oral)   Resp (!) 24   Ht 5\' 8"  (1.727 m)   Wt 76.2 kg   SpO2 100%   BMI 25.54 kg/m  Physical Exam Vitals and nursing note reviewed.  Constitutional:      Comments: Appears uncomfortable.  Eyes:     Pupils: Pupils are equal, round, and reactive to light.  Cardiovascular:     Rate and Rhythm: Regular rhythm.  Skin:    General: Skin is warm.  Neurological:     Mental Status: She is alert.     Comments: Awake and appropriate but mildly slow to answer.  Moving all extremities.     ED Results / Procedures / Treatments   Labs (all labs ordered are listed, but only abnormal results are displayed) Labs Reviewed  COMPREHENSIVE METABOLIC PANEL - Abnormal; Notable for  the following components:      Result Value   Potassium 2.7 (*)    CO2 21 (*)    Glucose, Bld 128 (*)    Calcium 7.9 (*)    Alkaline Phosphatase 35 (*)    All other components within normal limits  URINALYSIS, ROUTINE W REFLEX MICROSCOPIC - Abnormal; Notable for the following components:   APPearance HAZY (*)    Specific Gravity, Urine 1.033 (*)    Glucose, UA 50 (*)    Ketones, ur 20 (*)    All other components within normal limits  I-STAT CHEM 8, ED - Abnormal; Notable for the following components:   Potassium 3.0 (*)    Glucose, Bld 135 (*)    Calcium, Ion 1.10 (*)    All other components within normal limits  POCT I-STAT 7, (LYTES, BLD GAS, ICA,H+H) - Abnormal; Notable for the following components:   pH, Arterial 7.505 (*)    pO2, Arterial 454  (*)    Acid-Base Excess 3.0 (*)    Sodium 131 (*)    Potassium 3.4 (*)    Calcium, Ion 1.08 (*)    All other components within normal limits  RESP PANEL BY RT-PCR (FLU A&B, COVID) ARPGX2  MRSA NEXT GEN BY PCR, NASAL  CBC WITH DIFFERENTIAL/PLATELET  ETHANOL  PROTIME-INR  APTT  RAPID URINE DRUG SCREEN, HOSP PERFORMED  TRIGLYCERIDES  BLOOD GAS, ARTERIAL  I-STAT BETA HCG BLOOD, ED ([facility], [facility], [facility] ONLY)    EKG EKG Interpretation  Date/Time:  Monday January 06 2022 17:54:26 EDT Ventricular Rate:  52 PR Interval:    QRS Duration: 88 QT Interval:  463 QTC Calculation: 431 R Axis:   83 Text Interpretation: Junctional rhythm Borderline repolarization abnormality since last tracing no significant change Confirmed by Daleen Bo [phone]) on 01/06/2022 7:19:56 PM  Radiology DG CHEST PORT 1 VIEW  Result Date: 01/06/2022 CLINICAL DATA:  OG tube placement EXAM: PORTABLE CHEST 1 VIEW COMPARISON:  01/06/2022 FINDINGS: Endotracheal tube is 4 cm above the carina. OG tube is in the stomach. No confluent airspace opacities. Heart is normal size. No effusions or acute bony abnormality. IMPRESSION: OG tube in the stomach. No acute cardiopulmonary disease. Electronically Signed   By: Rolm Baptise M.D.   On: 01/06/2022 23:29   DG Chest Portable 1 View  Result Date: 01/06/2022 CLINICAL DATA:  Intubation. EXAM: PORTABLE CHEST 1 VIEW COMPARISON:  Chest radiograph dated 07/26/2021. FINDINGS: Endotracheal tube with tip approximately 5 cm above the carina. The lungs are clear. There is no pleural effusion or pneumothorax. The cardiac silhouette is within normal limits. No acute osseous pathology. IMPRESSION: No acute cardiopulmonary process. Endotracheal tube with tip above the carina. Electronically Signed   By: Anner Crete M.D.   On: 01/06/2022 21:55   CT HEAD WO CONTRAST (5MM)  Addendum Date: 01/06/2022   ADDENDUM REPORT: 01/06/2022 21:42 ADDENDUM: Case discussed over the phone with Dr. Alvino Chapel at 9:43  p.m., 01/06/2022, with verbal acknowledgement of findings. Electronically Signed   By: Telford Nab M.D.   On: 01/06/2022 21:42   Result Date: 01/06/2022 CLINICAL DATA:  Subarachnoid hemorrhage follow-up. Mental status changes. EXAM: CT HEAD WITHOUT CONTRAST TECHNIQUE: Contiguous axial images were obtained from the base of the skull through the vertex without intravenous contrast. RADIATION DOSE REDUCTION: This exam was performed according to the departmental dose-optimization program which includes automated exposure control, adjustment of the mA and/or kV according to patient size and/or use of iterative reconstruction technique. COMPARISON:  Head  CT earlier today at 5:32 p.m., CTA head and neck today 5:38 p.m. FINDINGS: Brain: There is interval increased moderate subarachnoid hemorrhage in the interhemispheric fissures, suprasellar, midbrain and posterior fossa cisterns, and in the right-greater-than-left sylvian fissures. Small amount of layering blood products have also increased in the posterior horns of both ventricles and in the posterior aspect of the third ventricle extending into the sylvian aqueduct. In addition there is increased generalized cerebral edema and sulcal crowding and increased moderate ventriculomegaly involving the third and lateral ventricles. No large territorial infarct or midline shift is seen. No parenchymal bleed is evident. There does not appear to be increased cerebellar or brainstem edema at least by CT appearance. No cerebellar tonsillar herniation is seen with the left tonsil lower in position than the right but unchanged. There is increased basal cisternal crowding along with the increased cerebral edema. The fourth ventricle appears no narrower than previously Vascular: Difficult to evaluate given the presence of contrast and subarachnoid hemorrhage. CTA head demonstrated 4-5 mm aneurysms suspected of the supraclinoid left ICA and ACOM but these are not visible due to  subarachnoid hemorrhaging. Skull: No focal abnormality. Sinuses/Orbits: Clear paranasal sinuses. Unremarkable orbital contents. Other: No mastoid effusion. IMPRESSION: 1. Worsening moderate-volume subarachnoid hemorrhage as detailed above. 2. Increased cerebral edema and sulcal crowding. Increased crowding in the basal cisterns as well but no tonsillar herniation is seen. 3. Increased moderate hydrocephalus. 4. We are attempting to reach the ordering physician to relay these results. The report will be addended when I have spoken to the ordering physician. Electronically Signed: By: Telford Nab M.D. On: 01/06/2022 21:39   CT ANGIO HEAD W OR WO CONTRAST  Result Date: 01/06/2022 CLINICAL DATA:  Subarachnoid hemorrhage on same day CT head. Evaluate for aneurysm. EXAM: CT ANGIOGRAPHY HEAD TECHNIQUE: Multidetector CT imaging of the head was performed using the standard protocol during bolus administration of intravenous contrast. Multiplanar CT image reconstructions and MIPs were obtained to evaluate the vascular anatomy. RADIATION DOSE REDUCTION: This exam was performed according to the departmental dose-optimization program which includes automated exposure control, adjustment of the mA and/or kV according to patient size and/or use of iterative reconstruction technique. CONTRAST:  53mL OMNIPAQUE IOHEXOL 350 MG/ML SOLN COMPARISON:  Same day CT head. FINDINGS: CTA HEAD Anterior circulation: Bilateral intracranial ICAs, MCAs, and ACAs are patent without proximal hemodynamically significant stenosis. Irregular, lobulated 5 x 4 mm outpouching rising from the left supraclinoid ICA (see series 5, image 38). 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 Posterior circulation: Bilateral intradural vertebral arteries, basilar artery, and bilateral posterior cerebral arteries are patent without proximal hemodynamically significant stenosis. Right fetal type PCA,  anatomic variant. Venous sinuses: As permitted by contrast timing, patent. Review of the MIP images confirms the above findings. IMPRESSION: 1. Irregular, lobulated 5 x 4 mm outpouching rising from the left supraclinoid ICA, concerning for aneurysm that probably is ruptured given irregular morphology and subarachnoid hemorrhage seen on same day CT head. 2. 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 3. No emergent large vessel occlusion or proximal hemodynamically significant stenosis. Findings discussed with Dr. Alvino Chapel via telephone at 5:55 p.m. Electronically Signed   By: Margaretha Sheffield M.D.   On: 01/06/2022 18:06   CT HEAD WO CONTRAST (5MM)  Result Date: 01/06/2022 CLINICAL DATA:  Headache, sudden, severe EXAM: CT HEAD WITHOUT CONTRAST TECHNIQUE: Contiguous axial images were obtained from the base of the skull through the  vertex without intravenous contrast. RADIATION DOSE REDUCTION: This exam was performed according to the departmental dose-optimization program which includes automated exposure control, adjustment of the mA and/or kV according to patient size and/or use of iterative reconstruction technique. COMPARISON:  None Available. FINDINGS: Brain: Moderate volume of subarachnoid hemorrhage with hemorrhage along the interhemispheric falx, suprasellar cistern, sylvian fissures and basal cisterns. No evidence of acute large vascular territory infarct, mass lesion, or midline shift. Borderline ventriculomegaly. Vascular: Limited assessment for hyperdense vessel due to the subarachnoid hemorrhage. Skull: No acute fracture. Sinuses/Orbits: Clear sinuses.  No acute orbital findings. Other: No mastoid effusions. IMPRESSION: 1. Moderate volume of acute subarachnoid hemorrhage, described above. Recommend CTA to evaluate for aneurysm. 2. Borderline ventriculomegaly. Recommend continued attention on follow-up to exclude developing hydrocephalus. Critical findings discussed with Dr. Alvino Chapel  via telephone at 5:40 p.m. Electronically Signed   By: Margaretha Sheffield M.D.   On: 01/06/2022 17:43    Procedures Procedure Name: Intubation Date/Time: 01/06/2022 10:03 PM  Performed by: Davonna Belling, MDOxygen Delivery Method: Non-rebreather mask Preoxygenation: Pre-oxygenation with 100% oxygen Induction Type: Rapid sequence Ventilation: Mask ventilation without difficulty Laryngoscope Size: Glidescope and 3 Grade View: Grade I Tube type: Subglottic suction tube Tube size: 7.5 mm Number of attempts: 1 Placement Confirmation: ETT inserted through vocal cords under direct vision and Breath sounds checked- equal and bilateral Secured at: 23 cm Dental Injury: Teeth and Oropharynx as per pre-operative assessment         Medications Ordered in ED Medications  labetalol (NORMODYNE) injection 20 mg (20 mg Intravenous Given 01/06/22 1752)    And  clevidipine (CLEVIPREX) infusion 0.5 mg/mL (4 mg/hr Intravenous New Bag/Given 01/06/22 2238)   stroke: early stages of recovery book (has no administration in time range)  0.9 %  sodium chloride infusion (has no administration in time range)  acetaminophen (TYLENOL) tablet 650 mg (has no administration in time range)    Or  acetaminophen (TYLENOL) 160 MG/5ML solution 650 mg (has no administration in time range)    Or  acetaminophen (TYLENOL) suppository 650 mg (has no administration in time range)  ondansetron (ZOFRAN-ODT) disintegrating tablet 4 mg (has no administration in time range)    Or  ondansetron (ZOFRAN) injection 4 mg (has no administration in time range)  niMODipine (NIMOTOP) capsule 60 mg (has no administration in time range)    Or  niMODipine (NYMALIZE) 6 MG/ML oral solution 60 mg (has no administration in time range)  pantoprazole (PROTONIX) EC tablet 40 mg (has no administration in time range)    Or  pantoprazole sodium (PROTONIX) 40 mg/20 mL oral suspension 40 mg (has no administration in time range)  HYDROmorphone  (DILAUDID) injection 1 mg (has no administration in time range)  labetalol (NORMODYNE) injection 20 mg (has no administration in time range)    And  clevidipine (CLEVIPREX) infusion 0.5 mg/mL (has no administration in time range)  levETIRAcetam (KEPPRA) IVPB 500 mg/100 mL premix (has no administration in time range)  levETIRAcetam (KEPPRA) IVPB 1000 mg/100 mL premix (has no administration in time range)  propofol (DIPRIVAN) 1000 MG/100ML infusion (5 mcg/kg/min  76.2 kg Intravenous New Bag/Given 01/06/22 2145)  fentaNYL (SUBLIMAZE) injection 50 mcg (has no administration in time range)  fentaNYL (SUBLIMAZE) injection 50-200 mcg (100 mcg Intravenous Given 01/06/22 2328)  cefTRIAXone (ROCEPHIN) 2 g in sodium chloride 0.9 % 100 mL IVPB (has no administration in time range)  oxyCODONE-acetaminophen (PERCOCET/ROXICET) 5-325 MG per tablet 1 tablet (has no administration in time range)  docusate (COLACE)  50 MG/5ML liquid 100 mg (has no administration in time range)  fentaNYL (SUBLIMAZE) injection 50 mcg (50 mcg Intravenous Given 01/06/22 1752)  iohexol (OMNIPAQUE) 350 MG/ML injection 75 mL (75 mLs Intravenous Contrast Given 01/06/22 1738)  ondansetron (ZOFRAN) injection 4 mg (4 mg Intravenous Given 01/06/22 1759)  fentaNYL (SUBLIMAZE) injection 100 mcg (100 mcg Intravenous Given 01/06/22 1856)  levETIRAcetam (KEPPRA) IVPB 1000 mg/100 mL premix (0 mg Intravenous Stopped 01/06/22 2152)  etomidate (AMIDATE) injection 20 mg (20 mg Intravenous Given 01/06/22 2132)  rocuronium (ZEMURON) injection 100 mg (100 mg Intravenous Given 01/06/22 2132)    ED Course/ Medical Decision Making/ A&P                           Medical Decision Making Amount and/or Complexity of Data Reviewed Labs: ordered. Radiology: ordered.  Risk Prescription drug management. Decision regarding hospitalization.   Patient presented with cute onset headache.  Was worried for subarachnoid hemorrhage with the history.  Initial head CT  done and showed subarachnoid hemorrhage.  CTA added on.  Did show to likely aneurysms.  Discussed with Dr. Ronnald Ramp from neuroradiology.  I also independently interpreted the head CT.  Started on urgent blood pressure control after the initial head CT.  Also discussed with Dr. Curly Shores from neurology although this will be more of a neurosurgery intervention.  Discussed with Dr. Kathyrn Sheriff once aneurysms were found and will discuss with primary neurosurgery on-call.  Continued headache.  Still somewhat slow to answer.  CRITICAL CARE Performed by: Davonna Belling Total critical care time: 60 minutes Critical care time was exclusive of separately billable procedures and treating other patients. Critical care was necessary to treat or prevent imminent or life-threatening deterioration. Critical care was time spent personally by me on the following activities: development of treatment plan with patient and/or surrogate as well as nursing, discussions with consultants, evaluation of patient's response to treatment, examination of patient, obtaining history from patient or surrogate, ordering and performing treatments and interventions, ordering and review of laboratory studies, ordering and review of radiographic studies, pulse oximetry and re-evaluation of patient's condition.   Called back to see patient.  [facility] was here to transport.  Now decreased responsiveness again and compared to before.  Nonverbal.  Not following commands for me.  Eyes may be moving more to the left.  Still breathing spontaneously however.  Will repeat head CT and with eyes movement particular to left we will get Keppra load.  Discussed with neurosurgery again.  Will intubate and transfer to ICU for drain.  Intubated without difficulty.  Chest x-ray interpreted at the bedside and showed good positioning of the ET tube.  NG tube not been placed at this time.  Propofol ordered and will be taken emergently to ICU        Final  Clinical Impression(s) / ED Diagnoses Final diagnoses:  Subarachnoid hemorrhage due to ruptured aneurysm [facility])    Rx / DC Orders ED Discharge Orders     None         Davonna Belling, MD 01/06/22 2359

## 2022-01-06 NOTE — ED Notes (Signed)
[facility] nurse choose not to have [place] insert Nasogastric tube and temp sensing foley even though staff offered to do it.

## 2022-01-06 NOTE — Progress Notes (Signed)
Emergent EVD placement at bedside. Timeout completed @2215 . Propofol bolus 20 mcg IV given by MD prior to start @ 2220. Pt VS stable. Will await new orders.   2221, RN

## 2022-01-06 NOTE — ED Notes (Signed)
Report called to nurse over at [facility] and gave report. Calling [facility] now

## 2022-01-07 ENCOUNTER — Inpatient Hospital Stay (HOSPITAL_COMMUNITY): Payer: 59

## 2022-01-07 ENCOUNTER — Encounter (HOSPITAL_COMMUNITY)
Admission: EM | Disposition: A | Discharge status: Rehabilitation Facility | Payer: Self-pay | Source: Home / Self Care | Attending: Neurosurgery

## 2022-01-07 ENCOUNTER — Inpatient Hospital Stay (HOSPITAL_COMMUNITY): Payer: 59 | Admitting: Anesthesiology

## 2022-01-07 ENCOUNTER — Encounter (HOSPITAL_COMMUNITY): Payer: Self-pay | Admitting: Neurosurgery

## 2022-01-07 DIAGNOSIS — I608 Other nontraumatic subarachnoid hemorrhage: Secondary | ICD-10-CM

## 2022-01-07 DIAGNOSIS — I609 Nontraumatic subarachnoid hemorrhage, unspecified: Secondary | ICD-10-CM | POA: Diagnosis not present

## 2022-01-07 HISTORY — PX: IR TRANSCATH/EMBOLIZ: IMG695

## 2022-01-07 HISTORY — PX: RADIOLOGY WITH ANESTHESIA: SHX6223

## 2022-01-07 HISTORY — PX: IR ANGIOGRAM FOLLOW UP STUDY: IMG697

## 2022-01-07 HISTORY — PX: IR ANGIO INTRA EXTRACRAN SEL INTERNAL CAROTID BILAT MOD SED: IMG5363

## 2022-01-07 LAB — POCT I-STAT 7, (LYTES, BLD GAS, ICA,H+H)
Acid-base deficit: 1 mmol/L (ref 0.0–2.0)
Bicarbonate: 23 mmol/L (ref 20.0–28.0)
Calcium, Ion: 1.14 mmol/L — ABNORMAL LOW (ref 1.15–1.40)
HCT: 40 % (ref 36.0–46.0)
Hemoglobin: 13.6 g/dL (ref 12.0–15.0)
O2 Saturation: 100 %
Patient temperature: 100.6
Potassium: 3.6 mmol/L (ref 3.5–5.1)
Sodium: 133 mmol/L — ABNORMAL LOW (ref 135–145)
TCO2: 24 mmol/L (ref 22–32)
pCO2 arterial: 38.5 mmHg (ref 32–48)
pH, Arterial: 7.389 (ref 7.35–7.45)
pO2, Arterial: 175 mmHg — ABNORMAL HIGH (ref 83–108)

## 2022-01-07 LAB — GLUCOSE, CAPILLARY
Glucose-Capillary: 145 mg/dL — ABNORMAL HIGH (ref 70–99)
Glucose-Capillary: 121 mg/dL — ABNORMAL HIGH (ref 70–99)
Glucose-Capillary: 129 mg/dL — ABNORMAL HIGH (ref 70–99)

## 2022-01-07 LAB — BASIC METABOLIC PANEL
Anion gap: 16 — ABNORMAL HIGH (ref 5–15)
BUN: 8 mg/dL (ref 6–20)
CO2: 15 mmol/L — ABNORMAL LOW (ref 22–32)
Calcium: 8.5 mg/dL — ABNORMAL LOW (ref 8.9–10.3)
Chloride: 102 mmol/L (ref 98–111)
Creatinine, Ser: 0.75 mg/dL (ref 0.44–1.00)
GFR, Estimated: >60 mL/min (ref >60)
Glucose, Bld: 121 mg/dL — ABNORMAL HIGH (ref 70–99)
Potassium: 4.6 mmol/L (ref 3.5–5.1)
Sodium: 133 mmol/L — ABNORMAL LOW (ref 135–145)

## 2022-01-07 LAB — SODIUM, URINE, RANDOM: Sodium, Ur: <10 mmol/L

## 2022-01-07 LAB — MRSA NEXT GEN BY PCR, NASAL: MRSA by PCR Next Gen: NOT DETECTED

## 2022-01-07 LAB — OSMOLALITY, URINE: Osmolality, Ur: 113 mOsm/kg — ABNORMAL LOW (ref 300–900)

## 2022-01-07 LAB — TRIGLYCERIDES: Triglycerides: 120 mg/dL (ref <150)

## 2022-01-07 SURGERY — IR WITH ANESTHESIA
Anesthesia: General

## 2022-01-07 MED ORDER — PHENYLEPHRINE HCL-NACL 20-0.9 MG/250ML-% IV SOLN
INTRAVENOUS | Status: DC | PRN
  Administered 2022-01-07: 10 ug/min via INTRAVENOUS

## 2022-01-07 MED ORDER — ONDANSETRON HCL 4 MG/2ML IJ SOLN
INTRAMUSCULAR | Status: DC | PRN
  Administered 2022-01-07: 4 mg via INTRAVENOUS

## 2022-01-07 MED ORDER — THIAMINE HCL 100 MG PO TABS
100.0000 mg | ORAL_TABLET | Freq: Every day | ORAL | Status: DC
Start: 2022-01-07 — End: 2022-01-14
  Administered 2022-01-07 – 2022-01-14 (×8): 100 mg
  Filled 2022-01-07 (×8): qty 1

## 2022-01-07 MED ORDER — FENTANYL CITRATE (PF) 100 MCG/2ML IJ SOLN
INTRAMUSCULAR | Status: AC
  Filled 2022-01-07: qty 2

## 2022-01-07 MED ORDER — ADULT MULTIVITAMIN W/MINERALS CH
1.0000 | ORAL_TABLET | Freq: Every day | ORAL | Status: DC
  Administered 2022-01-08 – 2022-01-14 (×7): 1
  Filled 2022-01-07 (×7): qty 1

## 2022-01-07 MED ORDER — OSMOLITE 1.2 CAL PO LIQD
1000.0000 mL | ORAL | Status: DC
  Administered 2022-01-07: 1000 mL

## 2022-01-07 MED ORDER — VITAL HIGH PROTEIN PO LIQD: 1000.0000 mL | ORAL | Status: DC

## 2022-01-07 MED ORDER — FOLIC ACID 1 MG PO TABS
1.0000 mg | ORAL_TABLET | Freq: Every day | ORAL | Status: DC
  Administered 2022-01-07 – 2022-01-14 (×8): 1 mg
  Filled 2022-01-07 (×8): qty 1

## 2022-01-07 MED ORDER — ORAL CARE MOUTH RINSE
15.0000 mL | OROMUCOSAL | Status: DC
  Administered 2022-01-07 – 2022-01-08 (×10): 15 mL via OROMUCOSAL

## 2022-01-07 MED ORDER — ROCURONIUM BROMIDE 10 MG/ML (PF) SYRINGE
PREFILLED_SYRINGE | INTRAVENOUS | Status: DC | PRN
  Administered 2022-01-07: 50 mg via INTRAVENOUS
  Administered 2022-01-07: 40 mg via INTRAVENOUS
  Administered 2022-01-07: 100 mg via INTRAVENOUS
  Administered 2022-01-07: 50 mg via INTRAVENOUS

## 2022-01-07 MED ORDER — SODIUM CHLORIDE 0.9 % IV SOLN: INTRAVENOUS | Status: DC

## 2022-01-07 MED ORDER — IOHEXOL 300 MG/ML  SOLN
100.0000 mL | Freq: Once | INTRAMUSCULAR | Status: AC | PRN
  Administered 2022-01-07: 75 mL via INTRA_ARTERIAL

## 2022-01-07 MED ORDER — DEXAMETHASONE SODIUM PHOSPHATE 10 MG/ML IJ SOLN
INTRAMUSCULAR | Status: DC | PRN
  Administered 2022-01-07: 5 mg via INTRAVENOUS

## 2022-01-07 MED ORDER — PROSOURCE TF PO LIQD: 45.0000 mL | Freq: Two times a day (BID) | ORAL | Status: DC

## 2022-01-07 MED ORDER — ADULT MULTIVITAMIN LIQUID CH
15.0000 mL | Freq: Every day | ORAL | Status: DC
  Administered 2022-01-07: 15 mL
  Filled 2022-01-07: qty 15

## 2022-01-07 MED ORDER — LIDOCAINE HCL 1 % IJ SOLN
INTRAMUSCULAR | Status: AC
  Filled 2022-01-07: qty 20

## 2022-01-07 MED ORDER — FENTANYL CITRATE (PF) 250 MCG/5ML IJ SOLN
INTRAMUSCULAR | Status: AC
  Filled 2022-01-07: qty 5

## 2022-01-07 MED ORDER — PROSOURCE TF PO LIQD
45.0000 mL | Freq: Three times a day (TID) | ORAL | Status: DC
  Administered 2022-01-07 – 2022-01-08 (×3): 45 mL
  Filled 2022-01-07 (×3): qty 45

## 2022-01-07 MED ORDER — CHLORHEXIDINE GLUCONATE CLOTH 2 % EX PADS
6.0000 | MEDICATED_PAD | Freq: Every day | CUTANEOUS | Status: DC
  Administered 2022-01-07 – 2022-01-20 (×11): 6 via TOPICAL

## 2022-01-07 MED ORDER — CHLORHEXIDINE GLUCONATE CLOTH 2 % EX PADS: 6.0000 | MEDICATED_PAD | Freq: Every day | CUTANEOUS | Status: DC

## 2022-01-07 MED ORDER — IOHEXOL 300 MG/ML  SOLN
100.0000 mL | Freq: Once | INTRAMUSCULAR | Status: AC | PRN
Start: 2022-01-07 — End: 2022-01-07
  Administered 2022-01-07: 25 mL via INTRA_ARTERIAL

## 2022-01-07 MED ORDER — FENTANYL 2500MCG IN NS 250ML (10MCG/ML) PREMIX INFUSION
0.0000 ug/h | INTRAVENOUS | Status: DC
  Administered 2022-01-07: 25 ug/h via INTRAVENOUS
  Filled 2022-01-07: qty 250

## 2022-01-07 MED ORDER — PHENYLEPHRINE 80 MCG/ML (10ML) SYRINGE FOR IV PUSH (FOR BLOOD PRESSURE SUPPORT)
PREFILLED_SYRINGE | INTRAVENOUS | Status: DC | PRN
  Administered 2022-01-07 (×2): 40 ug via INTRAVENOUS

## 2022-01-07 MED ORDER — CHLORHEXIDINE GLUCONATE 0.12% ORAL RINSE (MEDLINE KIT)
15.0000 mL | Freq: Two times a day (BID) | OROMUCOSAL | Status: DC
  Administered 2022-01-07 – 2022-01-14 (×13): 15 mL via OROMUCOSAL
  Filled 2022-01-07: qty 15

## 2022-01-07 NOTE — Progress Notes (Signed)
Initial Nutrition Assessment  DOCUMENTATION CODES:   Not applicable  INTERVENTION:   Initiate tube feeding via OG tube: Osmolite 1.2 at 25 ml/h and increase by 10 ml every 8 hours to goal rate of 65 ml/hr (1560 ml per day) Prosource TF 45 ml TID  Provides 1992 kcal, 119 gm protein, 1265 ml free water daily  Change liquid MVI to crushable MVI with minerals  Communicated plan with RN  NUTRITION DIAGNOSIS:   Inadequate oral intake related to inability to eat as evidenced by NPO status.  GOAL:   Patient will meet greater than or equal to 90% of their needs  MONITOR:   TF tolerance  REASON FOR ASSESSMENT:   Consult Enteral/tube feeding initiation and management  ASSESSMENT:   Pt with PMH of nodular opacities in RUL and L lung, myopericarditis, previous smoker, and daily ETOH 2 glasses of wine per day admitted with acute encephalopathy due to new SAH and associated cerebral edema.   Pt discussed during ICU rounds and with RN and MD.  Pt out of room at time of visit.   Medications reviewed and include: colace, folic acid, liquid MVI, nimodipine, pantoprazole, thiamine  NS @ 100 ml/hr Cleviprex currently off Propofol @ 9 ml/hr provides 237 kcal   Labs reviewed: Na 133   EVD: 116 ml  OG tube in place; per xray in stomach   Diet Order:   Diet Order             Diet NPO time specified Except for: Sips with Meds  Diet effective now                   EDUCATION NEEDS:   Not appropriate for education at this time  Skin:  Skin Assessment: Reviewed RN Assessment  Last BM:  unknown; abd soft with active bowel sounds  Height:   Ht Readings from Last 1 Encounters:  01/06/22 5\' 8"  (1.727 m)    Weight:   Wt Readings from Last 1 Encounters:  01/06/22 76.2 kg    BMI:  Body mass index is 25.54 kg/m.  Estimated Nutritional Needs:   Kcal:  1800-2100  Protein:  95-120 grams  Fluid:  >1.9 L/day   03/08/22., RD, LDN, CNSC See [facility] for contact  information

## 2022-01-07 NOTE — Progress Notes (Signed)
[facility] Progress Note Patient Name: FEROL PICKNEY DOB: [birth year] MRN: [id number]   Date of Service  01/07/2022  HPI/Events of Note  Agitation - Propofol IV infusion near maximum dose. Patient intermittently wakes up causing a dump of CSF into EVD. Fentanyl IV pushes result in hypotension. Nursing requesting a Fentanyl IV infusion.   eICU Interventions  Plan: Fentanyl IV infusion. Titrate to RASS = 0 to -1.     Intervention Category Major Interventions: Delirium, psychosis, severe agitation - evaluation and management  Taft Worthing Eugene 01/07/2022, 10:34 PM

## 2022-01-07 NOTE — Sedation Documentation (Signed)
Right femoral puncture site assessed by this RN and Dietrich Pates, primary RN. No bleeding, bruising or hematoma noted. DP and PT pulses present. Please see flowsheets for more information.

## 2022-01-07 NOTE — Progress Notes (Signed)
Pt transported to IR on full vent support. No complications noted.

## 2022-01-07 NOTE — Anesthesia Preprocedure Evaluation (Addendum)
Anesthesia Evaluation  Patient identified by MRN, date of birth, ID band Patient unresponsive  General Assessment Comment:Intubated for airway protection d/t AMS 2/2 SAH  Reviewed: Allergy & Precautions, NPO status , Patient's Chart, lab work & pertinent test results  Airway Mallampati: Intubated       Dental  (+) Dental Advisory Given   Pulmonary  Intubated for airway protection- FiO2 35%    Pulmonary exam normal breath sounds clear to auscultation       Cardiovascular negative cardio ROS Normal cardiovascular exam Rhythm:Regular Rate:Normal     Neuro/Psych SAH  Severe sudden onset headache today.  Moderate acute SAH along the falx supresellar cister, sylvian fissures and basal cistern.  Intubated for decreased responsiveness, non verbal, for EVD placement at bedside.  negative psych ROS   GI/Hepatic negative GI ROS, Neg liver ROS,   Endo/Other  negative endocrine ROS  Renal/GU negative Renal ROS  negative genitourinary   Musculoskeletal negative musculoskeletal ROS (+)   Abdominal   Peds  Hematology negative hematology ROS (+) hct 40, plt 272   Anesthesia Other Findings   Reproductive/Obstetrics negative OB ROS                            Anesthesia Physical Anesthesia Plan  ASA: 3 and emergent  Anesthesia Plan: General   Post-op Pain Management: Tylenol PO (pre-op)*   Induction: Intravenous  PONV Risk Score and Plan: 3 and Ondansetron, Dexamethasone, Midazolam and Treatment may vary due to age or medical condition  Airway Management Planned: Oral ETT  Additional Equipment: Arterial line  Intra-op Plan:   Post-operative Plan: Post-operative intubation/ventilation  Informed Consent: I have reviewed the patients History and Physical, chart, labs and discussed the procedure including the risks, benefits and alternatives for the proposed anesthesia with the patient or  authorized representative who has indicated his/her understanding and acceptance.     Dental advisory given  Plan Discussed with: CRNA  Anesthesia Plan Comments: (Access PIV x 2)       Anesthesia Quick Evaluation

## 2022-01-07 NOTE — Progress Notes (Signed)
Pt transported to CT from 4N16 on vent and returned to pt room without incidence. Pt tol well.

## 2022-01-07 NOTE — Progress Notes (Signed)
NEUROSURGERY PROGRESS NOTE   History reviewed with Dr. Saintclair Halsted, EMR, and with patient's son and daughter at bedside.  Briefly, patient had sudden onset of headache yesterday afternoon prompting visit to the [facility] long emergency department.  Patient apparently had relatively rapid decline while in the emergency department ultimately requiring intubation for airway protection.  Repeat CT scan revealed clear evidence of progressive hydrocephalus.  Patient was emergently transferred to [facility] where a external ventricular drain was placed last night.  Of note, the patient's son reports a medical history of pericarditis approximately 8 months ago.  Otherwise, there is no known history of hypertension, diabetes, previous stroke or any known lung, liver, or kidney disease.  Patient is not on any known blood thinners or antiplatelet agents.  She quit smoking years ago.  There is no known family history of intracranial aneurysms or unexplained intracranial hemorrhage.  EXAM: Temp:  [97.6 F (36.4 C)-99.1 F (37.3 C)] 98.5 F (36.9 C) (06/13 0800) Pulse Rate:  [51-117] 63 (06/13 0900) Resp:  [5-30] 18 (06/13 0900) BP: (110-177)/(68-125) 110/68 (06/13 0900) SpO2:  [85 %-100 %] 100 % (06/13 0900) FiO2 (%):  [35 %-40 %] 35 % (06/13 0808) Weight:  [76.2 kg] 76.2 kg (06/12 1644) Intake/Output      06/12 0701 06/13 0700 06/13 0701 06/14 0700   I.V. (mL/kg) 162.1 (2.1) 20.6 (0.3)   IV Piggyback 200.6    Total Intake(mL/kg) 362.7 (4.8) 20.6 (0.3)   Urine (mL/kg/hr) 1650 400 (1.8)   Drains 116 27   Total Output 1766 427   Net -1403.3 -406.4         Per RN off propofol: Eyes open spontaneously Pupils reactive Breathing over vent Follows commands briskly all extremities  EVD in place, patent   LABS: Lab Results  Component Value Date   CREATININE 0.75 01/07/2022   BUN 8 01/07/2022   NA 133 (L) 01/07/2022   K 4.6 01/07/2022   CL 102 01/07/2022   CO2 15 (L) 01/07/2022   Lab Results   Component Value Date   WBC 9.8 01/06/2022   HGB 13.6 01/07/2022   HCT 40.0 01/07/2022   MCV 86.6 01/06/2022   PLT 272 01/06/2022    IMAGING: Multiple CT scans over the last 24 hours were personally reviewed.  Initial scan demonstrates fairly diffuse basal subarachnoid hemorrhage, no significant hydrocephalus.  Subsequent CT scan presumably after neurologic decline by my interpretation does not reveal significant worsening in volume of subarachnoid however there is notable progression of hydrocephalus with increased size of lateral ventricles and transependymal flow.  Final CT scan does demonstrate good position of the right frontal approach external ventricular drain with some decrease in ventricular size.  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  IMPRESSION: - 46 y.o. female Judy Walsh presenting as H-H 2, with decline likely related to progressive HCP. I do not feel the patient's clinical course or imaging is c/w re-hemorrhage.  Given the pattern of hemorrhage, it appears unclear which aneurysm has ruptured and I feel both will need to be treated.  PLAN: - Proceed with diagnostic angiogram, appropriate treatment (coiling v clipping) of Acom and LPcom aneurysm. - Cont to monitor in ICU - Nimotop 60mg  Q4 hrs x 21 days - Routine TCD monitoring - Vent mgmt per PCCM  I have reviewed the imaging findings at  this point with the patient's son and daughter at bedside.  We have reviewed the details of the angiogram procedure as well as possible coiling and surgical clipping.  Specifically, we did discuss risks of the procedure primarily being stroke or rehemorrhage.  Other risks including arterial dissection, groin hematoma, and contrast nephropathy as well as risk associated with  craniotomy including seizure, hematoma, and need for additional surgeries were all discussed.  Patient's son appeared to understand our discussion and provided consent on the patient's behalf to proceed as above.  All his questions today were answered.   Consuella Lose, MD [facility]

## 2022-01-07 NOTE — Progress Notes (Signed)
NAME:  Judy Walsh, MRN:  [id number], DOB:  October 12, 1975, LOS: 1 ADMISSION DATE:  01/06/2022, CONSULTATION DATE:  01/07/22 REFERRING MD:  Cram/Meyran, CHIEF COMPLAINT:  headache, n/v  History of Present Illness:  Severe sudden onset headache today.  Moderate acute SAH along the falx supresellar cister, sylvian fissures and basal cistern.   Intubated for decreased responsiveness, non verbal, for EVD placement at bedside.   CCM consulted for Vent. F/u ABG pending.   Pertinent  Medical History  Nodular opacities in RUL and L lung.  Myopericarditis hx   Meds: colchicine, ibuprofen, pepcid Significant Hospital Events: Including procedures, antibiotic start and stop dates in addition to other pertinent events   6/12 presented with significant severe headache on admission found to have subarachnoid hemorrhage with eventual development of cerebral edema requiring placement of EVD.  PCCM consulted for respiratory compromise and intubation  6/13 patient has been stable post EVD placement, tolerating ventilator  Interim History / Subjective:  No significant events postintubation  Objective   Blood pressure 112/76, pulse 69, temperature 99.1 F (37.3 C), temperature source Axillary, resp. rate 10, height 5\' 8"  (1.727 m), weight 76.2 kg, SpO2 100 %.    Vent Mode: PRVC FiO2 (%):  [35 %-40 %] 35 % Set Rate:  [18 bmp-26 bmp] 18 bmp Vt Set:  [510 mL] 510 mL PEEP:  [5 cmH20] 5 cmH20 Plateau Pressure:  [14 cmH20-17 cmH20] 14 cmH20   Intake/Output Summary (Last 24 hours) at 01/07/2022 0703 Last data filed at 01/07/2022 0600 Gross per 24 hour  Intake 349.07 ml  Output 1355 ml  Net -1005.93 ml   Filed Weights   01/06/22 1644  Weight: 76.2 kg    Examination: General: Acute ill-appearing adult female lying in bed on mechanical ventilation in no acute distress  HEENT: ETT, MM pink/moist, PERRL,  Neuro: Sedated on ventilator but when propofol lightened patient is able to follow all commands  and move all extremities CV: s1s2 regular rate and rhythm, no murmur, rubs, or gallops,  PULM: Clear to auscultation bilaterally, no increased work of breathing, tolerating ventilator GI: soft, bowel sounds active in all 4 quadrants, non-tender, non-distended Extremities: warm/dry, no edema  Skin: no rashes or lesions  Resolved Hospital Problem list     Assessment & Plan:  Subarachnoid hemorrhage, s/p EVD -Presented initially with sudden onset of severe headache associated with nausea and vomiting found to have subarachnoid hemorrhage on CT imaging.  Initially no signs of edema seen but patient decompensated overnight requiring intubation for airway protection and placement of EVD P: Management per neurology.NSGY Maintain neuro protective measures; goal for eurothermia, euglycemia, eunatermia, normoxia, and PCO2 goal of 35-40 Nutrition and bowel regiment  Seizure precautions  Continue Keppra per neurosurgery Nimotop Aspirations precautions  Prophylactic Ceftriaxone for EVD   Acute respiratory distress secondary to [facility] -Intubated for airway protection overnight P: Continue ventilator support with lung protective strategies  Wean PEEP and FiO2 for sats greater than 90%. Head of bed elevated 30 degrees. Plateau pressures less than 30 cm H20.  Follow intermittent chest x-ray and ABG.   SAT/SBT as tolerated, mentation preclude extubation  Ensure adequate pulmonary hygiene  Follow cultures  VAP bundle in place  PAD protocol  Hypokalemia P: Trend Bmet  Supplement as needed     Best Practice (right click and "Reselect all SmartList Selections" daily)   Diet/type: NPO DVT prophylaxis: SCD GI prophylaxis: PPI Lines: N/A Foley:  N/A Code Status:  full code Last date of multidisciplinary goals  of care discussion: Family updated at bedside am of 6/13  Critical care time:  CRITICAL CARE Performed by: Emilian Stawicki D. Harris  Total critical care time: 38 minutes  Critical care  time was exclusive of separately billable procedures and treating other patients.  Critical care was necessary to treat or prevent imminent or life-threatening deterioration.  Critical care was time spent personally by me on the following activities: development of treatment plan with patient and/or surrogate as well as nursing, discussions with consultants, evaluation of patient's response to treatment, examination of patient, obtaining history from patient or surrogate, ordering and performing treatments and interventions, ordering and review of laboratory studies, ordering and review of radiographic studies, pulse oximetry and re-evaluation of patient's condition.  Michille Mcelrath D. Tiburcio Pea, NP-C  Pulmonary & Critical Care Personal contact information can be found on Amion  01/07/2022, 8:02 AM

## 2022-01-07 NOTE — Progress Notes (Signed)
  Transition of Care [facility]) Screening Note   Patient Details  Name: Judy Walsh Date of Birth: 10-05-1975   Transition of Care [facility]) CM/SW Contact:    Glennon Mac, RN Phone Number: 01/07/2022, 5:15 PM    Transition of Care Department [facility]) has reviewed patient and no TOC needs have been identified at this time. We will continue to monitor patient advancement through interdisciplinary progression rounds. If new patient transition needs arise, please place a TOC consult.  Quintella Baton, RN, BSN  Trauma/Neuro ICU Case Manager [phone]

## 2022-01-07 NOTE — Progress Notes (Signed)
PT Cancellation Note  Patient Details Name: Judy Walsh MRN: [id number] DOB: [birth year]   Cancelled Treatment:    Reason Eval/Treat Not Completed: Patient at procedure or test/unavailable. Pt in IR.    Angelina Ok [facility] 01/07/2022, 10:12 AM [name] PT Acute [employer] [phone]

## 2022-01-07 NOTE — Progress Notes (Signed)
Transcranial Doppler  Date POD PCO2 HCT BP  MCA ACA PCA OPHT SIPH VERT Basilar  6/13 GC     Right  Left   28  39   *  -31   36  17   19  11   19  14    *  *   *           Right  Left                                            Right  Left                                             Right  Left                                             Right  Left                                            Right  Left                                            Right  Left                                        MCA = Middle Cerebral Artery      OPHT = Opthalmic Artery     BASILAR = Basilar Artery   ACA = Anterior Cerebral Artery     SIPH = Carotid Siphon PCA = Posterior Cerebral Artery   VERT = Verterbral Artery                   Normal MCA = 62+\-12 ACA = 50+\-12 PCA = 42+\-23   * = Unable to insonate  01/07/22 3:42 PM 01/09/22 RVT

## 2022-01-07 NOTE — Anesthesia Postprocedure Evaluation (Signed)
Anesthesia Post Note  Patient: Judy Walsh  Procedure(s) Performed: IR WITH ANESTHESIA     Patient location during evaluation: ICU Anesthesia Type: General Level of consciousness: patient remains intubated per anesthesia plan Pain management: pain level controlled Vital Signs Assessment: post-procedure vital signs reviewed and stable Respiratory status: patient remains intubated per anesthesia plan Cardiovascular status: blood pressure returned to baseline and stable Postop Assessment: no apparent nausea or vomiting Anesthetic complications: no   No notable events documented.  Last Vitals:  Vitals:   01/07/22 0900 01/07/22 1248  BP: 110/68   Pulse: 63 78  Resp: 18 17  Temp:    SpO2: 100% 100%    Last Pain:  Vitals:   01/07/22 0800  TempSrc: Axillary  PainSc:                  Lannie Fields

## 2022-01-07 NOTE — Progress Notes (Signed)
Pt transported from IR to 4N 16 on full vent support. No complications noted.

## 2022-01-07 NOTE — Anesthesia Procedure Notes (Signed)
Arterial Line Insertion Start/End6/13/2023 9:45 AM, 01/07/2022 9:50 AM Performed by: Lannie Fields, DO, anesthesiologist  Patient location: Pre-op. Preanesthetic checklist: patient identified, IV checked, site marked, risks and benefits discussed, surgical consent, monitors and equipment checked, pre-op evaluation, timeout performed and anesthesia consent Lidocaine 1% used for infiltration Left, radial was placed Catheter size: 20 G Hand hygiene performed  and maximum sterile barriers used   Attempts: 1 Procedure performed without using ultrasound guided technique. Following insertion, dressing applied. Post procedure assessment: normal and unchanged  Patient tolerated the procedure well with no immediate complications.

## 2022-01-07 NOTE — Progress Notes (Signed)
OT Cancellation Note  Patient Details Name: ROYAL BEIRNE MRN: [id number] DOB: [birth year]   Cancelled Treatment:    Reason Eval/Treat Not Completed: Patient at procedure or test/ unavailable (Pt off of the floor at IR upon arrival, OT evaluation to f/u as appropriate.)  Ulus Hazen A Xolani Degracia 01/07/2022, 10:09 AM

## 2022-01-07 NOTE — Transfer of Care (Signed)
Immediate Anesthesia Transfer of Care Note  Patient: Judy Walsh  Procedure(s) Performed: IR WITH ANESTHESIA  Patient Location: ICU  Anesthesia Type:General  Level of Consciousness: Patient remains intubated per anesthesia plan  Airway & Oxygen Therapy: Patient remains intubated per anesthesia plan and Patient placed on Ventilator (see vital sign flow sheet for setting)  Post-op Assessment: Report given to RN and Post -op Vital signs reviewed and stable  Post vital signs: Reviewed and stable  Last Vitals:  Vitals Value Taken Time  BP    Temp    Pulse 82 01/07/22 1236  Resp    SpO2 100 % 01/07/22 1236  Vitals shown include unvalidated device data.  Last Pain:  Vitals:   01/07/22 0800  TempSrc: Axillary  PainSc:          Complications: No notable events documented.

## 2022-01-07 NOTE — Sedation Documentation (Signed)
IR tech is holding pressure to right femoral puncture site. Unable to check pulses or groin site at this time.

## 2022-01-07 NOTE — Consult Note (Signed)
NAME:  Judy Walsh, MRN:  [id number], DOB:  10/21/1975, LOS: 1 ADMISSION DATE:  01/06/2022, CONSULTATION DATE:  01/07/22 REFERRING MD:  Cram/Meyran, CHIEF COMPLAINT:  headache, n/v  History of Present Illness:  Severe sudden onset headache today.  Moderate acute SAH along the falx supresellar cister, sylvian fissures and basal cistern.   Intubated for decreased responsiveness, non verbal, for EVD placement at bedside.   CCM consulted for Vent. F/u ABG pending.  Pertinent  Medical History  Nodular opacities in RUL and L lung.  Myopericarditis hx   Meds: colchicine, ibuprofen, pepcid Significant Hospital Events: Including procedures, antibiotic start and stop dates in addition to other pertinent events     Interim History / Subjective:    Objective   Blood pressure 130/88, pulse 88, temperature 98.6 F (37 C), temperature source Axillary, resp. rate 18, height 5\' 8"  (1.727 m), weight 76.2 kg, SpO2 100 %.    Vent Mode: PRVC FiO2 (%):  [40 %] 40 % Set Rate:  [24 bmp-26 bmp] 24 bmp Vt Set:  [510 mL] 510 mL PEEP:  [5 cmH20] 5 cmH20 Plateau Pressure:  [14 cmH20-17 cmH20] 14 cmH20  No intake or output data in the 24 hours ending 01/07/22 0103 Filed Weights   01/06/22 1644  Weight: 76.2 kg    Examination: General: NAD HENT: ncat intubated  Lungs: ctab  Cardiovascular: rrr  Abdomen: nt, nd, nbs Extremities: no edema Neuro: sedated  GU:   Resolved Hospital Problem list     Assessment & Plan:  Subarachnoid hemorrhage, s/p EVD Vent for AMS, EVD placement.  Cont sedation.  ABG pending.  Tolerating vent well, no probs.     CBC: Recent Labs  Lab 01/06/22 1647 01/06/22 1733 01/06/22 2338  WBC 9.8  --   --   NEUTROABS 7.5  --   --   HGB 13.7 13.6 13.9  HCT 39.3 40.0 41.0  MCV 86.6  --   --   PLT 272  --   --     Basic Metabolic Panel: Recent Labs  Lab 01/06/22 1647 01/06/22 1733 01/06/22 2338  NA 138 137 131*  K 2.7* 3.0* 3.4*  CL 107 100  --   CO2  21*  --   --   GLUCOSE 128* 135*  --   BUN 10 8  --   CREATININE 0.65 0.60  --   CALCIUM 7.9*  --   --    GFR: Estimated Creatinine Clearance: 89.6 mL/min (by C-G formula based on SCr of 0.6 mg/dL). Recent Labs  Lab 01/06/22 1647  WBC 9.8    Liver Function Tests: Recent Labs  Lab 01/06/22 1647  AST 24  ALT 15  ALKPHOS 35*  BILITOT 0.5  PROT 6.5  ALBUMIN 3.8   No results for input(s): "LIPASE", "AMYLASE" in the last 168 hours. No results for input(s): "AMMONIA" in the last 168 hours.  ABG    Component Value Date/Time   PHART 7.505 (H) 01/06/2022 2338   PCO2ART 32.3 01/06/2022 2338   PO2ART 454 (H) 01/06/2022 2338   HCO3 25.6 01/06/2022 2338   TCO2 27 01/06/2022 2338   O2SAT 100 01/06/2022 2338     Coagulation Profile: Recent Labs  Lab 01/06/22 1738  INR 1.1    Cardiac Enzymes: No results for input(s): "CKTOTAL", "CKMB", "CKMBINDEX", "TROPONINI" in the last 168 hours.  HbA1C: Hgb A1c MFr Bld  Date/Time Value Ref Range Status  06/08/2021 04:45 AM 4.8 4.8 - 5.6 % Final  Comment:    (NOTE)         Prediabetes: 5.7 - 6.4         Diabetes: >6.4         Glycemic control for adults with diabetes: <7.0     CBG: No results for input(s): "GLUCAP" in the last 168 hours.  Review of Systems:   Unablet o assess   Past Medical History:  She,  has a past medical history of Chest x-ray abnormality (06/09/2021) and Myopericarditis (06/08/2021).   Surgical History:  History reviewed. No pertinent surgical history.   Social History:   reports that she has never smoked. She has never used smokeless tobacco. She reports that she does not drink alcohol and does not use drugs.   Family History:  Her family history includes CVA in her father.   Allergies Allergies  Allergen Reactions   Benadryl [Diphenhydramine] Hives     Home Medications  Prior to Admission medications   Medication Sig Start Date End Date Taking? Authorizing Provider  colchicine 0.6 MG  tablet Take 1 tablet (0.6 mg total) by mouth 2 (two) times daily. 06/09/21 09/07/21  Tereso Newcomer T, PA-C  COLLAGEN PO Take 1 capsule by mouth daily.    [provider]  famotidine (PEPCID) 20 MG tablet Take 1 tablet (20 mg total) by mouth 2 (two) times daily. Take while you are taking Ibuprofen to protect your stomach 06/09/21   Tereso Newcomer T, PA-C  ibuprofen (ADVIL) 200 MG tablet Take 4 tablets (800 mg total) by mouth every 8 (eight) hours. Reduce dose every 4 days by 200 mg (800 mg for 4 days, 600 mg for 4 days, 400 mg for 4 days, 200 mg for 4 days). Then stop taking. 06/09/21   Tereso Newcomer T, PA-C  MILI 0.25-35 MG-MCG tablet Take 1 tablet by mouth daily. 05/25/21   [provider]  Multiple Vitamins-Minerals (WOMENS MULTI GUMMIES) CHEW Chew 2 tablets by mouth daily.    [provider]  OVER THE COUNTER MEDICATION Take 2 tablets by mouth in the morning and at bedtime. Nutriform vitamin    [provider]     Critical care time: 

## 2022-01-07 NOTE — Brief Op Note (Signed)
  NEUROSURGERY BRIEF OPERATIVE  NOTE   PREOP DX: Subarachnoid Hemorrhage  POSTOP DX: Same  PROCEDURE: Diagnostic cerebral angiogram, Coil embolization of Anterior communicating artery aneurysm and left posterior communicating artery aneurysm.  SURGEON: Dr. Consuella Lose, MD  ANESTHESIA: GETA  EBL: Minimal  SPECIMENS: None  COMPLICATIONS: None  CONDITION: Stable to ICU  FINDINGS (Full report in [facility]): 1. Successful coil embolization of both anterior communicating and left posterior communicating artery aneurysms.   Consuella Lose, MD [facility]

## 2022-01-07 NOTE — Progress Notes (Signed)
Tested urine specific gravity and it resulted as 1.0133.  Beryl Meager, RN

## 2022-01-08 ENCOUNTER — Encounter (HOSPITAL_COMMUNITY): Payer: Self-pay

## 2022-01-08 ENCOUNTER — Inpatient Hospital Stay (HOSPITAL_COMMUNITY): Payer: 59

## 2022-01-08 DIAGNOSIS — J9601 Acute respiratory failure with hypoxia: Secondary | ICD-10-CM

## 2022-01-08 DIAGNOSIS — I608 Other nontraumatic subarachnoid hemorrhage: Secondary | ICD-10-CM | POA: Diagnosis not present

## 2022-01-08 DIAGNOSIS — Z9911 Dependence on respirator [ventilator] status: Secondary | ICD-10-CM | POA: Diagnosis not present

## 2022-01-08 DIAGNOSIS — I609 Nontraumatic subarachnoid hemorrhage, unspecified: Secondary | ICD-10-CM | POA: Diagnosis not present

## 2022-01-08 HISTORY — PX: IR NEURO EACH ADD'L AFTER BASIC UNI RIGHT (MS): IMG5374

## 2022-01-08 HISTORY — PX: IR NEURO EACH ADD'L AFTER BASIC UNI LEFT (MS): IMG5373

## 2022-01-08 HISTORY — PX: IR ANGIO VERTEBRAL SEL VERTEBRAL UNI R MOD SED: IMG5368

## 2022-01-08 LAB — GLUCOSE, CAPILLARY
Glucose-Capillary: 130 mg/dL — ABNORMAL HIGH (ref 70–99)
Glucose-Capillary: 94 mg/dL (ref 70–99)
Glucose-Capillary: 97 mg/dL (ref 70–99)
Glucose-Capillary: 110 mg/dL — ABNORMAL HIGH (ref 70–99)
Glucose-Capillary: 120 mg/dL — ABNORMAL HIGH (ref 70–99)

## 2022-01-08 LAB — BASIC METABOLIC PANEL
Anion gap: 7 (ref 5–15)
BUN: 6 mg/dL (ref 6–20)
CO2: 20 mmol/L — ABNORMAL LOW (ref 22–32)
Calcium: 8.2 mg/dL — ABNORMAL LOW (ref 8.9–10.3)
Chloride: 114 mmol/L — ABNORMAL HIGH (ref 98–111)
Creatinine, Ser: 0.69 mg/dL (ref 0.44–1.00)
GFR, Estimated: >60 mL/min (ref >60)
Glucose, Bld: 128 mg/dL — ABNORMAL HIGH (ref 70–99)
Potassium: 3.3 mmol/L — ABNORMAL LOW (ref 3.5–5.1)
Sodium: 141 mmol/L (ref 135–145)

## 2022-01-08 LAB — MAGNESIUM: Magnesium: 1.9 mg/dL (ref 1.7–2.4)

## 2022-01-08 LAB — PHOSPHORUS: Phosphorus: 2.9 mg/dL (ref 2.5–4.6)

## 2022-01-08 MED ORDER — MAGNESIUM SULFATE 2 GM/50ML IV SOLN
2.0000 g | Freq: Once | INTRAVENOUS | Status: AC
  Administered 2022-01-08: 2 g via INTRAVENOUS
  Filled 2022-01-08: qty 50

## 2022-01-08 MED ORDER — ORAL CARE MOUTH RINSE
15.0000 mL | OROMUCOSAL | Status: DC
  Administered 2022-01-08 – 2022-01-14 (×23): 15 mL via OROMUCOSAL

## 2022-01-08 MED ORDER — OLANZAPINE 10 MG IM SOLR
10.0000 mg | Freq: Once | INTRAMUSCULAR | Status: DC | PRN
  Filled 2022-01-08: qty 10

## 2022-01-08 MED ORDER — POTASSIUM CHLORIDE 20 MEQ PO PACK
20.0000 meq | PACK | ORAL | Status: AC
  Administered 2022-01-08 (×2): 20 meq
  Filled 2022-01-08 (×2): qty 1

## 2022-01-08 MED ORDER — FENTANYL CITRATE PF 50 MCG/ML IJ SOSY
25.0000 ug | PREFILLED_SYRINGE | INTRAMUSCULAR | Status: DC | PRN
  Administered 2022-01-09 – 2022-01-12 (×18): 25 ug via INTRAVENOUS
  Filled 2022-01-08 (×18): qty 1

## 2022-01-08 MED ORDER — DEXMEDETOMIDINE HCL IN NACL 400 MCG/100ML IV SOLN
0.0000 ug/kg/h | INTRAVENOUS | Status: AC
  Administered 2022-01-08: 0.4 ug/kg/h via INTRAVENOUS
  Administered 2022-01-09: 0.2 ug/kg/h via INTRAVENOUS
  Filled 2022-01-08 (×2): qty 100

## 2022-01-08 MED ORDER — FENTANYL CITRATE PF 50 MCG/ML IJ SOSY
50.0000 ug | PREFILLED_SYRINGE | INTRAMUSCULAR | Status: DC | PRN
  Administered 2022-01-08 (×3): 50 ug via INTRAVENOUS
  Filled 2022-01-08 (×4): qty 1

## 2022-01-08 MED ORDER — OSMOLITE 1.2 CAL PO LIQD
1000.0000 mL | ORAL | Status: DC
  Administered 2022-01-08 – 2022-01-14 (×5): 1000 mL

## 2022-01-08 MED ORDER — POTASSIUM CHLORIDE 10 MEQ/100ML IV SOLN
10.0000 meq | INTRAVENOUS | Status: AC
  Administered 2022-01-08 (×4): 10 meq via INTRAVENOUS
  Filled 2022-01-08 (×4): qty 100

## 2022-01-08 MED ORDER — PROSOURCE TF PO LIQD
45.0000 mL | Freq: Three times a day (TID) | ORAL | Status: DC
Start: 2022-01-08 — End: 2022-01-15
  Administered 2022-01-08 – 2022-01-14 (×19): 45 mL
  Filled 2022-01-08 (×18): qty 45

## 2022-01-08 NOTE — Procedures (Signed)
Extubation Procedure Note  Patient Details:   Name: Judy Walsh DOB: 11-29-1975 MRN: [id number]   Airway Documentation:    Vent end date: 01/08/22 Vent end time: 1018   Evaluation  O2 sats: stable throughout Complications: No apparent complications Patient did tolerate procedure well. Bilateral Breath Sounds: Clear   Pt.extubated to 4L [place] per MD order. Pt. Positive cuff leak prior to extubation. Pt. Able to voice name, no stridor noted.  Guss Bunde 01/08/2022, 10:19 AM

## 2022-01-08 NOTE — Progress Notes (Signed)
NAME:  Judy Walsh, MRN:  [id number], DOB:  1975-08-18, LOS: 2 ADMISSION DATE:  01/06/2022, CONSULTATION DATE:  01/07/22 REFERRING MD:  Cram/Meyran, CHIEF COMPLAINT:  headache, n/v  History of Present Illness:  Severe sudden onset headache today.  Moderate acute SAH along the falx supresellar cister, sylvian fissures and basal cistern.   Intubated for decreased responsiveness, non verbal, for EVD placement at bedside.   CCM consulted for Vent. F/u ABG pending.   Pertinent  Medical History  Nodular opacities in RUL and L lung.  Myopericarditis hx   Meds: colchicine, ibuprofen, pepcid Significant Hospital Events: Including procedures, antibiotic start and stop dates in addition to other pertinent events   6/12 presented with significant severe headache on admission found to have subarachnoid hemorrhage with eventual development of cerebral edema requiring placement of EVD.  PCCM consulted for respiratory compromise and intubation  6/13 patient has been stable post EVD placement, tolerating ventilator. Underwent coiling of aneurysm   6/14 Currently tolerating vent went  Interim History / Subjective:  Alert and interactive on vent, following commands   Objective   Blood pressure (!) 122/96, pulse 65, temperature 98.3 F (36.8 C), temperature source Axillary, resp. rate (!) 6, height 5\' 8"  (1.727 m), weight 74.3 kg, SpO2 100 %.    Vent Mode: PRVC FiO2 (%):  [35 %] 35 % Set Rate:  [18 bmp] 18 bmp Vt Set:  [510 mL] 510 mL PEEP:  [5 cmH20] 5 cmH20 Plateau Pressure:  [13 cmH20-15 cmH20] 13 cmH20   Intake/Output Summary (Last 24 hours) at 01/08/2022 0837 Last data filed at 01/08/2022 0800 Gross per 24 hour  Intake 3959.25 ml  Output 3598 ml  Net 361.25 ml    Filed Weights   01/06/22 1644 01/08/22 0630  Weight: 76.2 kg 74.3 kg    Examination: General: Acute ill appearing adult female on mechanical ventilation, in NAD HEENT: [place]/AT, MM pink/moist, PERRL,  Neuro: Interactive  on vent, able to follow commands  CV: s1s2 regular rate and rhythm, no murmur, rubs, or gallops,  PULM:  Clear to ascultation, no increased work of breathing, no added breath sounds  GI: soft, bowel sounds active in all 4 quadrants, non-tender, non-distended, tolerating TF Extremities: warm/dry, no edema  Skin: no rashes or lesions  Resolved Hospital Problem list     Assessment & Plan:  Subarachnoid hemorrhage, s/p EVD -Presented initially with sudden onset of severe headache associated with nausea and vomiting found to have subarachnoid hemorrhage on CT imaging.  Initially no signs of edema seen but patient decompensated overnight requiring intubation for airway protection and placement of EVD P: Management per neurology/NSGY Maintain neuro protective measures; goal for eurothermia, euglycemia, eunatermia, normoxia, and PCO2 goal of 35-40 Nutrition and bowel regiment  Seizure precautions  Aspirations precautions  Continue Keppra and Nimotop  Remains on prophylactic Ceftriaxone for EVD   Acute respiratory distress secondary to [facility] -Intubated for airway protection overnight P: Tolerating SBT with hopes for extubation later this morning   Continue ventilator support with lung protective strategies  Wean PEEP and FiO2 for sats greater than 90%. Head of bed elevated 30 degrees. Plateau pressures less than 30 cm H20.  Follow intermittent chest x-ray and ABG.   SAT/SBT as tolerated, mentation preclude extubation  Ensure adequate pulmonary hygiene  Follow cultures  VAP bundle in place  PAD protocol  Hypokalemia P: Trend Bmet  Supplement as needed   Best Practice (right click and "Reselect all SmartList Selections" daily)   Diet/type: NPO  DVT prophylaxis: SCD GI prophylaxis: PPI Lines: N/A Foley:  N/A Code Status:  full code Last date of multidisciplinary goals of care discussion: Family updated at bedside am of 6/13  Critical care time:  CRITICAL CARE Performed by:  Hicks Feick D. Harris  Total critical care time: 38 minutes  Critical care time was exclusive of separately billable procedures and treating other patients.  Critical care was necessary to treat or prevent imminent or life-threatening deterioration.  Critical care was time spent personally by me on the following activities: development of treatment plan with patient and/or surrogate as well as nursing, discussions with consultants, evaluation of patient's response to treatment, examination of patient, obtaining history from patient or surrogate, ordering and performing treatments and interventions, ordering and review of laboratory studies, ordering and review of radiographic studies, pulse oximetry and re-evaluation of patient's condition.  Trejan Buda D. Tiburcio Pea, NP-C [facility] Personal contact information can be found on Amion  01/08/2022, 8:37 AM

## 2022-01-08 NOTE — Procedures (Signed)
Cortrak  Tube Type:  Cortrak - 43 inches Tube Location:  Left nare Initial Placement:  Stomach Secured by: Bridle Technique Used to Measure Tube Placement:  Marking at nare/corner of mouth Cortrak Secured At:  72 cm  Cortrak Tube Team Note:  Consult received to place a Cortrak feeding tube.   X-ray is required, abdominal x-ray has been ordered by the Cortrak team. Please confirm tube placement before using the Cortrak tube.   If the tube becomes dislodged please keep the tube and contact the Cortrak team at [email or web address] ([email or web address]) for replacement.  If after hours and replacement cannot be delayed, place a NG tube and confirm placement with an abdominal x-ray.    Nikiya Starn MS, RD, LDN Please refer to AMION for RD and/or RD on-call/weekend/after hours pager   

## 2022-01-08 NOTE — Progress Notes (Signed)
Inpatient Rehab Admissions Coordinator:   Per PT recommendations pt was screened for CIR by Estill Dooms, PT, DPT.  Pt does meet criteria for CIR based on functional decline and medical necessity.  She still has an EVD.  I will continue to follow her progress peripherally over the next few days to see if she remains a potential candidate when EVD is closer to discontinuation.    Estill Dooms, PT, DPT Admissions Coordinator [phone] 01/08/22  4:01 PM

## 2022-01-08 NOTE — Progress Notes (Signed)
Transcranial Doppler  Date POD PCO2 HCT BP  MCA ACA PCA OPHT SIPH VERT Basilar  6/13 GC     Right  Left   28  39   *  -31   36  17   19  11   19  14    *  *   *      6/14 RH     Right  Left   82  73   *  -28   40  21   23  21    54  53   -44  -58   -71           Right  Left                                             Right  Left                                             Right  Left                                            Right  Left                                            Right  Left                                        MCA = Middle Cerebral Artery      OPHT = Opthalmic Artery     BASILAR = Basilar Artery   ACA = Anterior Cerebral Artery     SIPH = Carotid Siphon PCA = Posterior Cerebral Artery   VERT = Verterbral Artery                   Normal MCA = 62+\-12 ACA = 50+\-12 PCA = 42+\-23    * = Unable to insonate  RT [place] = 2.93 , LT [place] = 1.87  Darlin Coco, RDMS, RVT

## 2022-01-08 NOTE — Progress Notes (Signed)
SLP Cancellation Note  Patient Details Name: NESIAH JUMP MRN: [id number] DOB: Sep 07, 1975   Cancelled treatment:       Reason Eval/Treat Not Completed: Fatigue/lethargy limiting ability to participate (Pt's case discussed with RN and referring MD. Dr. Chestine Spore advised that swallow eval and SLE were to be ordered and not MBS unless indicated. Pt's RN reported that the pt is too lethargic to participate in the evaluations. SLP orders modified based on these discussions.)  Ngoc Detjen I. Vear Clock, MS, CCC-SLP Acute Rehabilitation Services Office number [phone] Pager [phone]  [name] 01/08/2022, 1:27 PM

## 2022-01-08 NOTE — Evaluation (Signed)
Physical Therapy Evaluation Patient Details Name: Judy Walsh MRN: [id number] DOB: February 28, 1976 Today's Date: 01/08/2022  History of Present Illness  Judy Walsh is a 46 yo female who presented 6/12 with severe headache with nausea and vomiting, found to have subarachnoid hemorrhage with eventual development of cerebral edema requiring emergent placement of EVD. 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 PMHx: myopericarditisJulie Walsh is a 46 yo female who presented 6/12 with severe headache with nausea and vomiting, found to have subarachnoid hemorrhage with eventual development of cerebral edema requiring emergent placement of EVD. 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 PMHx: myopericarditis.    Clinical Impression  Pt in bed upon arrival of PT, agreeable to evaluation at this time despite lethargy after extubation this afternoon. Prior to admission the pt was completely independent with all mobility, living at home with her 30 yo daughter, and working two jobs. The pt now presents with limitations in functional mobility, strength, balance, and activity tolerance due to above dx, and will continue to benefit from skilled PT to address these deficits. The pt required maxA of 2 to complete transfers due to lethargy, but once situated sitting EOB, was able to achieve static sitting with moments of minG. The pt was able to follow commands with all 4 extremities briskly, but demos slight deficits in strength in RLE at time of eval (could be limited by lethargy, but pt demos 4+/5 to MMT on LLE). After ~5 min pt reports nausea and was returned to supine for pt comfort. Will continue to benefit from skilled PT to progress functional independence and strength for OOB transfers and to initiate gait training. Pt and family hopeful for eventual return  home where she has great family support, will likely need acute inpatient rehab once medically stable to d/c.         Recommendations for follow up therapy are one component of a multi-disciplinary discharge planning process, led by the attending physician.  Recommendations may be updated based on patient status, additional functional criteria and insurance authorization.  Follow Up Recommendations Acute inpatient rehab (3hours/day)    Assistance Recommended at Discharge Frequent or constant Supervision/Assistance  Patient can return home with the following  Two people to help with walking and/or transfers;A lot of help with bathing/dressing/bathroom;Assistance with cooking/housework;Assistance with feeding;Direct supervision/assist for financial management;Direct supervision/assist for medications management;Assist for transportation;Help with stairs or ramp for entrance    Equipment Recommendations  (defer to post acute)  Recommendations for Other Services  Rehab consult    Functional Status Assessment Patient has had a recent decline in their functional status and demonstrates the ability to make significant improvements in function in a reasonable and predictable amount of time.     Precautions / Restrictions Precautions Precautions: Fall Precaution Comments: EVD, A-line, cortrak Restrictions Weight Bearing Restrictions: No      Mobility  Bed Mobility Overal bed mobility: Needs Assistance Bed Mobility: Supine to Sit, Sit to Supine     Supine to sit: Max assist, +2 for physical assistance, HOB elevated Sit to supine: Max assist, +2 for physical assistance, HOB elevated   General bed mobility comments: pt witih little initiation, maxA to return to supine due to onset of nausea and vomiting    Transfers                   General transfer comment: deferred due to nausea,  vomiting, and level of arousal     Modified Rankin (Stroke Patients Only) Modified Rankin  (Stroke Patients Only) Pre-Morbid Rankin Score: No symptoms Modified Rankin: Severe disability     Balance Overall balance assessment: Needs assistance Sitting-balance support: Bilateral upper extremity supported, Feet supported Sitting balance-Leahy Scale: Poor Sitting balance - Comments: at times can be minG, mostly minA to modA at times to maintain static sitting.pt with eyes closed, BP elevated to 140s/110s.                                     Pertinent Vitals/Pain Pain Assessment Pain Assessment: Faces Faces Pain Scale: Hurts a little bit Pain Location: pt denies pain, grimacing at some points, medicated for headache just prior to session Pain Descriptors / Indicators: Discomfort, Grimacing, Headache Pain Intervention(s): Premedicated before session, Monitored during session, Limited activity within patient's tolerance    Home Living Family/patient expects to be discharged to:: Private residence Living Arrangements: Children Available Help at Discharge: Available 24 hours/day Type of Home: House Home Access: Stairs to enter Entrance Stairs-Rails: None Entrance Stairs-Number of Steps: 2 Alternate Level Stairs-Number of Steps: flight Home Layout: Two level;Able to live on main level with bedroom/bathroom Home Equipment: None      Prior Function Prior Level of Function : Independent/Modified Independent;Driving;Working/employed             Mobility Comments: independent with all mobility ADLs Comments: works 2 jobs Administrator, sports and Research officer, political party)     Higher education careers adviser   Dominant Hand: Right    Extremity/Trunk Assessment   Upper Extremity Assessment Upper Extremity Assessment: Defer to OT evaluation    Lower Extremity Assessment Lower Extremity Assessment: LLE deficits/detail;RLE deficits/detail RLE Deficits / Details: grossly 3/5 to MMT, unable to fully discern if due to cognition but pt was able to complete MMT with 4+/5 to MMT with LLE RLE Sensation:  decreased light touch LLE Deficits / Details: grossly 4+/5 to MMT    Cervical / Trunk Assessment Cervical / Trunk Assessment: Normal  Communication   Communication: Other (comment) (recently extubated, soft voice, limited assessment due to lethargy)  Cognition Arousal/Alertness: Lethargic Behavior During Therapy: Flat affect Overall Cognitive Status: Difficult to assess                                 General Comments: difficult to fully assess due to lethargy and limited vocalizations, best with yes/no at this time. following simple commands briskly        General Comments General comments (skin integrity, edema, etc.): BP elevated to 140s/110s with onset of nausea and vomiting in sitting    Exercises     Assessment/Plan    PT Assessment Patient needs continued PT services  PT Problem List Decreased strength;Decreased range of motion;Decreased activity tolerance;Decreased balance;Decreased mobility;Impaired sensation       PT Treatment Interventions DME instruction;Gait training;Stair training;Functional mobility training;Therapeutic activities;Therapeutic exercise;Balance training;Patient/family education    PT Goals (Current goals can be found in the Care Plan section)  Acute Rehab PT Goals Patient Stated Goal: return home PT Goal Formulation: With patient Time For Goal Achievement: 01/22/22 Potential to Achieve Goals: Good    Frequency Min 4X/week     Co-evaluation PT/OT/SLP Co-Evaluation/Treatment: Yes Reason for Co-Treatment: Complexity of the patient's impairments (multi-system involvement);Necessary to address cognition/behavior during functional activity;For patient/therapist safety;To address functional/ADL transfers PT goals  addressed during session: Mobility/safety with mobility;Balance;Strengthening/ROM         AM-PAC PT "6 Clicks" Mobility  Outcome Measure Help needed turning from your back to your side while in a flat bed without using  bedrails?: Total Help needed moving from lying on your back to sitting on the side of a flat bed without using bedrails?: Total Help needed moving to and from a bed to a chair (including a wheelchair)?: Total Help needed standing up from a chair using your arms (e.g., wheelchair or bedside chair)?: Total Help needed to walk in hospital room?: Total Help needed climbing 3-5 steps with a railing? : Total 6 Click Score: 6    End of Session   Activity Tolerance: Patient limited by lethargy;Other (comment) (nausea) Patient left: in bed;with call bell/phone within reach;with bed alarm set;with nursing/sitter in room;with family/visitor present;with restraints reapplied Nurse Communication: Mobility status PT Visit Diagnosis: Other abnormalities of gait and mobility (R26.89);Muscle weakness (generalized) (M62.81);Hemiplegia and hemiparesis Hemiplegia - Right/Left: Right Hemiplegia - dominant/non-dominant: Dominant Hemiplegia - caused by: Nontraumatic SAH    Time: 7711-6579 PT Time Calculation (min) (ACUTE ONLY): 27 min   Charges:   PT Evaluation $PT Eval High Complexity: 1 High          Vickki Muff, PT, DPT   Acute Rehabilitation Department  Ronnie Derby 01/08/2022, 3:37 PM

## 2022-01-08 NOTE — Progress Notes (Signed)
PT Cancellation Note  Patient Details Name: Judy Walsh MRN: [id number] DOB: 12/23/1975   Cancelled Treatment:    Reason Eval/Treat Not Completed: Other (comment) Per RN, pt awaiting extubation this morning, will plan to follow up and evaluate as appropriate.   Vickki Muff, PT, DPT   Acute Rehabilitation Department   Ronnie Derby 01/08/2022, 8:36 AM

## 2022-01-08 NOTE — Progress Notes (Signed)
[facility] ADULT ICU REPLACEMENT PROTOCOL   The patient does apply for the [facility] Adult ICU Electrolyte Replacment Protocol based on the criteria listed below:   1.Exclusion criteria: TCTS patients, ECMO patients, and Dialysis patients 2. Is GFR >/= 30 ml/min? Yes.    Patient's GFR today is >60 3. Is SCr </= 2? Yes.   Patient's SCr is 0.69 mg/dL 4. Did SCr increase >/= 0.5 in 24 hours? No 5.Pt's weight >40kg  Yes.   6. Abnormal electrolyte(s): K+ 3.3, Mag 1.9  7. Electrolytes replaced per protocol 8.  Call MD STAT for K+ </= 2.5, Phos </= 1, or Mag </= 1 Physician:  Thresa Ross 01/08/2022 5:52 AM

## 2022-01-08 NOTE — Progress Notes (Signed)
  NEUROSURGERY PROGRESS NOTE   Pt seen and examined. No issues overnight.   EXAM: Temp:  [98.3 F (36.8 C)-99.1 F (37.3 C)] 98.3 F (36.8 C) (06/14 0800) Pulse Rate:  [60-91] 70 (06/14 0900) Resp:  [0-23] 14 (06/14 0900) BP: (100-141)/(59-103) 136/86 (06/14 0900) SpO2:  [99 %-100 %] 100 % (06/14 0900) Arterial Line BP: (107-159)/(55-93) 150/85 (06/14 0900) FiO2 (%):  [35 %] 35 % (06/14 0848) Weight:  [74.3 kg] 74.3 kg (06/14 0630) Intake/Output      06/13 0701 06/14 0700 06/14 0701 06/15 0700   I.V. (mL/kg) 2409.2 (32.4) 16.9 (0.2)   NG/GT 1014 50.3   IV Piggyback 368.4 111.8   Total Intake(mL/kg) 3791.7 (51) 179.1 (2.4)   Urine (mL/kg/hr) 3296 (1.8)    Drains 271 46   Blood 20    Total Output 3587 46   Net +204.7 +133.1         Awake, alert Nods appropriately to questions Breathing spontaneously on CPAP CN grossly intact Follows commands briskly x4 Right groin soft  LABS: Lab Results  Component Value Date   CREATININE 0.69 01/08/2022   BUN 6 01/08/2022   NA 141 01/08/2022   K 3.3 (L) 01/08/2022   CL 114 (H) 01/08/2022   CO2 20 (L) 01/08/2022   Lab Results  Component Value Date   WBC 9.8 01/06/2022   HGB 13.6 01/07/2022   HCT 40.0 01/07/2022   MCV 86.6 01/06/2022   PLT 272 01/06/2022     IMPRESSION: - 46 y.o. female SAH d# 2 s/p coil embo Acom/Left Pcom aneurysms. Appears neurologically intact  PLAN: - Extubate today per PCCM - Cont Nimotop - TCD today  Plan d/w family at bedside. All questions answered.   Lisbeth Renshaw, MD [facility]

## 2022-01-08 NOTE — Progress Notes (Signed)
eLink Physician-Brief Progress Note Patient Name: Judy Walsh DOB: 02-26-1976 MRN: [id number]   Date of Service  01/08/2022  HPI/Events of Note  Patient with agitated delirium s/p subarachnoid hemorrhage.  eICU Interventions  Trial of Zyprexa 10 mg IM x 1, if this fails to calm patient down Precedex gtt will be started (0.0 - 0.4 mcg) to provide light sedation, anxiolysis and analgesia. Restraints orders entered.        Thomasene Lot Janitza Revuelta 01/08/2022, 11:17 PM

## 2022-01-08 NOTE — Progress Notes (Signed)
OT Cancellation Note  Patient Details Name: Judy Walsh MRN: [id number] DOB: Sep 24, 1975   Cancelled Treatment:    Reason Eval/Treat Not Completed: Other (comment) (Per discussion with RN, pt with plans to potentially extubate this am. OT evaluation to f/u post extubation.)  Shayra Anton A Antrell Tipler 01/08/2022, 8:24 AM

## 2022-01-08 NOTE — Evaluation (Addendum)
Occupational Therapy Evaluation Patient Details Name: Judy Walsh MRN: [id number] DOB: 12-19-1975 Today's Date: 01/08/2022   History of Present Illness Judy Walsh is a 46 yo female who presented 6/12 with severe headache with nausea and vomiting, found to have subarachnoid hemorrhage with eventual development of cerebral edema requiring emergent placement of EVD. 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 PMHx: myopericarditisJulie Walsh is a 46 yo female who presented 6/12 with severe headache with nausea and vomiting, found to have subarachnoid hemorrhage with eventual development of cerebral edema requiring emergent placement of EVD. 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 PMHx: myopericarditis   Clinical Impression   EVD clamped by RN prior to the session. Judy Walsh was evaluated s/p the above admission list, she is indep at baseline including working 2 jobs and driving. She lives in a 2 level home with her daughter who can assist as needed at d/c. Upon evaluation, pt was initially lethargic but agreeable to the session. She required max +2 for bed mobility, and min G- mod A for sitting balance. Pt maintained eyes closed for the majority of the session but followed most simple 1 step commands, moving BUEs equally. She currently requires total A for LB ADLs and up to max A for UB ADLs. She will benefit from OT acutely. Recommend d/c to AIR for maximal functional recovery towards her baseline.      Recommendations for follow up therapy are one component of a multi-disciplinary discharge planning process, led by the attending physician.  Recommendations may be updated based on patient status, additional functional criteria and insurance authorization.   Follow Up Recommendations  Acute inpatient rehab (3hours/day)    Assistance  Recommended at Discharge Frequent or constant Supervision/Assistance  Patient can return home with the following A lot of help with walking and/or transfers;A lot of help with bathing/dressing/bathroom;Assistance with cooking/housework;Direct supervision/assist for medications management;Assist for transportation;Help with stairs or ramp for entrance    Functional Status Assessment  Patient has had a recent decline in their functional status and demonstrates the ability to make significant improvements in function in a reasonable and predictable amount of time.  Equipment Recommendations  Other (comment) (pending pt progression)    Recommendations for Other Services Rehab consult     Precautions / Restrictions Precautions Precautions: Fall Precaution Comments: EVD, A-line, cortrak Restrictions Weight Bearing Restrictions: No      Mobility Bed Mobility               General bed mobility comments: pt with little initiation, maxA to return to supine due to onset of nausea and vomiting    Transfers                          Balance                                           ADL either performed or assessed with clinical judgement   ADL Overall ADL's : Needs assistance/impaired Eating/Feeding: NPO   Grooming: Maximal assistance;Sitting   Upper Body Bathing: Maximal assistance;Sitting   Lower Body Bathing: Total assistance;Bed level   Upper Body Dressing : Maximal assistance;Sitting   Lower Body Dressing: Total assistance;Bed level   Toilet Transfer: Total assistance   Toileting- Clothing Manipulation  and Hygiene: Total assistance       Functional mobility during ADLs: Maximal assistance;+2 for physical assistance;+2 for safety/equipment General ADL Comments: pt following most simple commands to assist in participation of bed mobility and ADLs. eyes close the majority of the session and lethargic which limited evaluation. overall pt  moves BUEs equally, needs further assessment     Vision   Vision Assessment?: Vision impaired- to be further tested in functional context Additional Comments: eyes closed >90% of the session, vision needs further assessment once pt is more alert     Perception     Praxis      Pertinent Vitals/Pain       Hand Dominance Right   Extremity/Trunk Assessment Upper Extremity Assessment Upper Extremity Assessment: Defer to OT evaluation   Lower Extremity Assessment Lower Extremity Assessment: LLE deficits/detail;RLE deficits/detail RLE Deficits / Details: grossly 3/5 to MMT, unable to fully discern if due to cognition but pt was able to complete MMT with 4+/5 to MMT with LLE RLE Sensation: decreased light touch LLE Deficits / Details: grossly 4+/5 to MMT   Cervical / Trunk Assessment Cervical / Trunk Assessment: Normal   Communication Communication Communication: Other (comment) (recently extubated, soft voice, limited assessment due to lethargy)   Cognition                                             General Comments  BP elevated to 140s/110s with onset of nausea and vomiting in sitting    Exercises     Shoulder Instructions      Home Living Family/patient expects to be discharged to:: Private residence Living Arrangements: Children Available Help at Discharge: Available 24 hours/day Type of Home: House Home Access: Stairs to enter [employer] of Steps: 2 Entrance Stairs-Rails: None Home Layout: Two level;Able to live on main level with bedroom/bathroom Alternate Level Stairs-Number of Steps: flight   Bathroom Shower/Tub: Producer, television/film/video: Standard     Home Equipment: None          Prior Functioning/Environment Prior Level of Function : Independent/Modified Independent;Driving;Working/employed             Mobility Comments: independent with all mobility ADLs Comments: works 2 jobs Administrator, sports and Licensed conveyancer)        OT Problem List: Decreased strength;Decreased range of motion;Decreased activity tolerance;Impaired balance (sitting and/or standing);Decreased safety awareness;Pain      OT Treatment/Interventions: Self-care/ADL training;Therapeutic exercise;DME and/or AE instruction;Therapeutic activities;Patient/family education;Balance training    OT Goals(Current goals can be found in the care plan section) Acute Rehab OT Goals Patient Stated Goal: unable to state OT Goal Formulation: With patient/family Time For Goal Achievement: 01/22/22 Potential to Achieve Goals: Good ADL Goals Pt Will Perform Grooming: with set-up;sitting Pt Will Perform Upper Body Dressing: with set-up;sitting Pt Will Perform Lower Body Dressing: with min assist;sit to/from stand Pt Will Transfer to Toilet: with min assist;ambulating Additional ADL Goal #1: Pt will complete bed mobility with supervision A as a precursor to ADLs  OT Frequency: Min 2X/week    Co-evaluation PT/OT/SLP Co-Evaluation/Treatment: Yes Reason for Co-Treatment: Complexity of the patient's impairments (multi-system involvement);Necessary to address cognition/behavior during functional activity;For patient/therapist safety;To address functional/ADL transfers PT goals addressed during session: Mobility/safety with mobility;Balance;Strengthening/ROM OT goals addressed during session: ADL's and self-care      AM-PAC OT "6 Clicks" Daily Activity  Outcome Measure Help from another person eating meals?: Total Help from another person taking care of personal grooming?: A Lot Help from another person toileting, which includes using toliet, bedpan, or urinal?: Total Help from another person bathing (including washing, rinsing, drying)?: A Lot Help from another person to put on and taking off regular upper body clothing?: A Lot Help from another person to put on and taking off regular lower body clothing?: Total 6 Click Score: 9   End of  Session Nurse Communication: Mobility status;Other (comment) (N&V)  Activity Tolerance: Patient tolerated treatment well Patient left: in bed;with call bell/phone within reach;with family/visitor present;with nursing/sitter in room  OT Visit Diagnosis: Unsteadiness on feet (R26.81);Other abnormalities of gait and mobility (R26.89);Muscle weakness (generalized) (M62.81);Pain                Time: 1355-1427 OT Time Calculation (min): 32 min Charges:  OT General Charges $OT Visit: 1 Visit OT Evaluation $OT Eval Moderate Complexity: 1 Mod   Mannie Wineland A Taren Dymek 01/08/2022, 4:16 PM

## 2022-01-09 DIAGNOSIS — R410 Disorientation, unspecified: Secondary | ICD-10-CM

## 2022-01-09 DIAGNOSIS — G9340 Encephalopathy, unspecified: Secondary | ICD-10-CM

## 2022-01-09 DIAGNOSIS — I671 Cerebral aneurysm, nonruptured: Secondary | ICD-10-CM

## 2022-01-09 DIAGNOSIS — I609 Nontraumatic subarachnoid hemorrhage, unspecified: Secondary | ICD-10-CM | POA: Diagnosis not present

## 2022-01-09 LAB — URINALYSIS, ROUTINE W REFLEX MICROSCOPIC
Bilirubin Urine: NEGATIVE
Glucose, UA: NEGATIVE mg/dL
Hgb urine dipstick: NEGATIVE
Ketones, ur: NEGATIVE mg/dL
Leukocytes,Ua: NEGATIVE
Nitrite: NEGATIVE
Protein, ur: NEGATIVE mg/dL
Specific Gravity, Urine: 1.032 — ABNORMAL HIGH (ref 1.005–1.030)
pH: 5 (ref 5.0–8.0)

## 2022-01-09 LAB — BASIC METABOLIC PANEL
Anion gap: 10 (ref 5–15)
BUN: 7 mg/dL (ref 6–20)
CO2: 21 mmol/L — ABNORMAL LOW (ref 22–32)
Calcium: 8.4 mg/dL — ABNORMAL LOW (ref 8.9–10.3)
Chloride: 107 mmol/L (ref 98–111)
Creatinine, Ser: 0.53 mg/dL (ref 0.44–1.00)
GFR, Estimated: >60 mL/min (ref >60)
Glucose, Bld: 119 mg/dL — ABNORMAL HIGH (ref 70–99)
Potassium: 3.6 mmol/L (ref 3.5–5.1)
Sodium: 138 mmol/L (ref 135–145)

## 2022-01-09 LAB — GLUCOSE, CAPILLARY
Glucose-Capillary: 110 mg/dL — ABNORMAL HIGH (ref 70–99)
Glucose-Capillary: 110 mg/dL — ABNORMAL HIGH (ref 70–99)
Glucose-Capillary: 122 mg/dL — ABNORMAL HIGH (ref 70–99)
Glucose-Capillary: 128 mg/dL — ABNORMAL HIGH (ref 70–99)
Glucose-Capillary: 155 mg/dL — ABNORMAL HIGH (ref 70–99)
Glucose-Capillary: 173 mg/dL — ABNORMAL HIGH (ref 70–99)
Glucose-Capillary: 176 mg/dL — ABNORMAL HIGH (ref 70–99)

## 2022-01-09 LAB — CBC
HCT: 35.1 % — ABNORMAL LOW (ref 36.0–46.0)
Hemoglobin: 11.5 g/dL — ABNORMAL LOW (ref 12.0–15.0)
MCH: 29.6 pg (ref 26.0–34.0)
MCHC: 32.8 g/dL (ref 30.0–36.0)
MCV: 90.2 fL (ref 80.0–100.0)
Platelets: 218 10*3/uL (ref 150–400)
RBC: 3.89 MIL/uL (ref 3.87–5.11)
RDW: 12.1 % (ref 11.5–15.5)
WBC: 6.3 10*3/uL (ref 4.0–10.5)
nRBC: 0 % (ref 0.0–0.2)

## 2022-01-09 LAB — PROCALCITONIN: Procalcitonin: <0.1 ng/mL

## 2022-01-09 LAB — MAGNESIUM: Magnesium: 1.9 mg/dL (ref 1.7–2.4)

## 2022-01-09 LAB — PHOSPHORUS: Phosphorus: 3.3 mg/dL (ref 2.5–4.6)

## 2022-01-09 MED ORDER — QUETIAPINE FUMARATE 25 MG PO TABS
25.0000 mg | ORAL_TABLET | Freq: Two times a day (BID) | ORAL | Status: DC
Start: 2022-01-09 — End: 2022-01-12
  Administered 2022-01-09 – 2022-01-11 (×6): 25 mg
  Filled 2022-01-09 (×6): qty 1

## 2022-01-09 MED ORDER — POTASSIUM CHLORIDE 20 MEQ PO PACK
40.0000 meq | PACK | Freq: Once | ORAL | Status: AC
  Administered 2022-01-09: 40 meq
  Filled 2022-01-09: qty 2

## 2022-01-09 MED ORDER — MAGNESIUM SULFATE 2 GM/50ML IV SOLN
2.0000 g | Freq: Once | INTRAVENOUS | Status: AC
  Administered 2022-01-09: 2 g via INTRAVENOUS
  Filled 2022-01-09: qty 50

## 2022-01-09 MED ORDER — BETHANECHOL CHLORIDE 10 MG PO TABS
10.0000 mg | ORAL_TABLET | Freq: Three times a day (TID) | ORAL | Status: AC
  Administered 2022-01-09 – 2022-01-12 (×9): 10 mg
  Filled 2022-01-09 (×10): qty 1

## 2022-01-09 MED ORDER — HYDRALAZINE HCL 20 MG/ML IJ SOLN
5.0000 mg | INTRAMUSCULAR | Status: AC | PRN
  Filled 2022-01-09 (×2): qty 1

## 2022-01-09 MED ORDER — LEVETIRACETAM 100 MG/ML PO SOLN
500.0000 mg | Freq: Two times a day (BID) | ORAL | Status: DC
  Administered 2022-01-09 – 2022-01-14 (×11): 500 mg
  Filled 2022-01-09 (×11): qty 5

## 2022-01-09 NOTE — Progress Notes (Signed)
Nutrition Follow-up  DOCUMENTATION CODES:   Not applicable  INTERVENTION:   Tube feeding via cortrak tube: Osmolite 1.2 @ 65 ml/hr (1560 ml per day) Prosource TF 45 ml TID  Provides 1992 kcal, 119 gm protein, 1265 ml free water daily  MVI with minerals  NUTRITION DIAGNOSIS:   Inadequate oral intake related to inability to eat as evidenced by NPO status. Ongoing.   GOAL:   Patient will meet greater than or equal to 90% of their needs Met with TF at goal.   MONITOR:   TF tolerance  REASON FOR ASSESSMENT:   Consult Enteral/tube feeding initiation and management  ASSESSMENT:   Pt with PMH of nodular opacities in RUL and L lung, myopericarditis, previous smoker, and daily ETOH 2 glasses of wine per day admitted with acute encephalopathy due to new SAH and associated cerebral edema.   Pt discussed during ICU rounds and with RN and MD. SLP recommends NPO except ice chips when alert.   6/12 EVD placement 6/13 s/p coiling of aneurysm  6/14 s/p extubation; cortrak placement tip in distal stomach   Medications reviewed and include: colace, folic acid, MVI with minerals, nimodipine, pantoprazole, thiamine  Precedex  Labs reviewed   EVD: 350 ml    Diet Order:   Diet Order             Diet NPO time specified Except for: Sips with Meds  Diet effective now                   EDUCATION NEEDS:   Not appropriate for education at this time  Skin:  Skin Assessment: Reviewed RN Assessment  Last BM:  unknown; abd soft with active bowel sounds  Height:   Ht Readings from Last 1 Encounters:  01/06/22 '5\' 8"'  (1.727 m)    Weight:   Wt Readings from Last 1 Encounters:  01/08/22 74.3 kg    BMI:  Body mass index is 24.91 kg/m.  Estimated Nutritional Needs:   Kcal:  1800-2100  Protein:  95-120 grams  Fluid:  >1.9 L/day   Lockie Pares., RD, LDN, CNSC See [facility] for contact information

## 2022-01-09 NOTE — Progress Notes (Signed)
Dr. Conchita Paris raised the IVC to 39mm Mercury.

## 2022-01-09 NOTE — Progress Notes (Addendum)
Physical Therapy Treatment Patient Details Name: Judy Walsh MRN: [id number] DOB: 08-12-1975 Today's Date: 01/09/2022   History of Present Illness Judy Walsh is a 46 yo female who presented 6/12 with severe headache with nausea and vomiting, found to have subarachnoid hemorrhage with eventual development of cerebral edema requiring emergent placement of EVD. 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 PMHx: myopericarditis..    PT Comments    The pt presents with continued lethargy, but followed all simple commands briskly at this time and is agreeable to session. The pt was able to follow cues to complete gross motor tasks such as transitioning to sitting EOB, scoot forwards at EOB, and stand but required cues and physical initiation from the therapist to begin movements. The pt was able to tolerate sitting EOB ~5 min and stand for ~30 seconds prior to returning to sitting EOB. VSS with all mobility. Will continue to benefit from skilled PT to progress strength, power, and activity tolerance. Continue to recommend acute inpatient rehab at d/c to facilitate return to full independence.     Recommendations for follow up therapy are one component of a multi-disciplinary discharge planning process, led by the attending physician.  Recommendations may be updated based on patient status, additional functional criteria and insurance authorization.  Follow Up Recommendations  Acute inpatient rehab (3hours/day)     Assistance Recommended at Discharge Frequent or constant Supervision/Assistance  Patient can return home with the following Two people to help with walking and/or transfers;A lot of help with bathing/dressing/bathroom;Assistance with cooking/housework;Assistance with feeding;Direct supervision/assist for financial management;Direct supervision/assist for medications management;Assist for transportation;Help  with stairs or ramp for entrance   Equipment Recommendations   (defer to post acute)    Recommendations for Other Services Rehab consult     Precautions / Restrictions Precautions Precautions: Fall Precaution Comments: EVD, cortrak Restrictions Weight Bearing Restrictions: No     Mobility  Bed Mobility Overal bed mobility: Needs Assistance Bed Mobility: Supine to Sit, Sit to Supine     Supine to sit: Mod assist, +2 for physical assistance, HOB elevated Sit to supine: Min guard, HOB elevated   General bed mobility comments: pt able to initiate with LE to move towards EOB, modA to initiate with trunk and UE    Transfers Overall transfer level: Needs assistance Equipment used: 2 person hand held assist Transfers: Sit to/from Stand Sit to Stand: Min assist, +2 physical assistance           General transfer comment: minA to rise, up to modA at times to steady, pt maintaining bilateral knees in slight flexion, unable to progress to stepping    Ambulation/Gait               General Gait Details: pt unable to take steps at this time    Modified Rankin (Stroke Patients Only) Modified Rankin (Stroke Patients Only) Pre-Morbid Rankin Score: No symptoms Modified Rankin: Severe disability     Balance Overall balance assessment: Needs assistance Sitting-balance support: Bilateral upper extremity supported, Feet supported Sitting balance-Leahy Scale: Poor Sitting balance - Comments: mostly minG to maintain static sitting, does use BUE support   Standing balance support: Bilateral upper extremity supported, During functional activity Standing balance-Leahy Scale: Poor Standing balance comment: dependent on BUE support and up to modA to steady  Cognition Arousal/Alertness: Lethargic Behavior During Therapy: Flat affect Overall Cognitive Status: Difficult to assess                                 General  Comments: difficult to fully assess due to lethargy and limited vocalizations, best with yes/no at this time. following simple commands briskly        Exercises      General Comments General comments (skin integrity, edema, etc.): VSS on RA with changes in position      Pertinent Vitals/Pain Pain Assessment Pain Assessment: No/denies pain Pain Location: pt denies pain, medicated for headache prior to session Pain Descriptors / Indicators: Headache Pain Intervention(s): Monitored during session, Premedicated before session     PT Goals (current goals can now be found in the care plan section) Acute Rehab PT Goals Patient Stated Goal: return home PT Goal Formulation: With patient Time For Goal Achievement: 01/22/22 Potential to Achieve Goals: Good Progress towards PT goals: Progressing toward goals    Frequency    Min 4X/week      PT Plan Current plan remains appropriate       AM-PAC PT "6 Clicks" Mobility   Outcome Measure  Help needed turning from your back to your side while in a flat bed without using bedrails?: A Lot Help needed moving from lying on your back to sitting on the side of a flat bed without using bedrails?: A Lot Help needed moving to and from a bed to a chair (including a wheelchair)?: Total Help needed standing up from a chair using your arms (e.g., wheelchair or bedside chair)?: Total Help needed to walk in hospital room?: Total Help needed climbing 3-5 steps with a railing? : Total 6 Click Score: 8    End of Session   Activity Tolerance: Patient limited by lethargy Patient left: in bed;with call bell/phone within reach;with bed alarm set;with nursing/sitter in room;with family/visitor present;with restraints reapplied Nurse Communication: Mobility status PT Visit Diagnosis: Other abnormalities of gait and mobility (R26.89);Muscle weakness (generalized) (M62.81);Hemiplegia and hemiparesis Hemiplegia - Right/Left: Right Hemiplegia -  dominant/non-dominant: Dominant Hemiplegia - caused by: Nontraumatic SAH     Time: 1761-6073 PT Time Calculation (min) (ACUTE ONLY): 24 min  Charges:  $Therapeutic Exercise: 8-22 mins $Therapeutic Activity: 8-22 mins                     Vickki Muff, PT, DPT   Acute Rehabilitation Department   Ronnie Derby 01/09/2022, 12:49 PM

## 2022-01-09 NOTE — Evaluation (Signed)
Speech Language Pathology Evaluation Patient Details Name: Judy Walsh MRN: [id number] DOB: 1975-12-07 Today's Date: 01/09/2022 Time: 0900-0909 SLP Time Calculation (min) (ACUTE ONLY): 9 min  Problem List:  Patient Active Problem List   Diagnosis Date Noted   SAH (subarachnoid hemorrhage) (HCC) 01/06/2022   Orthostatic hypotension 06/09/2021   Menorrhagia 06/09/2021   Chest x-ray abnormality 06/09/2021   Myopericarditis 06/08/2021   Past Medical History:  Past Medical History:  Diagnosis Date   Chest x-ray abnormality 06/09/2021   CXR 11/22: Vague nodular opacities in the mid left lung and right upper lobe which may be due to overlapping structures. Comparison with older imaging studies or follow-up is recommended to exclude pulmonary nodule.    Myopericarditis 06/08/2021   admx 11/22 >> hsTrop mildly elevated w/o trend; BNP and CRP high - trending down at DC // Echocardiogram 11/22: no effusion, EF 55-60, no RWMA, mild LVH >> NSAIDs/Colchicine   Past Surgical History:  Past Surgical History:  Procedure Laterality Date   IR ANGIO INTRA EXTRACRAN SEL INTERNAL CAROTID BILAT MOD SED  01/07/2022   IR ANGIO VERTEBRAL SEL VERTEBRAL UNI R MOD SED  01/08/2022   IR ANGIOGRAM FOLLOW UP STUDY  01/07/2022   IR ANGIOGRAM FOLLOW UP STUDY  01/07/2022   IR ANGIOGRAM FOLLOW UP STUDY  01/07/2022   IR ANGIOGRAM FOLLOW UP STUDY  01/07/2022   IR NEURO EACH ADD'L AFTER BASIC UNI LEFT (MS)  01/08/2022   IR NEURO EACH ADD'L AFTER BASIC UNI RIGHT (MS)  01/08/2022   IR TRANSCATH/EMBOLIZ  01/07/2022   RADIOLOGY WITH ANESTHESIA N/A 01/07/2022   Procedure: IR WITH ANESTHESIA;  Surgeon: Lisbeth Renshaw, MD;  Location: [facility] OR;  Service: Radiology;  Laterality: N/A;   HPI:  Pt is a 46 year old female who presented to the ED with sudden onset of severe headache as well as nausea and vomiting.  CT head 6/12: Moderate volume of acute subarachnoid hemorrhage along the falx supresellar cister, sylvian fissures and  basal cistern. Repeat CT head 6/12 revealed  worsening moderate-volume subarachnoid hemorrhage with increased cerebral edema and sulcal crowding. 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 Decreased responsiveness noted in the ED and pt intubated. ETT 6/12-6/14.   Assessment / Plan / Recommendation Clinical Impression  Pt participated in a limited speech-language-cognition evaluation with her son present. The evaluation was limited due to pt's lethargy and pt's verbal request for the evaluation to be terminated prematurely. Pt and her son denied the pt having any deficits in speech, language, or cognition at baseline. Pt reported that she has a bachelor's degree and was employed as a Veterinary surgeon prior to admission. Motor speech and language skills appeared functional, for the limited verbal communication noted. [facility] Mental Status Examination was initiated, but not completed per pt's request. She achieved an adjusted score of 1/14. Formal and informal assessment revealed difficulty in the areas of awareness, temporal orientation, attention, memory, and problem solving. Skilled SLP services are clinically indicated at this time for diagnostic treatment and cognitive-linguistic intervention.    SLP Assessment  SLP Recommendation/Assessment: Patient needs continued Speech Lanaguage Pathology Services SLP Visit Diagnosis: Cognitive communication deficit (R41.841)    Recommendations for follow up therapy are one component of a multi-disciplinary discharge planning process, led by the attending physician.  Recommendations may be updated based on patient status, additional functional criteria and insurance authorization.    Follow Up Recommendations  Acute inpatient rehab (  3hours/day)    Assistance Recommended at Discharge  Frequent or constant Supervision/Assistance  Functional Status  Assessment Patient has had a recent decline in their functional status and demonstrates the ability to make significant improvements in function in a reasonable and predictable amount of time.  Frequency and Duration min 2x/week  2 weeks      SLP Evaluation Cognition  Overall Cognitive Status: Impaired/Different from baseline Arousal/Alertness: Lethargic Orientation Level: Oriented to person;Disoriented to time Year:  (pt denied knowledge) Month:  (pt denied knowledge) Day of Week: Incorrect Attention: Focused;Sustained Focused Attention: Impaired Focused Attention Impairment: Verbal complex Sustained Attention: Impaired Sustained Attention Impairment: Verbal basic Memory: Impaired Memory Impairment:  (Immediate: 5/5; delayed: 0/5; with cues: 0/5) Awareness: Impaired Awareness Impairment: Intellectual impairment Problem Solving: Impaired Problem Solving Impairment: Verbal basic       Comprehension  Auditory Comprehension Yes/No Questions: Within Functional Limits Commands: Within Functional Limits (1-step 6/6) Conversation: Simple    Expression Verbal Expression Initiation: No impairment Level of Generative/Spontaneous Verbalization: Conversation Repetition: No impairment   Oral / Motor  Oral Motor/Sensory Function Overall Oral Motor/Sensory Function: Within functional limits Motor Speech Overall Motor Speech: Appears within functional limits for tasks assessed Respiration: Within functional limits Phonation: Low vocal intensity Resonance: Within functional limits Articulation: Within functional limitis Intelligibility: Intelligible           Annslee Tercero I. Vear Clock, MS, CCC-SLP Acute Rehabilitation Services Office number [phone] Pager [phone]  [name] 01/09/2022, 9:36 AM

## 2022-01-09 NOTE — Progress Notes (Addendum)
NAME:  Judy Walsh, MRN:  [id number], DOB:  08-20-1975, LOS: 3 ADMISSION DATE:  01/06/2022, CONSULTATION DATE:  01/07/22 REFERRING MD:  Cram/Meyran, CHIEF COMPLAINT:  headache, n/v  History of Present Illness:  Severe sudden onset headache 6/12. CT head with moderate acute SAH along the falx supresellar cister, sylvian fissures and basal cistern. Neurosurger consulted. EVD placed at bedside. Throughout ED stay with decreased responsiveness requiring intubation. PCCM consulted   Pertinent  Medical History  Nodular opacities in RUL and L lung.  Myopericarditis hx  Meds: colchicine, ibuprofen, pepcid  Significant Hospital Events: Including procedures, antibiotic start and stop dates in addition to other pertinent events   6/12 presented with significant severe headache on admission found to have subarachnoid hemorrhage with eventual development of cerebral edema requiring placement of EVD.  PCCM consulted for respiratory compromise and intubation  6/13 patient has been stable post EVD placement, tolerating ventilator. Underwent coiling of aneurysm   6/14 Currently tolerating vent went  Interim History / Subjective:  Overnight with severe agitation requiring 4 point restraints and Precedex gtt.   Objective   Blood pressure 121/81, pulse (!) 58, temperature 100.1 F (37.8 C), temperature source Axillary, resp. rate (!) 21, height 5\' 8"  (1.727 m), weight 74.3 kg, SpO2 99 %.    Vent Mode: PSV;CPAP FiO2 (%):  [35 %] 35 % PEEP:  [5 cmH20] 5 cmH20 Pressure Support:  [5 cmH20] 5 cmH20   Intake/Output Summary (Last 24 hours) at 01/09/2022 0735 Last data filed at 01/09/2022 0700 Gross per 24 hour  Intake 1922.14 ml  Output 2000 ml  Net -77.86 ml   Filed Weights   01/06/22 1644 01/08/22 0630  Weight: 76.2 kg 74.3 kg    Examination: General: Adult female, lying in bed, no distress noted  HEENT: EVD in place > set to 10, small bore NG in pace  Neuro: Opens eyes with physical  stimulation, pupils 3 mm bilaterally and reactive, follows commands, 4/5 strength throughout  CV: s1s2 regular rate and rhythm, no murmur, rubs, or gallops,  PULM:  Clear to ascultation, no accessory muscle use GI: soft, active bowel sounds  Extremities: warm/dry, no edema  Skin: no rashes or lesions  Resolved Hospital Problem list     Assessment & Plan:   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  Initially no signs of edema seen but patient decompensated requiring intubation for airway protection and placement of EVD P: Management per neurology/NSGY Daily TCDs  Maintain neuro protective measures; goal for eurothermia, euglycemia, eunatermia, normoxia, and PCO2 goal of 35-40 Nutrition and bowel regiment  Seizure precautions  Aspirations precautions  Continue Keppra and Nimotop  Remains on prophylactic Ceftriaxone for EVD   Agitation  Plan Titrate Precedex for RASS goal 0/-1 Add low dose Seroquel > will obtain EKG in AM   Acute respiratory distress secondary to [facility] -Intubated for airway protection overnight -Extubated 6/14 P: Titrate Supplemental oxygen for saturation goal >92 (currently on room air)  Head of bed elevated 30 degrees. Ensure adequate pulmonary hygiene   Febrile State, WBC 6.3, likely secondary to [facility]  P: Send Blood Cultures  Send U/A  Trend WBC and Fever Curve   Hypokalemia P: Trend Bmet  Supplement as needed   Best Practice (right click and "Reselect all SmartList Selections" daily)   Diet/type: tubefeeds DVT prophylaxis: SCD  GI prophylaxis: PPI Code Status:  full code Last date of multidisciplinary goals of care discussion: Family updated at bedside am of 6/13  Critical care time:  CRITICAL CARE Performed by: Tobey Grim  Total critical care  time: 32 minutes  Critical care time was exclusive of separately billable procedures and treating other patients.  Critical care was necessary to treat or prevent imminent or life-threatening deterioration.  Critical care was time spent personally by me on the following activities: development of treatment plan with patient and/or surrogate as well as nursing, discussions with consultants, evaluation of patient's response to treatment, examination of patient, obtaining history from patient or surrogate, ordering and performing treatments and interventions, ordering and review of laboratory studies, ordering and review of radiographic studies, pulse oximetry and re-evaluation of patient's condition.  Jovita Kussmaul, AGACNP-BC [facility]   PCCM Pgr: [phone]

## 2022-01-09 NOTE — Progress Notes (Signed)
eLink Physician-Brief Progress Note Patient Name: Judy Walsh DOB: Sep 03, 1975 MRN: [id number]   Date of Service  01/09/2022  HPI/Events of Note  BP 142-154/ 88-106. HR 71.  eICU Interventions  PRN IV Hydralazine ordered for BP that exceeds Neurosurgery parameters.        Thomasene Lot Jacquees Gongora 01/09/2022, 12:31 AM

## 2022-01-09 NOTE — Progress Notes (Signed)
[facility] ADULT ICU REPLACEMENT PROTOCOL   The patient does apply for the [facility] Adult ICU Electrolyte Replacment Protocol based on the criteria listed below:   1.Exclusion criteria: TCTS patients, ECMO patients, and Dialysis patients 2. Is GFR >/= 30 ml/min? Yes.    Patient's GFR today is >60 3. Is SCr </= 2? Yes.   Patient's SCr is 0.53 mg/dL 4. Did SCr increase >/= 0.5 in 24 hours? No. 5.Pt's weight >40kg  Yes.   6. Abnormal electrolyte(s): K+ 3.6, mag 1.9  7. Electrolytes replaced per protocol 8.  Call MD STAT for K+ </= 2.5, Phos </= 1, or Mag </= 1 Physician:  n/a  Melvern Banker 01/09/2022 4:27 AM

## 2022-01-09 NOTE — Evaluation (Signed)
Clinical/Bedside Swallow Evaluation Patient Details  Name: Judy Walsh MRN: [id number] Date of Birth: 10/09/1975  Today's Date: 01/09/2022 Time: SLP Start Time (ACUTE ONLY): 0849 SLP Stop Time (ACUTE ONLY): 0900 SLP Time Calculation (min) (ACUTE ONLY): 11 min  Past Medical History:  Past Medical History:  Diagnosis Date   Chest x-ray abnormality 06/09/2021   CXR 11/22: Vague nodular opacities in the mid left lung and right upper lobe which may be due to overlapping structures. Comparison with older imaging studies or follow-up is recommended to exclude pulmonary nodule.    Myopericarditis 06/08/2021   admx 11/22 >> hsTrop mildly elevated w/o trend; BNP and CRP high - trending down at DC // Echocardiogram 11/22: no effusion, EF 55-60, no RWMA, mild LVH >> NSAIDs/Colchicine   Past Surgical History:  Past Surgical History:  Procedure Laterality Date   IR ANGIO INTRA EXTRACRAN SEL INTERNAL CAROTID BILAT MOD SED  01/07/2022   IR ANGIO VERTEBRAL SEL VERTEBRAL UNI R MOD SED  01/08/2022   IR ANGIOGRAM FOLLOW UP STUDY  01/07/2022   IR ANGIOGRAM FOLLOW UP STUDY  01/07/2022   IR ANGIOGRAM FOLLOW UP STUDY  01/07/2022   IR ANGIOGRAM FOLLOW UP STUDY  01/07/2022   IR NEURO EACH ADD'L AFTER BASIC UNI LEFT (MS)  01/08/2022   IR NEURO EACH ADD'L AFTER BASIC UNI RIGHT (MS)  01/08/2022   IR TRANSCATH/EMBOLIZ  01/07/2022   RADIOLOGY WITH ANESTHESIA N/A 01/07/2022   Procedure: IR WITH ANESTHESIA;  Surgeon: Lisbeth Renshaw, MD;  Location: [facility] OR;  Service: Radiology;  Laterality: N/A;   HPI:  Pt is a 46 year old female who presented to the ED with sudden onset of severe headache as well as nausea and vomiting.  CT head 6/12: Moderate volume of acute subarachnoid hemorrhage along the falx supresellar cister, sylvian fissures and basal cistern. Repeat CT head 6/12 revealed  worsening moderate-volume subarachnoid hemorrhage with increased cerebral edema and sulcal crowding. 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 Decreased responsiveness noted in the ED and pt intubated. ETT 6/12-6/14.    Assessment / Plan / Recommendation  Clinical Impression  Pt was seen for bedside swallow evaluation with her son present who denied the pt having a history of dysphagia. Oral mechanism exam was limited due to pt's lethargy; however, oral motor strength and ROM appeared grossly [facility] and she presented with adequate, natural dentition. Vocal intensity was reduced and pt's son described the pt's voice as "sort of raspy". Pt demonstrated delayed coughing with consecutive swallows of thin liquids via straw, but otherwise tolerated solids and liquids without symptoms of oropharyngeal dysphagia. If adequately alert, pt may have ice chips and individual sips of water after oral care. However, diet initiation will be deferred until alertness improves. SLP Visit Diagnosis: Dysphagia, unspecified (R13.10)    Aspiration Risk  Mild aspiration risk    Diet Recommendation NPO (pt may have ice chips and individual sips of water after oral care)   Liquid Administration via: Cup;Straw Medication Administration: Via alternative means Supervision: Patient able to self feed Compensations: Slow rate;Small sips/bites Postural Changes: Seated upright at 90 degrees    Other  Recommendations Oral Care Recommendations: Oral care QID;Oral care prior to ice chip/H20    Recommendations for follow up therapy are one component of a multi-disciplinary discharge planning process, led by the attending physician.  Recommendations may be updated based on patient status, additional functional criteria and insurance authorization.  Follow up Recommendations Acute inpatient rehab (3hours/day)      Assistance Recommended at Discharge Frequent or constant Supervision/Assistance  Functional Status Assessment Patient has had a recent decline in  their functional status and demonstrates the ability to make significant improvements in function in a reasonable and predictable amount of time.  Frequency and Duration min 2x/week  2 weeks       Prognosis Prognosis for Safe Diet Advancement: Good Barriers to Reach Goals: Cognitive deficits      Swallow Study   General Date of Onset: 01/08/22 HPI: Pt is a 46 year old female who presented to the ED with sudden onset of severe headache as well as nausea and vomiting.  CT head 6/12: Moderate volume of acute subarachnoid hemorrhage along the falx supresellar cister, sylvian fissures and basal cistern. Repeat CT head 6/12 revealed  worsening moderate-volume subarachnoid hemorrhage with increased cerebral edema and sulcal crowding. 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 Decreased responsiveness noted in the ED and pt intubated. ETT 6/12-6/14. Type of Study: Bedside Swallow Evaluation Previous Swallow Assessment: none Diet Prior to this Study: NPO;NG Tube Temperature Spikes Noted: No Respiratory Status: Room air History of Recent Intubation: Yes Length of Intubations (days): 2 days Date extubated: 01/08/22 Behavior/Cognition: Lethargic/Drowsy;Cooperative Oral Cavity Assessment: Within Functional Limits Oral Care Completed by SLP: No Oral Cavity - Dentition: Adequate natural dentition Vision: Functional for self-feeding Self-Feeding Abilities: Able to feed self Patient Positioning: Upright in bed;Postural control adequate for testing Baseline Vocal Quality: Normal Volitional Swallow: Able to elicit    Oral/Motor/Sensory Function Overall Oral Motor/Sensory Function: Within functional limits   Ice Chips Ice chips: Within functional limits Presentation: Spoon   Thin Liquid Thin Liquid: Impaired Presentation: Straw Pharyngeal  Phase Impairments: Cough - Delayed    Nectar Thick Nectar  Thick Liquid: Not tested   Honey Thick Honey Thick Liquid: Not tested   Puree Puree: Within functional limits Presentation: Spoon   Solid     Solid: Within functional limits     Judy Bodiford I. Vear Clock, MS, CCC-SLP Acute Rehabilitation Services Office number [phone] Pager [phone]  [name] 01/09/2022,9:23 AM

## 2022-01-09 NOTE — Progress Notes (Signed)
E-Link notified for need for PRN blood pressure medication.

## 2022-01-10 ENCOUNTER — Inpatient Hospital Stay (HOSPITAL_COMMUNITY): Payer: 59

## 2022-01-10 DIAGNOSIS — K5903 Drug induced constipation: Secondary | ICD-10-CM

## 2022-01-10 DIAGNOSIS — I608 Other nontraumatic subarachnoid hemorrhage: Secondary | ICD-10-CM

## 2022-01-10 DIAGNOSIS — I609 Nontraumatic subarachnoid hemorrhage, unspecified: Secondary | ICD-10-CM | POA: Diagnosis not present

## 2022-01-10 DIAGNOSIS — I671 Cerebral aneurysm, nonruptured: Secondary | ICD-10-CM | POA: Diagnosis not present

## 2022-01-10 DIAGNOSIS — R1310 Dysphagia, unspecified: Secondary | ICD-10-CM

## 2022-01-10 DIAGNOSIS — G9340 Encephalopathy, unspecified: Secondary | ICD-10-CM | POA: Diagnosis not present

## 2022-01-10 LAB — GLUCOSE, CAPILLARY
Glucose-Capillary: 128 mg/dL — ABNORMAL HIGH (ref 70–99)
Glucose-Capillary: 124 mg/dL — ABNORMAL HIGH (ref 70–99)
Glucose-Capillary: 127 mg/dL — ABNORMAL HIGH (ref 70–99)
Glucose-Capillary: 121 mg/dL — ABNORMAL HIGH (ref 70–99)
Glucose-Capillary: 118 mg/dL — ABNORMAL HIGH (ref 70–99)
Glucose-Capillary: 119 mg/dL — ABNORMAL HIGH (ref 70–99)

## 2022-01-10 LAB — BASIC METABOLIC PANEL
Anion gap: 8 (ref 5–15)
BUN: 11 mg/dL (ref 6–20)
CO2: 22 mmol/L (ref 22–32)
Calcium: 8.6 mg/dL — ABNORMAL LOW (ref 8.9–10.3)
Chloride: 109 mmol/L (ref 98–111)
Creatinine, Ser: 0.59 mg/dL (ref 0.44–1.00)
GFR, Estimated: >60 mL/min (ref >60)
Glucose, Bld: 124 mg/dL — ABNORMAL HIGH (ref 70–99)
Potassium: 3.7 mmol/L (ref 3.5–5.1)
Sodium: 139 mmol/L (ref 135–145)

## 2022-01-10 LAB — CBC
HCT: 35.7 % — ABNORMAL LOW (ref 36.0–46.0)
Hemoglobin: 12.1 g/dL (ref 12.0–15.0)
MCH: 30 pg (ref 26.0–34.0)
MCHC: 33.9 g/dL (ref 30.0–36.0)
MCV: 88.6 fL (ref 80.0–100.0)
Platelets: 220 10*3/uL (ref 150–400)
RBC: 4.03 MIL/uL (ref 3.87–5.11)
RDW: 11.9 % (ref 11.5–15.5)
WBC: 5.8 10*3/uL (ref 4.0–10.5)
nRBC: 0 % (ref 0.0–0.2)

## 2022-01-10 LAB — MAGNESIUM: Magnesium: 2 mg/dL (ref 1.7–2.4)

## 2022-01-10 LAB — PHOSPHORUS: Phosphorus: 4.2 mg/dL (ref 2.5–4.6)

## 2022-01-10 MED ORDER — POLYETHYLENE GLYCOL 3350 17 G PO PACK
17.0000 g | PACK | Freq: Every day | ORAL | Status: DC
Start: 2022-01-10 — End: 2022-01-11
  Administered 2022-01-10 – 2022-01-11 (×2): 17 g
  Filled 2022-01-10 (×2): qty 1

## 2022-01-10 MED ORDER — GABAPENTIN 100 MG PO CAPS
200.0000 mg | ORAL_CAPSULE | Freq: Three times a day (TID) | ORAL | Status: DC
  Administered 2022-01-10 – 2022-01-14 (×12): 200 mg via ORAL
  Filled 2022-01-10 (×12): qty 2

## 2022-01-10 MED ORDER — BISACODYL 10 MG RE SUPP: 10.0000 mg | Freq: Every day | RECTAL | Status: DC | PRN

## 2022-01-10 MED ORDER — GABAPENTIN 250 MG/5ML PO SOLN
200.0000 mg | Freq: Three times a day (TID) | ORAL | Status: DC
Start: 2022-01-10 — End: 2022-01-10
  Filled 2022-01-10 (×2): qty 4

## 2022-01-10 MED ORDER — DEXMEDETOMIDINE HCL IN NACL 400 MCG/100ML IV SOLN
0.0000 ug/kg/h | INTRAVENOUS | Status: DC
  Administered 2022-01-11 – 2022-01-13 (×3): 0.4 ug/kg/h via INTRAVENOUS
  Filled 2022-01-10 (×3): qty 100

## 2022-01-10 MED ORDER — OXYCODONE-ACETAMINOPHEN 5-325 MG PO TABS
1.0000 | ORAL_TABLET | ORAL | Status: DC | PRN
  Filled 2022-01-10: qty 1

## 2022-01-10 MED ORDER — POTASSIUM CHLORIDE 20 MEQ PO PACK
40.0000 meq | PACK | Freq: Once | ORAL | Status: AC
  Administered 2022-01-10: 40 meq
  Filled 2022-01-10: qty 2

## 2022-01-10 MED ORDER — OXYCODONE-ACETAMINOPHEN 5-325 MG PO TABS
2.0000 | ORAL_TABLET | ORAL | Status: DC | PRN
  Administered 2022-01-10 – 2022-01-12 (×8): 2 via ORAL
  Filled 2022-01-10 (×10): qty 2

## 2022-01-10 MED ORDER — HEPARIN SODIUM (PORCINE) 5000 UNIT/ML IJ SOLN
5000.0000 [IU] | Freq: Three times a day (TID) | INTRAMUSCULAR | Status: DC
  Administered 2022-01-10 – 2022-01-14 (×12): 5000 [IU] via SUBCUTANEOUS
  Filled 2022-01-10 (×11): qty 1

## 2022-01-10 NOTE — Progress Notes (Signed)
  NEUROSURGERY PROGRESS NOTE   Pt seen and examined. No issues overnight. Remains somewhat agitated but off precedex this am.  EXAM: Temp:  [98 F (36.7 C)-100.5 F (38.1 C)] 99 F (37.2 C) (06/16 1208) Pulse Rate:  [46-84] 66 (06/16 1400) Resp:  [10-22] 18 (06/16 1400) BP: (110-165)/(66-96) 129/96 (06/16 1400) SpO2:  [87 %-100 %] 98 % (06/16 1400) Intake/Output      06/15 0701 06/16 0700 06/16 0701 06/17 0700   I.V. (mL/kg) 99 (1.3) 2.9 (0)   NG/GT 1630 0   IV Piggyback 99.9    Total Intake(mL/kg) 1828.8 (24.6) 2.9 (0)   Urine (mL/kg/hr) 475 (0.3)    Drains 438 116   Total Output 913 116   Net +915.8 -113.1         Awake, alert,  Answers questions appropriately CN grossly intact Follows commands briskly x4 good strength EVD in place, bloody CSF draining Right groin soft  LABS: Lab Results  Component Value Date   CREATININE 0.59 01/10/2022   BUN 11 01/10/2022   NA 139 01/10/2022   K 3.7 01/10/2022   CL 109 01/10/2022   CO2 22 01/10/2022   Lab Results  Component Value Date   WBC 5.8 01/10/2022   HGB 12.1 01/10/2022   HCT 35.7 (L) 01/10/2022   MCV 88.6 01/10/2022   PLT 220 01/10/2022     IMPRESSION: - 46 y.o. female SAH d# 4 s/p coil embo Acom/Left Pcom aneurysms. 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 All questions answered.   Lisbeth Renshaw, MD [facility]

## 2022-01-10 NOTE — Progress Notes (Signed)
Patient had several attempts to void in bedpan but unsuccessful . In and out cath x2 this shift see flow sheet. Discussed with Dr. Chestine Spore this am they did not want a indwelling catheter placed.

## 2022-01-10 NOTE — Progress Notes (Signed)
Dr Conchita Paris at bedside to see patient verified that he wants the drain at 39mm mercury and do not drain more that 10-15 ml per hour. Order paced in Epic as a verbal

## 2022-01-10 NOTE — Progress Notes (Signed)
Transcranial Doppler  Date POD PCO2 HCT BP  MCA ACA PCA OPHT SIPH VERT Basilar  6/13 GC     Right  Left   28  39   *  -31   36  17   19  11   19  14    *  *   *      6/14 RH     Right  Left   82  73   *  -28   40  21   23  21    54  53   -44  -58   -71      6/16 RH     Right  Left   63  73   -34  -25   23  23   18  16    50  43   -46  -17   -42            Right  Left                                             Right  Left                                            Right  Left                                            Right  Left                                        MCA = Middle Cerebral Artery      OPHT = Opthalmic Artery     BASILAR = Basilar Artery   ACA = Anterior Cerebral Artery     SIPH = Carotid Siphon PCA = Posterior Cerebral Artery   VERT = Verterbral Artery                   Normal MCA = 62+\-12 ACA = 50+\-12 PCA = 42+\-23    * = Unable to insonate  RT [place] = 2.86 , LT [place]ard = 3.65  , RDMS, RVT

## 2022-01-10 NOTE — Progress Notes (Signed)
Speech Language Pathology Treatment: Dysphagia  Patient Details Name: DWANNA GOSHERT MRN: [id number] DOB: May 09, 1976 Today's Date: 01/10/2022 Time: 9798-9211 SLP Time Calculation (min) (ACUTE ONLY): 22 min  Assessment / Plan / Recommendation Clinical Impression  Pt seen for ongoing dysphagia management. Now off precedex per RN and more alert. Pt was drowsy on SLP arrival and quick to fall asleep when SLP briefly left to thicken liquid, but was able to rouse and is motivated to resume PO intake. Pt required 2 swallows with ice chips and spoon amounts of water.  With single straw sips there was intermittent coughing and multiple swallows ranging from 2-4.  Pt endorsed that she was having a hard time getting things down, "maybe".  With nectar thick liquid, cough was eliminated and multiple swallows decreased in frequency and amount with some single swallows noted.  Pt tolerated solids and puree without overt s/s of aspiration and exhibited good oral clearance.   Recommend regular texture diet with nectar thick liquids.  SLP to follow for diet tolerance/advancement v possible instrumental assessment should symptoms persist.   Further cognitive linguistic assessment deferred today 2/2 improving, but continued lethargy.  Pt indicated that she would prefer to wait for further assessment at this time.    HPI HPI: Pt is a 46 year old female who presented to the ED with sudden onset of severe headache as well as nausea and vomiting.  CT head 6/12: Moderate volume of acute subarachnoid hemorrhage along the falx supresellar cister, sylvian fissures and basal cistern. Repeat CT head 6/12 revealed  worsening moderate-volume subarachnoid hemorrhage with increased cerebral edema and sulcal crowding. 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 Decreased responsiveness noted in the ED and pt intubated. ETT 6/12-6/14.       SLP Plan  Continue with current plan of care      Recommendations for follow up therapy are one component of a multi-disciplinary discharge planning process, led by the attending physician.  Recommendations may be updated based on patient status, additional functional criteria and insurance authorization.    Recommendations  Diet recommendations: Regular;Nectar-thick liquid Liquids provided via: Cup;Straw Medication Administration: Whole meds with liquid (Or via alternate means) Compensations: Slow rate;Small sips/bites Postural Changes and/or Swallow Maneuvers: Seated upright 90 degrees (as tolerated with EVD)                Oral Care Recommendations: Oral care BID Follow Up Recommendations: Acute inpatient rehab (3hours/day) Assistance recommended at discharge: Frequent or constant Supervision/Assistance SLP Visit Diagnosis: Dysphagia, unspecified (R13.10) Plan: Continue with current plan of care           Kerrie Pleasure, MA, CCC-SLP Acute Rehabilitation Services Office: [phone] 01/10/2022, 12:00 PM

## 2022-01-10 NOTE — Progress Notes (Signed)
Occupational Therapy Treatment Patient Details Name: Judy Walsh MRN: [id number] DOB: April 30, 1976 Today's Date: 01/10/2022   History of present illness Judy Walsh is a 46 yo female who presented 6/12 with severe headache with nausea and vomiting, found to have subarachnoid hemorrhage with eventual development of cerebral edema requiring emergent placement of EVD. 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 PMHx: myopericarditis.   OT comments  Pt progressing.Pt standing x2 with modA+2 for standing assist, b/l knees blocked to assist with stability. Pt with posterior and lateral lean at times. Poor trunk support. Limited ADL tasks as pt arouses for 2-3 seconds at a time with eyes closed throughout. Pt following 1 step commands 75% of the time with no verbalizations made. Family in room. Pt smiles when she sees her Judy Walsh. Pt would greatly benefit from continued OT skilled services. OT following acutely.   Recommendations for follow up therapy are one component of a multi-disciplinary discharge planning process, led by the attending physician.  Recommendations may be updated based on patient status, additional functional criteria and insurance authorization.    Follow Up Recommendations  Acute inpatient rehab (3hours/day)    Assistance Recommended at Discharge Frequent or constant Supervision/Assistance  Patient can return home with the following  A lot of help with walking and/or transfers;A lot of help with bathing/dressing/bathroom;Assistance with cooking/housework;Direct supervision/assist for medications management;Assist for transportation;Help with stairs or ramp for entrance   Equipment Recommendations  BSC/3in1;Wheelchair (measurements OT);Wheelchair cushion (measurements OT)    Recommendations for Other Services Rehab consult    Precautions / Restrictions Precautions Precautions: Fall Precaution  Comments: EVD, cortrak Restrictions Weight Bearing Restrictions: No       Mobility Bed Mobility Overal bed mobility: Needs Assistance Bed Mobility: Supine to Sit, Sit to Supine     Supine to sit: Mod assist, +2 for physical assistance, HOB elevated Sit to supine: Min guard, HOB elevated   General bed mobility comments: pt with less initiation of LE to move towards EOB than prior sessions, modA of 2 to initiate with trunk and UE    Transfers Overall transfer level: Needs assistance Equipment used: 2 person hand held assist Transfers: Sit to/from Stand Sit to Stand: Min assist, +2 physical assistance           General transfer comment: minA to rise, up to modA of 2 at times to steady, pt able to extend at knees today, progressive posterior lean with fatigue as pt maintained standing.     Balance Overall balance assessment: Needs assistance Sitting-balance support: Bilateral upper extremity supported, Feet supported Sitting balance-Leahy Scale: Poor Sitting balance - Comments: mostly minG to maintain static sitting, does use BUE support Postural control: Posterior lean Standing balance support: Bilateral upper extremity supported, During functional activity Standing balance-Leahy Scale: Poor Standing balance comment: dependent on BUE support and up to modA to steady                           ADL either performed or assessed with clinical judgement   ADL Overall ADL's : Needs assistance/impaired Eating/Feeding: NPO                       Toilet Transfer: Moderate assistance;+2 for physical assistance;+2 for safety/equipment Toilet Transfer Details (indicate cue type and reason): standing x2 with modA+2 for standign assist, b/l knees blocked to assist with stability. Pt with posterior and lateral lean  at times. Poor trunk support.         Functional mobility during ADLs: Moderate assistance;+2 for physical assistance;+2 for safety/equipment;Cueing  for safety;Cueing for sequencing General ADL Comments: Limited ADL tasks as pt arouses for 2-3 seconds at a time with eyes closed throughout. Pt following 1 step commnds 75% of the time with no verbalizations made. FAmily in room. Pt smiles when she sees her Judy Walsh.    Extremity/Trunk Assessment Upper Extremity Assessment Upper Extremity Assessment: Generalized weakness   Lower Extremity Assessment Lower Extremity Assessment: Generalized weakness;Defer to PT evaluation        Vision   Vision Assessment?: Vision impaired- to be further tested in functional context Additional Comments: can keep eyes open 2-3 seconds at a time; unable to obtain visual assessment   Perception     Praxis      Cognition Arousal/Alertness: Lethargic Behavior During Therapy: Flat affect Overall Cognitive Status: Difficult to assess                                 General Comments: difficult to fully assess due to lethargy and limited vocalizations, best with yes/no at this time. following simple commands briskly but for short time due to fatigue. pt mouthing "bye" appropriately at end of session, demos good recognition of family members        Exercises      Shoulder Instructions       General Comments BP stable: sitting: 136/88, 80 BPM standing; sitting; 124/70, 62 BPM, O2 >95% on RA.    Pertinent Vitals/ Pain       Pain Assessment Pain Assessment: No/denies pain Faces Pain Scale: No hurt Pain Intervention(s): Monitored during session  Home Living                                          Prior Functioning/Environment              Frequency  Min 2X/week        Progress Toward Goals  OT Goals(current goals can now be found in the care plan section)  Progress towards OT goals: Progressing toward goals  Acute Rehab OT Goals Patient Stated Goal: unable to state; family expressed wanting pt to stand to transfer to toilet to sit down on commode for  toileting. OT Goal Formulation: With patient/family Time For Goal Achievement: 01/22/22 Potential to Achieve Goals: Good  Plan Discharge plan remains appropriate    Co-evaluation    PT/OT/SLP Co-Evaluation/Treatment: Yes Reason for Co-Treatment: Complexity of the patient's impairments (multi-system involvement);For patient/therapist safety;To address functional/ADL transfers   OT goals addressed during session: ADL's and self-care      AM-PAC OT "6 Clicks" Daily Activity     Outcome Measure   Help from another person eating meals?: Total Help from another person taking care of personal grooming?: A Lot Help from another person toileting, which includes using toliet, bedpan, or urinal?: Total Help from another person bathing (including washing, rinsing, drying)?: A Lot Help from another person to put on and taking off regular upper body clothing?: A Lot Help from another person to put on and taking off regular lower body clothing?: Total 6 Click Score: 9    End of Session Equipment Utilized During Treatment: Gait belt  OT Visit Diagnosis: Unsteadiness on feet (R26.81);Other abnormalities of gait  and mobility (R26.89);Muscle weakness (generalized) (M62.81);Pain   Activity Tolerance Patient tolerated treatment well   Patient Left in bed;with call bell/phone within reach;with bed alarm set;with family/visitor present   Nurse Communication Mobility status        Time: 1345-1409 OT Time Calculation (min): 24 min  Charges: OT General Charges $OT Visit: 1 Visit OT Treatments $Neuromuscular Re-education: 8-22 mins  Flora Lipps, OTR/L Acute Rehabilitation Services Office: [phone]   Lonzo Cloud 01/10/2022, 3:11 PM

## 2022-01-10 NOTE — Progress Notes (Signed)
[facility] ADULT ICU REPLACEMENT PROTOCOL   The patient does apply for the [facility] Adult ICU Electrolyte Replacment Protocol based on the criteria listed below:   1.Exclusion criteria: TCTS patients, ECMO patients, and Dialysis patients 2. Is GFR >/= 30 ml/min? Yes.    Patient's GFR today is >60 3. Is SCr </= 2? Yes.   Patient's SCr is 0.59 mg/dL 4. Did SCr increase >/= 0.5 in 24 hours? No. 5.Pt's weight >40kg  Yes.   6. Abnormal electrolyte(s): K+ 3.7  7. Electrolytes replaced per protocol 8.  Call MD STAT for K+ </= 2.5, Phos </= 1, or Mag </= 1 Physician:  n/a  Melvern Banker 01/10/2022 5:25 AM

## 2022-01-10 NOTE — Progress Notes (Signed)
Physical Therapy Treatment Patient Details Name: Judy Walsh MRN: [id number] DOB: 1975-10-07 Today's Date: 01/10/2022   History of Present Illness [name] is a 46 yo female who presented 6/12 with severe headache with nausea and vomiting, found to have subarachnoid hemorrhage with eventual development of cerebral edema requiring emergent placement of EVD. 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 PMHx: myopericarditis.    PT Comments    The pt presents with continued lethargy, will open eyes to stimuli for short duration, best attention to voice and conversation of family members. The pt was able to assist with some movement of LE to complete transition to sitting EOB and completed x2 standing trials for 15-30 seconds. The pt stands with minA of 2, but fatigues quickly, needing modA of 2 and max cues to maintain attention to task to maintain upright. All VSS with activity, pt does not report pain, but mobility progression limited by lethargy and pt inability to maintain eyes open with movement.     Recommendations for follow up therapy are one component of a multi-disciplinary discharge planning process, led by the attending physician.  Recommendations may be updated based on patient status, additional functional criteria and insurance authorization.  Follow Up Recommendations  Acute inpatient rehab (3hours/day)     Assistance Recommended at Discharge Frequent or constant Supervision/Assistance  Patient can return home with the following Two people to help with walking and/or transfers;A lot of help with bathing/dressing/bathroom;Assistance with cooking/housework;Assistance with feeding;Direct supervision/assist for financial management;Direct supervision/assist for medications management;Assist for transportation;Help with stairs or ramp for entrance   Equipment Recommendations   (defer to post acute)     Recommendations for Other Services Rehab consult     Precautions / Restrictions Precautions Precautions: Fall Precaution Comments: EVD, cortrak Restrictions Weight Bearing Restrictions: No     Mobility  Bed Mobility Overal bed mobility: Needs Assistance Bed Mobility: Supine to Sit, Sit to Supine     Supine to sit: Mod assist, +2 for physical assistance, HOB elevated Sit to supine: Min guard, HOB elevated   General bed mobility comments: pt with less initiation of LE to move towards EOB than prior sessions, modA of 2 to initiate with trunk and UE    Transfers Overall transfer level: Needs assistance Equipment used: 2 person hand held assist Transfers: Sit to/from Stand Sit to Stand: Min assist, +2 physical assistance           General transfer comment: minA to rise, up to modA of 2 at times to steady, pt able to extend at knees today, progressive posterior lean with fatigue as pt maintained standing.    Ambulation/Gait               General Gait Details: pt unable to take steps at this time   Modified Rankin (Stroke Patients Only) Modified Rankin (Stroke Patients Only) Pre-Morbid Rankin Score: No symptoms Modified Rankin: Severe disability     Balance Overall balance assessment: Needs assistance Sitting-balance support: Bilateral upper extremity supported, Feet supported Sitting balance-Leahy Scale: Poor Sitting balance - Comments: mostly minG to maintain static sitting, does use BUE support Postural control: Posterior lean Standing balance support: Bilateral upper extremity supported, During functional activity Standing balance-Leahy Scale: Poor Standing balance comment: dependent on BUE support and up to modA to steady  Cognition Arousal/Alertness: Lethargic Behavior During Therapy: Flat affect Overall Cognitive Status: Difficult to assess                                 General Comments:  difficult to fully assess due to lethargy and limited vocalizations, best with yes/no at this time. following simple commands briskly but for short time due to fatigue. pt mouthing "bye" appropriately at end of session, demos good recognition of family members        Exercises      General Comments General comments (skin integrity, edema, etc.): BP stable with all movement, family present and supportive      Pertinent Vitals/Pain Pain Assessment Pain Assessment: No/denies pain Faces Pain Scale: No hurt Pain Intervention(s): Monitored during session     PT Goals (current goals can now be found in the care plan section) Acute Rehab PT Goals Patient Stated Goal: none stated PT Goal Formulation: With patient Time For Goal Achievement: 01/22/22 Potential to Achieve Goals: Good Progress towards PT goals: Not progressing toward goals - comment (remains limited by lethargy)    Frequency    Min 4X/week      PT Plan Current plan remains appropriate    Co-evaluation PT/OT/SLP Co-Evaluation/Treatment: Yes Reason for Co-Treatment: Complexity of the patient's impairments (multi-system involvement);Necessary to address cognition/behavior during functional activity;For patient/therapist safety;To address functional/ADL transfers          AM-PAC PT "6 Clicks" Mobility   Outcome Measure  Help needed turning from your back to your side while in a flat bed without using bedrails?: A Lot Help needed moving from lying on your back to sitting on the side of a flat bed without using bedrails?: A Lot Help needed moving to and from a bed to a chair (including a wheelchair)?: Total Help needed standing up from a chair using your arms (e.g., wheelchair or bedside chair)?: Total Help needed to walk in hospital room?: Total Help needed climbing 3-5 steps with a railing? : Total 6 Click Score: 8    End of Session Equipment Utilized During Treatment: Gait belt Activity Tolerance: Patient  limited by lethargy Patient left: in bed;with call bell/phone within reach;with bed alarm set;with nursing/sitter in room;with family/visitor present;with restraints reapplied Nurse Communication: Mobility status PT Visit Diagnosis: Other abnormalities of gait and mobility (R26.89);Muscle weakness (generalized) (M62.81);Hemiplegia and hemiparesis Hemiplegia - Right/Left: Right Hemiplegia - dominant/non-dominant: Dominant Hemiplegia - caused by: Nontraumatic SAH     Time: 7253-6644 PT Time Calculation (min) (ACUTE ONLY): 23 min  Charges:  $Therapeutic Activity: 8-22 mins                     Vickki Muff, PT, DPT   Acute Rehabilitation Department   Ronnie Derby 01/10/2022, 2:40 PM

## 2022-01-10 NOTE — Progress Notes (Signed)
NAME:  Judy Walsh, MRN:  [id number], DOB:  09/18/1975, LOS: 4 ADMISSION DATE:  01/06/2022, CONSULTATION DATE:  01/07/22 REFERRING MD:  Cram/Meyran, CHIEF COMPLAINT:  headache, n/v  History of Present Illness:  Severe sudden onset headache 6/12. CT head with moderate acute SAH along the falx supresellar cister, sylvian fissures and basal cistern. Neurosurger consulted. EVD placed at bedside. Throughout ED stay with decreased responsiveness requiring intubation. PCCM consulted   Pertinent  Medical History  Nodular opacities in RUL and L lung.  Myopericarditis hx  Meds: colchicine, ibuprofen, pepcid  Significant Hospital Events: Including procedures, antibiotic start and stop dates in addition to other pertinent events   6/12 presented with significant severe headache on admission found to have subarachnoid hemorrhage with eventual development of cerebral edema requiring placement of EVD.  PCCM consulted for respiratory compromise and intubation  6/13 patient has been stable post EVD placement, tolerating ventilator. Underwent coiling of aneurysm   6/14 Currently tolerating vent went  Interim History / Subjective:  Overnight with severe agitation requiring 4 point restraints and Precedex gtt.   Objective   Blood pressure 136/80, pulse (!) 49, temperature 98.5 F (36.9 C), temperature source Oral, resp. rate 15, height 5\' 8"  (1.727 m), weight 74.3 kg, SpO2 98 %.        Intake/Output Summary (Last 24 hours) at 01/10/2022 0932 Last data filed at 01/10/2022 0900 Gross per 24 hour  Intake 1773.31 ml  Output 944 ml  Net 829.31 ml   Filed Weights   01/06/22 1644 01/08/22 0630  Weight: 76.2 kg 74.3 kg    Examination: General: Adult female, lying in bed, no distress noted  HEENT: EVD in place > set to 10, small bore NG in pace  Neuro: Opens eyes with physical stimulation, pupils 3 mm bilaterally and reactive, follows commands, 4/5 strength throughout  CV: s1s2 brady, HR 55, no  mRG PULM:  Clear to ascultation, no accessory muscle use GI: soft, active bowel sounds  Extremities: warm/dry, no edema  Skin: no rashes or lesions  Resolved Hospital Problem list     Assessment & Plan:   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  Initially no signs of edema seen but patient decompensated requiring intubation for airway protection and placement of EVD P: Management per neurology/NSGY Daily TCDs  Maintain neuro protective measures; goal for eurothermia, euglycemia, eunatermia, normoxia Nutrition and bowel regiment  Seizure precautions  Aspirations precautions  Continue Keppra and Nimotop  Remains on prophylactic Ceftriaxone for EVD >> Discuss ending time with NSY Will discuss DVT ppx anticoagulation with NSY  Agitation  -EKG 6/16 QTC 379 Plan Titrate Precedex for RASS goal 0/-1 Continue low dose Seroquel  Acute respiratory distress secondary to [facility] -Intubated for airway protection overnight -Extubated 6/14 P: Titrate Supplemental oxygen for saturation goal >92 (currently on room air)  Head of bed elevated 30 degrees. Ensure adequate pulmonary hygiene   Febrile State, WBC 5.8 (6.3), likely secondary to [facility]  -U/A 6/15 negative  P: Follow Culture Data  Trend WBC and Fever Curve  PRN tylenol   Hypokalemia > improved  P: Trend Bmet  Supplement as needed   Constipation  Last BM prior to admit P Add Miralax  Continue Colace PRN suppository   Urinary Rentention  -Foley removed 6/14 P -bethanechol for 3 doses, started 6/15  Best  Practice (right click and "Reselect all SmartList Selections" daily)   Diet/type: tubefeeds DVT prophylaxis: SCD GI prophylaxis: PPI Code Status:  full code Last date of multidisciplinary goals of care discussion: Family updated at  bedside am of 6/13  Jovita Kussmaul, AGACNP-BC [facility]   PCCM Pgr: [phone]

## 2022-01-11 ENCOUNTER — Inpatient Hospital Stay (HOSPITAL_COMMUNITY): Payer: 59

## 2022-01-11 DIAGNOSIS — I609 Nontraumatic subarachnoid hemorrhage, unspecified: Secondary | ICD-10-CM | POA: Diagnosis not present

## 2022-01-11 LAB — CBC
HCT: 44.2 % (ref 36.0–46.0)
Hemoglobin: 15 g/dL (ref 12.0–15.0)
MCH: 30.2 pg (ref 26.0–34.0)
MCHC: 33.9 g/dL (ref 30.0–36.0)
MCV: 89.1 fL (ref 80.0–100.0)
Platelets: 302 10*3/uL (ref 150–400)
RBC: 4.96 MIL/uL (ref 3.87–5.11)
RDW: 11.9 % (ref 11.5–15.5)
WBC: 12.1 10*3/uL — ABNORMAL HIGH (ref 4.0–10.5)
nRBC: 0 % (ref 0.0–0.2)

## 2022-01-11 LAB — BASIC METABOLIC PANEL
Anion gap: 16 — ABNORMAL HIGH (ref 5–15)
BUN: 14 mg/dL (ref 6–20)
CO2: 17 mmol/L — ABNORMAL LOW (ref 22–32)
Calcium: 9.3 mg/dL (ref 8.9–10.3)
Chloride: 103 mmol/L (ref 98–111)
Creatinine, Ser: 0.74 mg/dL (ref 0.44–1.00)
GFR, Estimated: >60 mL/min (ref >60)
Glucose, Bld: 121 mg/dL — ABNORMAL HIGH (ref 70–99)
Potassium: 4.6 mmol/L (ref 3.5–5.1)
Sodium: 136 mmol/L (ref 135–145)

## 2022-01-11 LAB — GLUCOSE, CAPILLARY
Glucose-Capillary: 126 mg/dL — ABNORMAL HIGH (ref 70–99)
Glucose-Capillary: 144 mg/dL — ABNORMAL HIGH (ref 70–99)
Glucose-Capillary: 147 mg/dL — ABNORMAL HIGH (ref 70–99)
Glucose-Capillary: 153 mg/dL — ABNORMAL HIGH (ref 70–99)
Glucose-Capillary: 154 mg/dL — ABNORMAL HIGH (ref 70–99)
Glucose-Capillary: 174 mg/dL — ABNORMAL HIGH (ref 70–99)

## 2022-01-11 LAB — MAGNESIUM: Magnesium: 2.2 mg/dL (ref 1.7–2.4)

## 2022-01-11 LAB — PHOSPHORUS: Phosphorus: 4.3 mg/dL (ref 2.5–4.6)

## 2022-01-11 MED ORDER — SENNA 8.6 MG PO TABS
2.0000 | ORAL_TABLET | Freq: Every day | ORAL | Status: DC
  Administered 2022-01-11 – 2022-01-12 (×2): 17.2 mg
  Filled 2022-01-11 (×4): qty 2

## 2022-01-11 MED ORDER — HYDRALAZINE HCL 20 MG/ML IJ SOLN
10.0000 mg | INTRAMUSCULAR | Status: DC | PRN
  Administered 2022-01-11 – 2022-01-12 (×2): 10 mg via INTRAVENOUS
  Filled 2022-01-11 (×2): qty 1

## 2022-01-11 MED ORDER — PHENOBARBITAL SODIUM 65 MG/ML IJ SOLN
65.0000 mg | Freq: Once | INTRAMUSCULAR | Status: AC
  Administered 2022-01-11: 65 mg via INTRAVENOUS
  Filled 2022-01-11: qty 1

## 2022-01-11 MED ORDER — POLYETHYLENE GLYCOL 3350 17 G PO PACK
17.0000 g | PACK | Freq: Two times a day (BID) | ORAL | Status: DC
Start: 2022-01-11 — End: 2022-01-14
  Administered 2022-01-11: 17 g
  Filled 2022-01-11 (×2): qty 1

## 2022-01-11 MED ORDER — HYDRALAZINE HCL 20 MG/ML IJ SOLN: 5.0000 mg | INTRAMUSCULAR | Status: DC | PRN

## 2022-01-11 NOTE — Progress Notes (Addendum)
Patient ID: Judy Walsh, female   DOB: 08-Feb-1976, 46 y.o.   MRN: [id number] Vital signs remained stable Patient has EVD in place and has had some episodes of agitation which have responded well to Precedex.  IVC with continuous drainage.  Continue to observe clinically.

## 2022-01-11 NOTE — Progress Notes (Signed)
Patient restless, writhing in pain in bed. C/o severe HA unrelieved by multiple PRN pain medications via tube and IV push. Neuro assessment remains unchanged. Drain at [employer] as ordered with 10-41mLs of CSF draining within 2 minutes of being open. Drain clamped for remaining hour as ordered. Dr. Danielle Dess aware, STAT CTH. Patient and family educated with good understanding verbalized.

## 2022-01-11 NOTE — Progress Notes (Signed)
   NAME:  Judy Walsh, MRN:  [id number], DOB:  1975/10/05, LOS: 5 ADMISSION DATE:  01/06/2022, CONSULTATION DATE:  01/07/22 REFERRING MD:  Cram/Meyran, CHIEF COMPLAINT:  headache, n/v  History of Present Illness:  Severe sudden onset headache 6/12. CT head with moderate acute SAH along the falx supresellar cister, sylvian fissures and basal cistern. Neurosurgery consulted. EVD placed at bedside. Throughout ED stay with decreased responsiveness requiring intubation. PCCM consulted   Pertinent  Medical History  Nodular opacities in RUL and L lung.  Myopericarditis hx  Meds: colchicine, ibuprofen, pepcid  Significant Hospital Events: Including procedures, antibiotic start and stop dates in addition to other pertinent events   6/12 presented with significant severe headache on admission found to have subarachnoid hemorrhage with eventual development of cerebral edema requiring placement of EVD.  PCCM consulted for respiratory compromise and intubation  6/13 patient has been stable post EVD placement, tolerating ventilator. Underwent coiling of aneurysm   6/14 Currently tolerating vent wean, extubated 6/14  Interim History / Subjective:  Overnight with severe agitation requiring 4 point restraints and Precedex gtt.  On Precedex, Seroquel, as needed fentanyl Objective   Blood pressure 136/78, pulse (!) 57, temperature 99.8 F (37.7 C), temperature source Axillary, resp. rate (!) 6, height 5\' 8"  (1.727 m), weight 74.3 kg, SpO2 96 %.        Intake/Output Summary (Last 24 hours) at 01/11/2022 0955 Last data filed at 01/11/2022 0900 Gross per 24 hour  Intake 849.25 ml  Output 1724 ml  Net -874.75 ml   Filed Weights   01/06/22 1644 01/08/22 0630 01/11/22 0500  Weight: 76.2 kg 74.3 kg 74.3 kg    Examination: General: Does not appear to be in distress HEENT: EVD in place Neuro: Responsive, follows commands  CV: S1-S2 appreciated PULM: Clear breath sounds GI: soft, active bowel sounds   Extremities: warm/dry, no edema  Skin: no rashes or lesions  Resolved Hospital Problem list     Assessment & Plan:   Subarachnoid hemorrhage in the setting of anterior communicating and left posterior communicating aneurysms S/p EVD and coiling  successfully extubated 6/14 -EVD management per neurology/neurosurgery -Neuroprotective measures -Seizure precautions -Patient continues on Keppra and Nimotop -On prophylactic ceftriaxone will EVD in place  Agitation -On Precedex -Low-dose Seroquel  Continue pulmonary toileting Support for pulmonary hygiene  Fever -No leukocytosis -Follow culture data -Trend CBC  Constipation -Continue bowel support  Urinary retention -On bethanechol   Best Practice (right click and "Reselect all SmartList Selections" daily)   Diet/type: tubefeeds DVT prophylaxis: SCD GI prophylaxis: PPI Code Status:  full code Last date of multidisciplinary goals of care discussion: Family updated at bedside am of 6/13  The patient is critically ill with multiple organ systems failure and requires high complexity decision making for assessment and support, frequent evaluation and titration of therapies, application of advanced monitoring technologies and extensive interpretation of multiple databases. Critical Care Time devoted to patient care services described in this note independent of APP/resident time (if applicable)  is 32 minutes.   7/13 MD [facility] Personal pager: See Amion If unanswered, please page CCM On-call: #[phone]

## 2022-01-11 NOTE — Progress Notes (Signed)
SLP Cancellation Note  Patient Details Name: Judy Walsh MRN: [id number] DOB: [birth year]   Cancelled treatment:       Reason Eval/Treat Not Completed: Other (comment) (Patient not attentive enough to safely take PO's due to pain medications. SLP will f/u this PM)   Angela Nevin, MA, CCC-SLP Speech Therapy

## 2022-01-11 NOTE — Progress Notes (Signed)
PT Cancellation Note  Patient Details Name: Judy Walsh MRN: [id number] DOB: [birth year]   Cancelled Treatment:    Reason Eval/Treat Not Completed: Patient at procedure or test/unavailable;Pain limiting ability to participate. Per RN pt's pain not effectively managed, pt heading for head CT. PT will hold at this time.   Arlyss Gandy 01/11/2022, 1:42 PM

## 2022-01-12 DIAGNOSIS — I609 Nontraumatic subarachnoid hemorrhage, unspecified: Secondary | ICD-10-CM | POA: Diagnosis not present

## 2022-01-12 LAB — CBC
HCT: 40 % (ref 36.0–46.0)
Hemoglobin: 14.2 g/dL (ref 12.0–15.0)
MCH: 30.3 pg (ref 26.0–34.0)
MCHC: 35.5 g/dL (ref 30.0–36.0)
MCV: 85.3 fL (ref 80.0–100.0)
Platelets: 309 10*3/uL (ref 150–400)
RBC: 4.69 MIL/uL (ref 3.87–5.11)
RDW: 11.8 % (ref 11.5–15.5)
WBC: 11 10*3/uL — ABNORMAL HIGH (ref 4.0–10.5)
nRBC: 0 % (ref 0.0–0.2)

## 2022-01-12 LAB — BASIC METABOLIC PANEL
Anion gap: 9 (ref 5–15)
BUN: 19 mg/dL (ref 6–20)
CO2: 21 mmol/L — ABNORMAL LOW (ref 22–32)
Calcium: 9 mg/dL (ref 8.9–10.3)
Chloride: 102 mmol/L (ref 98–111)
Creatinine, Ser: 0.59 mg/dL (ref 0.44–1.00)
GFR, Estimated: >60 mL/min (ref >60)
Glucose, Bld: 152 mg/dL — ABNORMAL HIGH (ref 70–99)
Potassium: 3.8 mmol/L (ref 3.5–5.1)
Sodium: 132 mmol/L — ABNORMAL LOW (ref 135–145)

## 2022-01-12 LAB — GLUCOSE, CAPILLARY
Glucose-Capillary: 150 mg/dL — ABNORMAL HIGH (ref 70–99)
Glucose-Capillary: 116 mg/dL — ABNORMAL HIGH (ref 70–99)
Glucose-Capillary: 126 mg/dL — ABNORMAL HIGH (ref 70–99)
Glucose-Capillary: 116 mg/dL — ABNORMAL HIGH (ref 70–99)
Glucose-Capillary: 125 mg/dL — ABNORMAL HIGH (ref 70–99)
Glucose-Capillary: 139 mg/dL — ABNORMAL HIGH (ref 70–99)

## 2022-01-12 MED ORDER — FENTANYL CITRATE PF 50 MCG/ML IJ SOSY
50.0000 ug | PREFILLED_SYRINGE | INTRAMUSCULAR | Status: DC | PRN
  Administered 2022-01-12 – 2022-01-13 (×4): 50 ug via INTRAVENOUS
  Filled 2022-01-12 (×4): qty 1

## 2022-01-12 MED ORDER — BETHANECHOL CHLORIDE 25 MG PO TABS
25.0000 mg | ORAL_TABLET | Freq: Three times a day (TID) | ORAL | Status: DC
  Administered 2022-01-12 – 2022-01-14 (×6): 25 mg
  Filled 2022-01-12 (×6): qty 1

## 2022-01-12 MED ORDER — QUETIAPINE FUMARATE 25 MG PO TABS
50.0000 mg | ORAL_TABLET | Freq: Two times a day (BID) | ORAL | Status: DC
Start: 2022-01-12 — End: 2022-01-13
  Administered 2022-01-12 – 2022-01-13 (×3): 50 mg
  Filled 2022-01-12 (×3): qty 2

## 2022-01-12 NOTE — Progress Notes (Signed)
Patient ID: Judy Walsh, female   DOB: 1975/09/11, 46 y.o.   MRN: [id number] Vital signs are stable Patient seems to be tolerating having the EVD clamped She is still on Precedex however it seems her agitation was worse immediately upon draining CSF For the time being we will continue to observe the patient with the IVC clamped.

## 2022-01-12 NOTE — Progress Notes (Signed)
Physical Therapy Treatment Patient Details Name: Judy Walsh MRN: [id number] DOB: Feb 29, 1976 Today's Date: 01/12/2022   History of Present Illness Stepfanie Yott is a 46 yo female who presented 6/12 with severe headache with nausea and vomiting, found to have subarachnoid hemorrhage with eventual development of cerebral edema requiring emergent placement of EVD. 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 PMHx: myopericarditis.    PT Comments    Session limited due to pt lethargy/level of arousal in setting of medication. Per RN, pt with continued poor pain control/management despite current regimen. Focus of session on promoting arousal, PROM, and static sitting balance. Pt tolerated sitting EOB ~15 minutes; BP 95/73 (81), SpO2 96% on RA, HR 67. Will continue efforts. Suspect she will progress once more alert and with pain control based on age and PLOF.    Recommendations for follow up therapy are one component of a multi-disciplinary discharge planning process, led by the attending physician.  Recommendations may be updated based on patient status, additional functional criteria and insurance authorization.  Follow Up Recommendations  Acute inpatient rehab (3hours/day)     Assistance Recommended at Discharge Frequent or constant Supervision/Assistance  Patient can return home with the following Two people to help with walking and/or transfers;A lot of help with bathing/dressing/bathroom;Assistance with cooking/housework;Assistance with feeding;Direct supervision/assist for financial management;Direct supervision/assist for medications management;Assist for transportation;Help with stairs or ramp for entrance   Equipment Recommendations   (defer to post acute)    Recommendations for Other Services Rehab consult     Precautions / Restrictions Precautions Precautions: Fall Precaution Comments: EVD, cortrak,  3 point restraints Restrictions Weight Bearing Restrictions: No     Mobility  Bed Mobility Overal bed mobility: Needs Assistance Bed Mobility: Supine to Sit, Sit to Supine     Supine to sit: +2 for physical assistance, Total assist Sit to supine: Total assist, +2 for physical assistance   General bed mobility comments: No initiation noted by pt due to lethargy    Transfers                   General transfer comment: deferred    Ambulation/Gait                   Stairs             Wheelchair Mobility    Modified Rankin (Stroke Patients Only) Modified Rankin (Stroke Patients Only) Pre-Morbid Rankin Score: No symptoms Modified Rankin: Severe disability     Balance Overall balance assessment: Needs assistance Sitting-balance support: Feet supported Sitting balance-Leahy Scale: Poor Sitting balance - Comments: maxA for sitting balance, assist for cervical extension                                    Cognition Arousal/Alertness: Lethargic Behavior During Therapy: Flat affect Overall Cognitive Status: Difficult to assess                                 General Comments: difficult to fully assess due to lethargy; not following commands, moving LUE minimally        Exercises General Exercises - Upper Extremity Shoulder Flexion: PROM, Both, 5 reps, Seated Shoulder ABduction: PROM, Both, 5 reps, Seated General Exercises - Lower Extremity Long Arc Quad: PROM, Both, 5 reps, Seated  General Comments        Pertinent Vitals/Pain Pain Assessment Pain Assessment: Faces Faces Pain Scale: No hurt    Home Living                          Prior Function            PT Goals (current goals can now be found in the care plan section) Acute Rehab PT Goals Patient Stated Goal: none stated PT Goal Formulation: With patient Time For Goal Achievement: 01/22/22 Potential to Achieve Goals: Good     Frequency    Min 4X/week      PT Plan Current plan remains appropriate    Co-evaluation              AM-PAC PT "6 Clicks" Mobility   Outcome Measure  Help needed turning from your back to your side while in a flat bed without using bedrails?: Total Help needed moving from lying on your back to sitting on the side of a flat bed without using bedrails?: Total Help needed moving to and from a bed to a chair (including a wheelchair)?: Total Help needed standing up from a chair using your arms (e.g., wheelchair or bedside chair)?: Total Help needed to walk in hospital room?: Total Help needed climbing 3-5 steps with a railing? : Total 6 Click Score: 6    End of Session   Activity Tolerance: Patient limited by lethargy Patient left: in bed;with call bell/phone within reach;with bed alarm set;with family/visitor present;with restraints reapplied Nurse Communication: Mobility status PT Visit Diagnosis: Other abnormalities of gait and mobility (R26.89);Muscle weakness (generalized) (M62.81);Hemiplegia and hemiparesis Hemiplegia - Right/Left: Right Hemiplegia - dominant/non-dominant: Dominant Hemiplegia - caused by: Nontraumatic SAH     Time: 4492-0100 PT Time Calculation (min) (ACUTE ONLY): 33 min  Charges:  $Therapeutic Activity: 23-37 mins                     Lillia Pauls, PT, DPT Acute Rehabilitation Services Office [phone]    Norval Morton 01/12/2022, 11:57 AM

## 2022-01-12 NOTE — Progress Notes (Signed)
   NAME:  Judy Walsh, MRN:  [id number], DOB:  11-11-1975, LOS: 6 ADMISSION DATE:  01/06/2022, CONSULTATION DATE:  01/07/22 REFERRING MD:  Cram/Meyran, CHIEF COMPLAINT:  headache, n/v  History of Present Illness:  Severe sudden onset headache 6/12. CT head with moderate acute SAH along the falx supresellar cister, sylvian fissures and basal cistern. Neurosurgery consulted. EVD placed at bedside. Throughout ED stay with decreased responsiveness requiring intubation. PCCM consulted   Pertinent  Medical History  Nodular opacities in RUL and L lung.  Myopericarditis hx  Meds: colchicine, ibuprofen, pepcid  Significant Hospital Events: Including procedures, antibiotic start and stop dates in addition to other pertinent events   6/12 presented with significant severe headache on admission found to have subarachnoid hemorrhage with eventual development of cerebral edema requiring placement of EVD.  PCCM consulted for respiratory compromise and intubation  6/13 patient has been stable post EVD placement, tolerating ventilator. Underwent coiling of aneurysm   6/14 Currently tolerating vent wean, extubated 6/14 6/18 arousable, interactive, required pain medications Interim History / Subjective:  Overnight with severe agitation requiring 4 point restraints and Precedex gtt. requiring pushes of fentanyl and scheduled oxy Objective   Blood pressure 132/83, pulse 66, temperature 98.4 F (36.9 C), temperature source Oral, resp. rate 19, height 5\' 8"  (1.727 m), weight 74.3 kg, SpO2 100 %.        Intake/Output Summary (Last 24 hours) at 01/12/2022 0837 Last data filed at 01/12/2022 0805 Gross per 24 hour  Intake 1276.59 ml  Output 2036 ml  Net -759.41 ml   Filed Weights   01/08/22 0630 01/11/22 0500 01/12/22 0423  Weight: 74.3 kg 74.3 kg 74.3 kg    Examination: General: Does not appear to be in distress resting, just received fentanyl HEENT: EVD in place Neuro: She does follow commands CV:  S1-S2 appreciated PULM: Clear breath sounds GI: Active bowel sounds Extremities: warm/dry, no edema  Skin: no rashes or lesions  Resolved Hospital Problem list     Assessment & Plan:   Subarachnoid hemorrhage in the setting of anterior recommend occasional left posterior communicating aneurysms, status post coiling S/p EVD placement Successfully extubated 6/14 -EVD management per neurosurgery -Continue neuroprotective measures -Seizure precautions -On Keppra and pneumothorax -Continue ceftriaxone for prophylaxis  Agitation -On Precedex -Low-dose Seroquel will increase Seroquel to 50 twice daily  Continue pulmonary toileting  Tmax 101.9 -No significant leukocytosis -Cultures negative to date  Finally able to move bowel   Best Practice (right click and "Reselect all SmartList Selections" daily)   Diet/type: tubefeeds DVT prophylaxis: SCD GI prophylaxis: PPI Code Status:  full code Last date of multidisciplinary goals of care discussion: Family updated at bedside am of 6/18  Critical care time

## 2022-01-12 NOTE — Progress Notes (Signed)
SLP Cancellation Note  Patient Details Name: Judy Walsh MRN: [id number] DOB: August 09, 1975   Cancelled treatment:       Reason Eval/Treat Not Completed: Fatigue/lethargy limiting ability to participate. Per RN patient's pain is better controlled today but she is very drowsy and not appropriate for PO trials. SLP will f/u next date.   Angela Nevin, MA, CCC-SLP Speech Therapy

## 2022-01-13 ENCOUNTER — Inpatient Hospital Stay (HOSPITAL_COMMUNITY): Payer: 59

## 2022-01-13 DIAGNOSIS — I609 Nontraumatic subarachnoid hemorrhage, unspecified: Secondary | ICD-10-CM | POA: Diagnosis not present

## 2022-01-13 DIAGNOSIS — G919 Hydrocephalus, unspecified: Secondary | ICD-10-CM | POA: Diagnosis present

## 2022-01-13 DIAGNOSIS — R509 Fever, unspecified: Secondary | ICD-10-CM | POA: Diagnosis not present

## 2022-01-13 DIAGNOSIS — I608 Other nontraumatic subarachnoid hemorrhage: Secondary | ICD-10-CM | POA: Diagnosis not present

## 2022-01-13 DIAGNOSIS — R410 Disorientation, unspecified: Secondary | ICD-10-CM | POA: Diagnosis present

## 2022-01-13 LAB — GLUCOSE, CAPILLARY
Glucose-Capillary: 132 mg/dL — ABNORMAL HIGH (ref 70–99)
Glucose-Capillary: 139 mg/dL — ABNORMAL HIGH (ref 70–99)
Glucose-Capillary: 140 mg/dL — ABNORMAL HIGH (ref 70–99)
Glucose-Capillary: 150 mg/dL — ABNORMAL HIGH (ref 70–99)
Glucose-Capillary: 151 mg/dL — ABNORMAL HIGH (ref 70–99)
Glucose-Capillary: 166 mg/dL — ABNORMAL HIGH (ref 70–99)

## 2022-01-13 LAB — CBC
HCT: 40.6 % (ref 36.0–46.0)
Hemoglobin: 14.2 g/dL (ref 12.0–15.0)
MCH: 30 pg (ref 26.0–34.0)
MCHC: 35 g/dL (ref 30.0–36.0)
MCV: 85.7 fL (ref 80.0–100.0)
Platelets: 248 10*3/uL (ref 150–400)
RBC: 4.74 MIL/uL (ref 3.87–5.11)
RDW: 11.8 % (ref 11.5–15.5)
WBC: 9.8 10*3/uL (ref 4.0–10.5)
nRBC: 0 % (ref 0.0–0.2)

## 2022-01-13 LAB — BASIC METABOLIC PANEL
Anion gap: 13 (ref 5–15)
BUN: 19 mg/dL (ref 6–20)
CO2: 20 mmol/L — ABNORMAL LOW (ref 22–32)
Calcium: 9 mg/dL (ref 8.9–10.3)
Chloride: 100 mmol/L (ref 98–111)
Creatinine, Ser: 0.54 mg/dL (ref 0.44–1.00)
GFR, Estimated: >60 mL/min (ref >60)
Glucose, Bld: 132 mg/dL — ABNORMAL HIGH (ref 70–99)
Potassium: 4.3 mmol/L (ref 3.5–5.1)
Sodium: 133 mmol/L — ABNORMAL LOW (ref 135–145)

## 2022-01-13 MED ORDER — DEXAMETHASONE SODIUM PHOSPHATE 4 MG/ML IJ SOLN
4.0000 mg | Freq: Four times a day (QID) | INTRAMUSCULAR | Status: DC
  Administered 2022-01-13 – 2022-01-15 (×8): 4 mg via INTRAVENOUS
  Filled 2022-01-13 (×8): qty 1

## 2022-01-13 MED ORDER — BUTALBITAL-APAP-CAFFEINE 50-325-40 MG PO TABS
1.0000 | ORAL_TABLET | ORAL | Status: DC | PRN
  Administered 2022-01-13 – 2022-01-14 (×2): 2 via ORAL
  Filled 2022-01-13 (×2): qty 2

## 2022-01-13 MED ORDER — TRAMADOL HCL 50 MG PO TABS
50.0000 mg | ORAL_TABLET | Freq: Four times a day (QID) | ORAL | Status: DC | PRN
  Administered 2022-01-13 (×2): 50 mg via ORAL
  Filled 2022-01-13 (×2): qty 1

## 2022-01-13 MED ORDER — QUETIAPINE FUMARATE 25 MG PO TABS
25.0000 mg | ORAL_TABLET | Freq: Two times a day (BID) | ORAL | Status: DC
Start: 2022-01-13 — End: 2022-01-14
  Administered 2022-01-13: 25 mg
  Filled 2022-01-13: qty 1

## 2022-01-13 NOTE — Progress Notes (Signed)
Speech Language Pathology Treatment: Dysphagia;Cognitive-Linquistic  Patient Details Name: Judy Walsh MRN: [id number] DOB: 1976-02-19 Today's Date: 01/13/2022 Time: 6606-3016 SLP Time Calculation (min) (ACUTE ONLY): 24 min  Assessment / Plan / Recommendation Clinical Impression  Judy Walsh was seen for treatment with her father present for part of the session. She was lethargic throughout the session, but roused with stimulation and was willing to participate. Judy Walsh's RN reported that the Judy Walsh has been tolerating the current diet without difficulty, but that p.o. intake has been reduced. Judy Walsh was formally re-assessed with the [facility] Mental Status Examination, but Judy Walsh's lethargy likely impacted her performance. She achieved a score of 11/30 which is below the normal limits of 27 or more out of 30. She exhibited difficulty in the areas of awareness, attention, memory, orientation, problem solving, and executive function. However, her awareness was improved and she reported that her thinking appears "foggy" compared to baseline. Judy Walsh tolerated regular texture solids and thin liquids via cup and straw without overt s/sx of aspiration. Mastication was [facility] and oral clearance adequate. A single instance of a secondary swallows was noted with a larger bolus of thin liquids. Judy Walsh's diet will be advanced to regular texture solids and thin liquids. SLP will continue to follow Judy Walsh.    HPI HPI: Judy Walsh is a 46 year old female who presented to the ED with sudden onset of severe headache as well as nausea and vomiting.  CT head 6/12: Moderate volume of acute subarachnoid hemorrhage along the falx supresellar cister, sylvian fissures and basal cistern. Repeat CT head 6/12 revealed  worsening moderate-volume subarachnoid hemorrhage with increased cerebral edema and sulcal crowding. 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  Decreased responsiveness noted in the ED and Judy Walsh intubated. ETT 6/12-6/14.      SLP Plan  Continue with current plan of care      Recommendations for follow up therapy are one component of a multi-disciplinary discharge planning process, led by the attending physician.  Recommendations may be updated based on patient status, additional functional criteria and insurance authorization.    Recommendations  Diet recommendations: Regular;Thin liquid Liquids provided via: Cup;Straw Medication Administration: Whole meds with puree Supervision: Patient able to self feed Compensations: Slow rate;Small sips/bites Postural Changes and/or Swallow Maneuvers: Seated upright 90 degrees                Oral Care Recommendations: Oral care BID Follow Up Recommendations: Acute inpatient rehab (3hours/day) Assistance recommended at discharge: Frequent or constant Supervision/Assistance SLP Visit Diagnosis: Dysphagia, unspecified (R13.10);Cognitive communication deficit (R41.841) Plan: Continue with current plan of care          Mubashir Mallek I. Vear Clock, MS, CCC-SLP Acute Rehabilitation Services Office number [phone] Pager [phone]  [name]  01/13/2022, 11:02 AM

## 2022-01-13 NOTE — Assessment & Plan Note (Addendum)
H&H 2, Fischer 1 Post ictus day 10 Post coiling day 9 of ACOM and left PCOM aneurysms.  Headache generally better controlled with addition of NSAIDs  Increased velocities in right and left MCAs consistent with moderate vasospasm Non-focal examination - unchanged  - Continue IV fluids and start augmentation to SBP 150-180 - TCD today.

## 2022-01-13 NOTE — Assessment & Plan Note (Addendum)
No change in examination or headache following drain removal

## 2022-01-13 NOTE — Progress Notes (Addendum)
Transcranial Doppler  Date POD PCO2 HCT BP  MCA ACA PCA OPHT SIPH VERT Basilar  6/13 GC     Right  Left   28  39   *  -31   36  17   19  11   19  14    *  *   *      6/14 RH     Right  Left   82  73   *  -28   40  21   23  21    54  53   -44  -58   -71      6/16 RH     Right  Left   63  73   -34  -25   23  23   18  16    50  43   -46  -17   -42      6/19 MS  7  40.6 125/ 82 Right  Left   66  145   -56  -35   30 25 20  17    95  73   -51  -49   -49      6/21MS  9   133/82 Right  Left   129  157   -17  -36   38  44   19  14   40  29   -42  -24   -48           Right  Left                                            Right  Left                                        MCA = Middle Cerebral Artery      OPHT = Opthalmic Artery     BASILAR = Basilar Artery   ACA = Anterior Cerebral Artery     SIPH = Carotid Siphon PCA = Posterior Cerebral Artery   VERT = Verterbral Artery                   Normal MCA = 62+\-12 ACA = 50+\-12 PCA = 42+\-23    * = Unable to insonate  RT Lind[place]ard = 2.8 , LT Lindegaard = 5.61 Preliminary results discussed with Almeta Monas, RN.  01/15/2022 3:19 PM Eula Fried., MHA, RVT, RDCS, RDMS

## 2022-01-13 NOTE — Progress Notes (Signed)
Occupational Therapy Treatment Patient Details Name: Judy Walsh MRN: [id number] DOB: 24-Aug-1975 Today's Date: 01/13/2022   History of present illness Suzzane Quilter is a 46 yo female who presented 6/12 with severe headache with nausea and vomiting, found to have subarachnoid hemorrhage with eventual development of cerebral edema requiring emergent placement of EVD. 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 PMHx: myopericarditis.   OT comments  EVD clamped for entire session. Shann is making functional progress, session completed with PT to address safe activity progressing. Overall Jaidy demonstrated improvement in arousal, bed mobility, transfers and activity tolerance. She was able to get to the EOB with mod and transfer to the cair with mod A +2 HHA. Once in the chair she participated in grooming tasks with mod A. She continue to benefit from maximal cues throughout for cognitive deficits listed below. OT to continue to follow, d/c remains appropriate.    Recommendations for follow up therapy are one component of a multi-disciplinary discharge planning process, led by the attending physician.  Recommendations may be updated based on patient status, additional functional criteria and insurance authorization.    Follow Up Recommendations  Acute inpatient rehab (3hours/day)    Assistance Recommended at Discharge Frequent or constant Supervision/Assistance  Patient can return home with the following  A lot of help with walking and/or transfers;A lot of help with bathing/dressing/bathroom;Assistance with cooking/housework;Direct supervision/assist for medications management;Assist for transportation;Help with stairs or ramp for entrance   Equipment Recommendations  BSC/3in1;Wheelchair (measurements OT);Wheelchair cushion (measurements OT)    Recommendations for Other Services Rehab consult    Precautions /  Restrictions Precautions Precautions: Fall Precaution Comments: EVD, cortrak, posey restraint Restrictions Weight Bearing Restrictions: No       Mobility Bed Mobility Overal bed mobility: Needs Assistance Bed Mobility: Supine to Sit     Supine to sit: Mod assist     General bed mobility comments: incr time and cues required but pt wtih notable improvement in bed mobility this date    Transfers Overall transfer level: Needs assistance Equipment used: 2 person hand held assist Transfers: Sit to/from Stand, Bed to chair/wheelchair/BSC Sit to Stand: Mod assist, +2 physical assistance, +2 safety/equipment     Step pivot transfers: Mod assist, +2 physical assistance, +2 safety/equipment           Balance Overall balance assessment: Needs assistance Sitting-balance support: Feet supported Sitting balance-Leahy Scale: Poor Sitting balance - Comments: intermittent close min G, otherwise min- mod A for sitting balance   Standing balance support: Bilateral upper extremity supported, During functional activity Standing balance-Leahy Scale: Poor                             ADL either performed or assessed with clinical judgement   ADL Overall ADL's : Needs assistance/impaired     Grooming: Moderate assistance;Sitting Grooming Details (indicate cue type and reason): pt washed face this session with mod A for initiation and sustaining task; pt using non-dom LUE. Attempted hair brushing - pt declined with some agitation noted                 Toilet Transfer: Moderate assistance;+2 for physical assistance;+2 for safety/equipment;Stand-pivot Toilet Transfer Details (indicate cue type and reason): simulated. pivotal steps from bed>chair. 2 person HHA, pt's bilat knees buckling         Functional mobility during ADLs: Moderate assistance;+2 for physical assistance;+2 for safety/equipment;Cueing  for safety;Cueing for sequencing General ADL Comments: pt benefits  from cues throughout for arousal, attention, sequencing and safety    Extremity/Trunk Assessment Upper Extremity Assessment Upper Extremity Assessment: Generalized weakness;RUE deficits/detail RUE Deficits / Details: Diffult to fully assess - RUE noted to be left in unsafe positions. Pt also reacting for grooming items with non-dominant LUE RUE Sensation: decreased proprioception RUE Coordination: decreased fine motor;decreased gross motor   Lower Extremity Assessment Lower Extremity Assessment: Defer to PT evaluation        Vision   Vision Assessment?: Vision impaired- to be further tested in functional context Additional Comments: difficult to fully assess however pt tracking therapist in all quadrants this session   Perception Perception Perception: Not tested   Praxis Praxis Praxis: Not tested    Cognition Arousal/Alertness: Lethargic Behavior During Therapy: Flat affect Overall Cognitive Status: Impaired/Different from baseline Area of Impairment: Attention, Following commands, Safety/judgement, Awareness, Problem solving                   Current Attention Level: Focused   Following Commands: Follows one step commands with increased time Safety/Judgement: Decreased awareness of safety, Decreased awareness of deficits Awareness: Intellectual Problem Solving: Slow processing, Decreased initiation, Requires verbal cues General Comments: pt was alert with eyes open for the majority of the session; child-like verbiage noted throughout.        Exercises      Shoulder Instructions       General Comments BP stable, pt with complatins of back and but tpain. left in chiar with posey belt restraint per RN request. Dad present and supportive    Pertinent Vitals/ Pain       Pain Assessment Pain Assessment: Faces Faces Pain Scale: Hurts little more Pain Location: back and butt Pain Descriptors / Indicators: Discomfort, Grimacing Pain Intervention(s): Monitored  during session   Frequency  Min 2X/week        Progress Toward Goals  OT Goals(current goals can now be found in the care plan section)  Progress towards OT goals: Progressing toward goals  Acute Rehab OT Goals Patient Stated Goal: less pain OT Goal Formulation: With patient/family Time For Goal Achievement: 01/22/22 Potential to Achieve Goals: Good ADL Goals Pt Will Perform Grooming: with set-up;sitting Pt Will Perform Upper Body Dressing: with set-up;sitting Pt Will Perform Lower Body Dressing: with min assist;sit to/from stand Pt Will Transfer to Toilet: with min assist;ambulating Additional ADL Goal #1: Pt will complete bed mobility with supervision A as a precursor to ADLs  Plan Discharge plan remains appropriate    Co-evaluation    PT/OT/SLP Co-Evaluation/Treatment: Yes Reason for Co-Treatment: Complexity of the patient's impairments (multi-system involvement);For patient/therapist safety;To address functional/ADL transfers;Necessary to address cognition/behavior during functional activity   OT goals addressed during session: ADL's and self-care      AM-PAC OT "6 Clicks" Daily Activity     Outcome Measure   Help from another person eating meals?: Total Help from another person taking care of personal grooming?: A Lot Help from another person toileting, which includes using toliet, bedpan, or urinal?: A Lot Help from another person bathing (including washing, rinsing, drying)?: A Lot Help from another person to put on and taking off regular upper body clothing?: A Lot Help from another person to put on and taking off regular lower body clothing?: Total 6 Click Score: 10    End of Session Equipment Utilized During Treatment: Gait belt  OT Visit Diagnosis: Unsteadiness on feet (R26.81);Other abnormalities of gait and mobility (  R26.89);Muscle weakness (generalized) (M62.81);Pain   Activity Tolerance Patient tolerated treatment well   Patient Left in  chair;with call bell/phone within reach;with chair alarm set;with family/visitor present;with restraints reapplied   Nurse Communication Mobility status        Time: 3570-1779 OT Time Calculation (min): 32 min  Charges: OT General Charges $OT Visit: 1 Visit OT Treatments $Therapeutic Activity: 8-22 mins   Jannae Fagerstrom A Leahann Lempke 01/13/2022, 9:43 AM

## 2022-01-13 NOTE — Progress Notes (Addendum)
  NEUROSURGERY PROGRESS NOTE   Pt seen and examined. No issues overnight. C/o primarily back pain this am, sitting in bedside chair. EVD clamped now for nearly 48hrs.  EXAM: Temp:  [98.4 F (36.9 C)-100.5 F (38.1 C)] 100.1 F (37.8 C) (06/19 1149) Pulse Rate:  [60-108] 105 (06/19 1100) Resp:  [11-21] 17 (06/19 1200) BP: (102-164)/(64-98) 125/86 (06/19 1200) SpO2:  [93 %-100 %] 96 % (06/19 1100) Weight:  [74.2 kg] 74.2 kg (06/19 0430) Intake/Output      06/18 0701 06/19 0700 06/19 0701 06/20 0700   I.V. (mL/kg) 102.5 (1.4) 12.2 (0.2)   NG/GT 1100 300   Total Intake(mL/kg) 1202.5 (16.2) 312.2 (4.2)   Urine (mL/kg/hr) 1955 (1.1) 275 (0.6)   Drains     Stool 0 0   Total Output 1955 275   Net -752.5 +37.2        Stool Occurrence 3 x 1 x    Awake, alert,  Answers questions appropriately CN grossly intact Follows commands briskly x4 good strength EVD in place, clamped   LABS: Lab Results  Component Value Date   CREATININE 0.54 01/13/2022   BUN 19 01/13/2022   NA 133 (L) 01/13/2022   K 4.3 01/13/2022   CL 100 01/13/2022   CO2 20 (L) 01/13/2022   Lab Results  Component Value Date   WBC 9.8 01/13/2022   HGB 14.2 01/13/2022   HCT 40.6 01/13/2022   MCV 85.7 01/13/2022   PLT 248 01/13/2022     IMPRESSION: - 46 y.o. female SAH d# 7 s/p coil embo Acom/Left Pcom aneurysms. 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 All questions answered.   Lisbeth Renshaw, MD [facility]

## 2022-01-13 NOTE — Progress Notes (Signed)
Physical Therapy Treatment Patient Details Name: LISA-MARIE RUEGER MRN: [id number] DOB: 1976/05/14 Today's Date: 01/13/2022   History of Present Illness Talayeh Bruinsma is a 46 yo female who presented 6/12 with severe headache with nausea and vomiting, found to have subarachnoid hemorrhage with eventual development of cerebral edema requiring emergent placement of EVD. 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 PMHx: myopericarditis.    PT Comments    The pt presents with continued lethargy, but was able to maintain eyes open for majority of session today. She continues to need cues to maintain attention to task and to complete tasks in timely manner, but was able to follow all simple commands. The pt completed bed mobility with improved independence and initiation, but needed increased assist in standing due to increased frequency of bilateral knee buckling. The pt did manage short bout of steps to L to pivot to recliner, but reports increased pain in her back and fatigue after this movement. Will continue to follow and progress mobility as able, continue to recommend acute inpatient rehab when safe for d/c.     Recommendations for follow up therapy are one component of a multi-disciplinary discharge planning process, led by the attending physician.  Recommendations may be updated based on patient status, additional functional criteria and insurance authorization.  Follow Up Recommendations  Acute inpatient rehab (3hours/day)     Assistance Recommended at Discharge Frequent or constant Supervision/Assistance  Patient can return home with the following Two people to help with walking and/or transfers;A lot of help with bathing/dressing/bathroom;Assistance with cooking/housework;Assistance with feeding;Direct supervision/assist for financial management;Direct supervision/assist for medications management;Assist for  transportation;Help with stairs or ramp for entrance   Equipment Recommendations  Other (comment) (defer to post acute)    Recommendations for Other Services       Precautions / Restrictions Precautions Precautions: Fall Precaution Comments: EVD, cortrak, posey restraint (waist) Restrictions Weight Bearing Restrictions: No     Mobility  Bed Mobility Overal bed mobility: Needs Assistance Bed Mobility: Supine to Sit     Supine to sit: Mod assist     General bed mobility comments: incr time and cues required but pt wtih notable improvement in bed mobility this date    Transfers Overall transfer level: Needs assistance Equipment used: 2 person hand held assist Transfers: Sit to/from Stand, Bed to chair/wheelchair/BSC Sit to Stand: Mod assist, +2 physical assistance, +2 safety/equipment   Step pivot transfers: Mod assist, +2 physical assistance, +2 safety/equipment       General transfer comment: minA of 2 to rise to standing, at times needing bilateral knee blocking due to LE buckling. Pt then able to take lateral steps to recliner. sitting prematurely in chair.    Ambulation/Gait               General Gait Details: unable to manage more than lateral pivoting steps.      Balance Overall balance assessment: Needs assistance Sitting-balance support: Feet supported Sitting balance-Leahy Scale: Poor Sitting balance - Comments: intermittent close min G, otherwise min- mod A for sitting balance   Standing balance support: Bilateral upper extremity supported, During functional activity Standing balance-Leahy Scale: Poor                              Cognition Arousal/Alertness: Lethargic Behavior During Therapy: Flat affect Overall Cognitive Status: Impaired/Different from baseline Area of Impairment: Attention, Following commands, Safety/judgement, Awareness,  Problem solving                   Current Attention Level: Focused   Following  Commands: Follows one step commands with increased time Safety/Judgement: Decreased awareness of safety, Decreased awareness of deficits Awareness: Intellectual Problem Solving: Slow processing, Decreased initiation, Requires verbal cues General Comments: pt was alert with eyes open for the majority of the session; child-like verbiage noted throughout. remains lethargic and needing increased time to follow instructions        Exercises      General Comments General comments (skin integrity, edema, etc.): BP stable, pt with complatins of back and but tpain. left in chiar with posey belt restraint per RN request. Dad present and supportive      Pertinent Vitals/Pain Pain Assessment Pain Assessment: No/denies pain Faces Pain Scale: Hurts little more Pain Location: back and butt Pain Descriptors / Indicators: Discomfort, Grimacing Pain Intervention(s): Monitored during session, Repositioned, Patient requesting pain meds-RN notified     PT Goals (current goals can now be found in the care plan section) Acute Rehab PT Goals Patient Stated Goal: none stated PT Goal Formulation: With patient Time For Goal Achievement: 01/22/22 Potential to Achieve Goals: Good    Frequency    Min 4X/week           Co-evaluation PT/OT/SLP Co-Evaluation/Treatment: Yes Reason for Co-Treatment: Complexity of the patient's impairments (multi-system involvement);Necessary to address cognition/behavior during functional activity;For patient/therapist safety;To address functional/ADL transfers PT goals addressed during session: Mobility/safety with mobility;Balance;Strengthening/ROM OT goals addressed during session: ADL's and self-care      AM-PAC PT "6 Clicks" Mobility   Outcome Measure  Help needed turning from your back to your side while in a flat bed without using bedrails?: A Little Help needed moving from lying on your back to sitting on the side of a flat bed without using bedrails?: A  Little Help needed moving to and from a bed to a chair (including a wheelchair)?: Total Help needed standing up from a chair using your arms (e.g., wheelchair or bedside chair)?: Total Help needed to walk in hospital room?: Total Help needed climbing 3-5 steps with a railing? : Total 6 Click Score: 10    End of Session Equipment Utilized During Treatment: Gait belt Activity Tolerance: Patient limited by lethargy Patient left: in chair;with call bell/phone within reach;with chair alarm set;with family/visitor present;with restraints reapplied Nurse Communication: Mobility status PT Visit Diagnosis: Other abnormalities of gait and mobility (R26.89);Muscle weakness (generalized) (M62.81);Hemiplegia and hemiparesis Hemiplegia - Right/Left: Right Hemiplegia - dominant/non-dominant: Dominant Hemiplegia - caused by: Nontraumatic SAH     Time: 4627-0350 PT Time Calculation (min) (ACUTE ONLY): 26 min  Charges:  $Gait Training: 8-22 mins                     Vickki Muff, PT, DPT   Acute Rehabilitation Department   Ronnie Derby 01/13/2022, 11:49 AM

## 2022-01-13 NOTE — Assessment & Plan Note (Addendum)
Multifactorial due to location of hemorrhage and sleep deprivation.  Appears to have resolved.  Still some altered affect from from frontal involvement for Acom aneurysm  - q4h neuro checks.  - Keep restraints off

## 2022-01-13 NOTE — Progress Notes (Signed)
   01/13/22 1700  Provider Notification  Provider Name/Title Sharion Dove  Date Provider Notified 01/13/22  Time Provider Notified 1700  Method of Notification Call  Notification Reason Other (Comment) (TCDs per sonographer, showed vasospasm)  Test performed and critical result TCDs  Date Critical Result Received 01/13/22  Time Critical Result Received 1700  Provider response No new orders  Date of Provider Response 01/13/22  Time of Provider Response 1700

## 2022-01-13 NOTE — Assessment & Plan Note (Signed)
Fever curve improving. No leukocytosis.  - Likely central fever.  - Monitor for now.

## 2022-01-13 NOTE — Progress Notes (Signed)
NAME:  Judy Walsh, MRN:  [id number], DOB:  05-Aug-1975, LOS: 7 ADMISSION DATE:  01/06/2022, CONSULTATION DATE:  01/07/22 REFERRING MD:  Cram/Meyran, CHIEF COMPLAINT:  headache, n/v  History of Present Illness:  Severe sudden onset headache 6/12. CT head with moderate acute SAH along the falx supresellar cister, sylvian fissures and basal cistern. Neurosurgery consulted. EVD placed at bedside. Throughout ED stay with decreased responsiveness requiring intubation. PCCM consulted   Pertinent  Medical History  Nodular opacities in RUL and L lung.  Myopericarditis hx  Meds: colchicine, ibuprofen, pepcid  Significant Hospital Events: Including procedures, antibiotic start and stop dates in addition to other pertinent events   6/12 presented with significant severe headache on admission found to have subarachnoid hemorrhage with eventual development of cerebral edema requiring placement of EVD.  PCCM consulted for respiratory compromise and intubation  6/13 patient has been stable post EVD placement, tolerating ventilator. Underwent coiling of aneurysm   6/14 Currently tolerating vent wean, extubated 6/14 6/18 arousable, interactive, required pain medications Interim History / Subjective:  Overnight with severe agitation requiring 4 point restraints and Precedex gtt. requiring pushes of fentanyl and scheduled oxy Objective   Blood pressure (!) 147/84, pulse 67, temperature 99.6 F (37.6 C), temperature source Axillary, resp. rate 19, height 5\' 8"  (1.727 m), weight 74.2 kg, SpO2 97 %.        Intake/Output Summary (Last 24 hours) at 01/13/2022 0810 Last data filed at 01/13/2022 0600 Gross per 24 hour  Intake 1146.69 ml  Output 1820 ml  Net -673.31 ml    Filed Weights   01/11/22 0500 01/12/22 0423 01/13/22 0430  Weight: 74.3 kg 74.3 kg 74.2 kg    Examination: General: Does not appear to be in distress, but in restrains  HEENT: EVD in place - clamped Neuro: She does follow commands,  no focal deficits. Oriented to place and time.  CV: S1-S2 appreciated 2/5 SEM PULM: Clear breath sounds GI: Small bore feeding tube in place. Abdomen soft.  Extremities: warm/dry, no edema  Skin: no rashes or lesions  Ancillary tests personally reviewed:     Assessment & Plan:   SAH (subarachnoid hemorrhage) (HCC) H&H 2, Fischer 1 Post ictus day 7  Post coiling day 6 of ACOM and left PCOM aneurysms.  Remains lethargic.  No clear vasospasm on TCD 6/16. Exam unchanged  - Decrease neuro checks to improve sleep deprivation.  - TCD today.    Hydrocephalus (HCC) EVD remains clamped.  Minimal blood-tinged CSF  - CT scan prior to EVD removal   Acute delirium Multifactorial due to location of hemorrhage and sleep deprivation.   - Decrease frequency of examinations - Consider hemodynamic augmentation to see if exam improves, but unlikely vasospasm as examination has remained unchanged since admission. - Continue restrains to prevent self-harm during periods of agitation.   Fever Fever curve improving. No leukocytosis.  - Likely central fever.  - Monitor for now.    Best Practice (right click and "Reselect all SmartList Selections" daily)   Diet/type: tubefeeds DVT prophylaxis: SCD GI prophylaxis: PPI Code Status:  full code Last date of multidisciplinary goals of care discussion: Family updated at bedside am of 6/18  CRITICAL CARE Performed by: 7/18   Total critical care time: 35 minutes  Critical care time was exclusive of separately billable procedures and treating other patients.  Critical care was necessary to treat or prevent imminent or life-threatening deterioration.  Critical care was time spent personally by me on the following  activities: development of treatment plan with patient and/or surrogate as well as nursing, discussions with consultants, evaluation of patient's response to treatment, examination of patient, obtaining history from  patient or surrogate, ordering and performing treatments and interventions, ordering and review of laboratory studies, ordering and review of radiographic studies, pulse oximetry, re-evaluation of patient's condition and participation in multidisciplinary rounds.  Lynnell Catalan, MD [facility] ICU Physician [facility] [place] Critical Care  Pager: [phone] Mobile: [phone] After hours: [phone].

## 2022-01-14 ENCOUNTER — Inpatient Hospital Stay (HOSPITAL_COMMUNITY): Payer: 59

## 2022-01-14 DIAGNOSIS — R519 Headache, unspecified: Secondary | ICD-10-CM | POA: Diagnosis not present

## 2022-01-14 DIAGNOSIS — I609 Nontraumatic subarachnoid hemorrhage, unspecified: Secondary | ICD-10-CM | POA: Diagnosis not present

## 2022-01-14 LAB — CULTURE, BLOOD (ROUTINE X 2)
Culture: NO GROWTH
Culture: NO GROWTH
Special Requests: ADEQUATE
Special Requests: ADEQUATE

## 2022-01-14 LAB — GLUCOSE, CAPILLARY
Glucose-Capillary: 124 mg/dL — ABNORMAL HIGH (ref 70–99)
Glucose-Capillary: 188 mg/dL — ABNORMAL HIGH (ref 70–99)
Glucose-Capillary: 133 mg/dL — ABNORMAL HIGH (ref 70–99)
Glucose-Capillary: 139 mg/dL — ABNORMAL HIGH (ref 70–99)

## 2022-01-14 MED ORDER — NIMODIPINE 30 MG PO CAPS
60.0000 mg | ORAL_CAPSULE | ORAL | Status: DC
  Administered 2022-01-14 – 2022-01-21 (×39): 60 mg via ORAL
  Filled 2022-01-14 (×39): qty 2

## 2022-01-14 MED ORDER — ACETAMINOPHEN 500 MG PO TABS
500.0000 mg | ORAL_TABLET | Freq: Four times a day (QID) | ORAL | Status: DC
  Administered 2022-01-14 – 2022-01-15 (×6): 500 mg via ORAL
  Filled 2022-01-14 (×6): qty 1

## 2022-01-14 MED ORDER — ENOXAPARIN SODIUM 40 MG/0.4ML IJ SOSY
40.0000 mg | PREFILLED_SYRINGE | INTRAMUSCULAR | Status: DC
  Administered 2022-01-14 – 2022-01-21 (×8): 40 mg via SUBCUTANEOUS
  Filled 2022-01-14 (×8): qty 0.4

## 2022-01-14 MED ORDER — ACETAMINOPHEN 500 MG PO TABS
500.0000 mg | ORAL_TABLET | Freq: Four times a day (QID) | ORAL | Status: DC
  Filled 2022-01-14: qty 1

## 2022-01-14 MED ORDER — KETOROLAC TROMETHAMINE 30 MG/ML IJ SOLN
30.0000 mg | Freq: Once | INTRAMUSCULAR | Status: AC
Start: 2022-01-14 — End: 2022-01-14
  Administered 2022-01-14: 30 mg via INTRAVENOUS
  Filled 2022-01-14: qty 1

## 2022-01-14 MED ORDER — DOCUSATE SODIUM 100 MG PO CAPS
100.0000 mg | ORAL_CAPSULE | Freq: Two times a day (BID) | ORAL | Status: DC
  Filled 2022-01-14: qty 1

## 2022-01-14 MED ORDER — GABAPENTIN 250 MG/5ML PO SOLN
200.0000 mg | Freq: Three times a day (TID) | ORAL | Status: DC
  Administered 2022-01-14 – 2022-01-15 (×3): 200 mg via ORAL
  Filled 2022-01-14 (×5): qty 4

## 2022-01-14 MED ORDER — POLYETHYLENE GLYCOL 3350 17 G PO PACK
17.0000 g | PACK | Freq: Two times a day (BID) | ORAL | Status: DC
Start: 2022-01-14 — End: 2022-01-18

## 2022-01-14 MED ORDER — BETHANECHOL CHLORIDE 25 MG PO TABS
25.0000 mg | ORAL_TABLET | Freq: Three times a day (TID) | ORAL | Status: AC
  Administered 2022-01-14 (×2): 25 mg via ORAL
  Filled 2022-01-14 (×2): qty 1

## 2022-01-14 MED ORDER — SENNA 8.6 MG PO TABS
2.0000 | ORAL_TABLET | Freq: Every day | ORAL | Status: DC
  Filled 2022-01-14: qty 2

## 2022-01-14 MED ORDER — SODIUM CHLORIDE 0.9 % IV SOLN: INTRAVENOUS | Status: DC

## 2022-01-14 MED ORDER — NIMODIPINE 6 MG/ML PO SOLN
60.0000 mg | ORAL | Status: DC
  Administered 2022-01-14 – 2022-01-18 (×4): 60 mg via ORAL
  Filled 2022-01-14 (×11): qty 10

## 2022-01-14 MED ORDER — TRAMADOL HCL 50 MG PO TABS
50.0000 mg | ORAL_TABLET | Freq: Four times a day (QID) | ORAL | Status: DC | PRN
  Administered 2022-01-14 – 2022-01-20 (×13): 50 mg via ORAL
  Filled 2022-01-14 (×15): qty 1

## 2022-01-14 MED ORDER — FOLIC ACID 1 MG PO TABS
1.0000 mg | ORAL_TABLET | Freq: Every day | ORAL | Status: DC
  Administered 2022-01-15 – 2022-01-21 (×7): 1 mg via ORAL
  Filled 2022-01-14 (×7): qty 1

## 2022-01-14 MED ORDER — ADULT MULTIVITAMIN W/MINERALS CH
1.0000 | ORAL_TABLET | Freq: Every day | ORAL | Status: DC
  Administered 2022-01-15 – 2022-01-21 (×7): 1 via ORAL
  Filled 2022-01-14 (×7): qty 1

## 2022-01-14 MED ORDER — HYDROMORPHONE HCL 1 MG/ML IJ SOLN
1.0000 mg | INTRAMUSCULAR | Status: DC | PRN
  Administered 2022-01-14 – 2022-01-15 (×4): 1 mg via INTRAVENOUS
  Filled 2022-01-14 (×4): qty 1

## 2022-01-14 MED ORDER — BUTALBITAL-APAP-CAFFEINE 50-325-40 MG PO TABS: 1.0000 | ORAL_TABLET | ORAL | Status: DC | PRN

## 2022-01-14 MED ORDER — HYDROMORPHONE HCL 1 MG/ML IJ SOLN
1.0000 mg | Freq: Once | INTRAMUSCULAR | Status: AC
  Administered 2022-01-14: 1 mg via INTRAVENOUS
  Filled 2022-01-14: qty 1

## 2022-01-14 MED ORDER — LEVETIRACETAM 500 MG PO TABS
500.0000 mg | ORAL_TABLET | Freq: Two times a day (BID) | ORAL | Status: DC
  Administered 2022-01-14 – 2022-01-21 (×14): 500 mg via ORAL
  Filled 2022-01-14 (×14): qty 1

## 2022-01-14 MED ORDER — TRAMADOL HCL 50 MG PO TABS: 50.0000 mg | ORAL_TABLET | Freq: Four times a day (QID) | ORAL | Status: DC | PRN

## 2022-01-14 MED ORDER — THIAMINE HCL 100 MG PO TABS
100.0000 mg | ORAL_TABLET | Freq: Every day | ORAL | Status: DC
  Administered 2022-01-15 – 2022-01-16 (×2): 100 mg via ORAL
  Filled 2022-01-14 (×2): qty 1

## 2022-01-14 MED FILL — Fentanyl Citrate Soln Prefilled Syringe 100 MCG/2ML: INTRAMUSCULAR | Qty: 2 | Status: AC

## 2022-01-14 NOTE — Progress Notes (Signed)
Speech Language Pathology Treatment: Cognitive-Linquistic  Patient Details Name: Judy Walsh MRN: [id number] DOB: 1975/08/30 Today's Date: 01/14/2022 Time: 4650-3546 SLP Time Calculation (min) (ACUTE ONLY): 11 min  Assessment / Plan / Recommendation Clinical Impression  Ms. Judy Walsh is much improved today. She was sitting in recliner; her father present.  She was much more alert and interactive.  Demonstrated excellent airway protection with thin liquids with no concerns for aspiration. RN reports good PO intake today. She was oriented to person, place, but not elements of time nor timeline.  She demonstrated sustained attention to conversation; improved storage and retrieval of verbal information over two then five minute intervals. She was appropriately emotional about the last week and verbalized her gratitude to staff. SLP will continue to follow for swallowing/cognition with recs for AIR when appropriate.  HPI HPI: Pt is a 46 year old female who presented to the ED with sudden onset of severe headache as well as nausea and vomiting.  CT head 6/12: Moderate volume of acute subarachnoid hemorrhage along the falx supresellar cister, sylvian fissures and basal cistern. Repeat CT head 6/12 revealed  worsening moderate-volume subarachnoid hemorrhage with increased cerebral edema and sulcal crowding. 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 Decreased responsiveness noted in the ED and pt intubated. ETT 6/12-6/14. EVS removed 6/20.      SLP Plan  Continue with current plan of care      Recommendations for follow up therapy are one component of a multi-disciplinary discharge planning process, led by the attending physician.  Recommendations may be updated based on patient status, additional functional criteria and insurance authorization.    Recommendations  Diet recommendations: Regular;Thin  liquid Liquids provided via: Cup;Straw Medication Administration: Whole meds with puree Supervision: Patient able to self feed Compensations: Slow rate;Small sips/bites Postural Changes and/or Swallow Maneuvers: Seated upright 90 degrees                Oral Care Recommendations: Oral care BID Follow Up Recommendations: Acute inpatient rehab (3hours/day) Assistance recommended at discharge: Frequent or constant Supervision/Assistance SLP Visit Diagnosis: Dysphagia, unspecified (R13.10);Cognitive communication deficit (R41.841) Plan: Continue with current plan of care         Melannie Metzner L. Samson Frederic, MA CCC/SLP Clinical Specialist - Acute Care SLP Acute Rehabilitation Services Office number [phone]   Blenda Mounts Laurice  01/14/2022, 3:39 PM

## 2022-01-14 NOTE — Progress Notes (Signed)
NAME:  Judy Walsh, MRN:  [id number], DOB:  [birth year], LOS: 8 ADMISSION DATE:  01/06/2022, CONSULTATION DATE:  01/07/22 REFERRING MD:  Cram/Meyran, CHIEF COMPLAINT:  headache, n/v  History of Present Illness:  Severe sudden onset headache 6/12. CT head with moderate acute SAH along the falx supresellar cister, sylvian fissures and basal cistern. Neurosurgery consulted. EVD placed at bedside. Throughout ED stay with decreased responsiveness requiring intubation. PCCM consulted   Pertinent  Medical History  Nodular opacities in RUL and L lung.  Myopericarditis hx  Meds: colchicine, ibuprofen, pepcid  Significant Hospital Events: Including procedures, antibiotic start and stop dates in addition to other pertinent events   6/12 presented with significant severe headache on admission found to have subarachnoid hemorrhage with eventual development of cerebral edema requiring placement of EVD.  PCCM consulted for respiratory compromise and intubation  6/13 patient has been stable post EVD placement, tolerating ventilator. Underwent coiling of aneurysm   6/14 Currently tolerating vent wean, extubated 6/14 6/18 arousable, interactive, required pain medications 6/20 CT head shows no change with clamping.  Interim History / Subjective:  Continues to have severe headache, requiring hydromorphone.   Objective   Blood pressure (!) 184/94, pulse 76, temperature (!) 101 F (38.3 C), temperature source Axillary, resp. rate 14, height 5\' 8"  (1.727 m), weight 74.2 kg, SpO2 98 %.        Intake/Output Summary (Last 24 hours) at 01/14/2022 0921 Last data filed at 01/14/2022 0800 Gross per 24 hour  Intake 1350 ml  Output 2050 ml  Net -700 ml    Filed Weights   01/12/22 0423 01/13/22 0430 01/14/22 0339  Weight: 74.3 kg 74.2 kg 74.2 kg    Examination: General: Does not appear to be in distress, but in restrains  HEENT: EVD in place - clamped Neuro: She does follow commands, no focal deficits.  Oriented to place and time. + photophobia.  CV: S1-S2 appreciated 2/5 SEM PULM: Clear breath sounds GI: Small bore feeding tube in place. Abdomen soft.  Extremities: warm/dry, no edema  Skin: no rashes or lesions  Ancillary tests personally reviewed:   TCD increase LR in left MCA  Assessment & Plan:   SAH (subarachnoid hemorrhage) (HCC) H&H 2, Fischer 1 Post ictus day 8 Post coiling day 7 of ACOM and left PCOM aneurysms.  Remains lethargic. Still severe headache TCD 6/19 showed moderate vasospasm L MCA with LR 4.1 Non-focal examination.   - Start IV fluids and consider augmentation with phenylephrine if remains somnolent by mid-morning.  - TCD tomorrow   Hydrocephalus (HCC) EVD remains clamped.  Minimal blood-tinged CSF CT shows no significant change in ventricular size.   - Consider drain removal today, although patient continues to have headache out of proportion to degree of hemorrhage.   Acute delirium Multifactorial due to location of hemorrhage and sleep deprivation.   - Decrease frequency of examinations - Consider hemodynamic augmentation to see if exam improves, but unlikely vasospasm as examination has remained unchanged since admission. - Continue restrains to prevent self-harm during periods of agitation.   Fever Fever curve improving. No leukocytosis.  - Likely central fever.  - Monitor for now.   Cephalgia Refractory headache, frontal. Appears out of proportion with amount of blood present.  No pain on palpation.   - ATC APAP, trial of ketorolac - Fioricet for breakthrough.  - Limit narcotic use as may cause rebound headache.    Best Practice (right click and "Reselect all SmartList Selections" daily)   Diet/type:  tubefeeds DVT prophylaxis: SCD GI prophylaxis: PPI Code Status:  full code Last date of multidisciplinary goals of care discussion: Family updated at bedside am of 6/18  CRITICAL CARE Performed by: Lynnell Catalan   Total  critical care time: 40 minutes  Critical care time was exclusive of separately billable procedures and treating other patients.  Critical care was necessary to treat or prevent imminent or life-threatening deterioration.  Critical care was time spent personally by me on the following activities: development of treatment plan with patient and/or surrogate as well as nursing, discussions with consultants, evaluation of patient's response to treatment, examination of patient, obtaining history from patient or surrogate, ordering and performing treatments and interventions, ordering and review of laboratory studies, ordering and review of radiographic studies, pulse oximetry, re-evaluation of patient's condition and participation in multidisciplinary rounds.  Lynnell Catalan, MD [facility] ICU Physician [facility] [place] Critical Care  Pager: [phone] Mobile: [phone] After hours: [phone].

## 2022-01-14 NOTE — Progress Notes (Signed)
Physical Therapy Treatment Patient Details Name: Judy Walsh MRN: [id number] DOB: Jun 17, 1976 Today's Date: 01/14/2022   History of Present Illness Judy Walsh is a 46 yo female who presented 6/12 with severe headache with nausea and vomiting, found to have subarachnoid hemorrhage with eventual development of cerebral edema requiring emergent placement of EVD. 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 PMHx: myopericarditis.    PT Comments    Patient progressing with ambulation in the room around bed to chair with RW and assist for balance, walker management and direction.  She needs frequent multimodal for attention to task and for safety.  Family in the room and supportive.  Continue to feel she will benefit from acute inpatient rehab prior to d/c home.  PT will continue to follow acutely.    Recommendations for follow up therapy are one component of a multi-disciplinary discharge planning process, led by the attending physician.  Recommendations may be updated based on patient status, additional functional criteria and insurance authorization.  Follow Up Recommendations  Acute inpatient rehab (3hours/day)     Assistance Recommended at Discharge Frequent or constant Supervision/Assistance  Patient can return home with the following Assistance with cooking/housework;Assistance with feeding;Direct supervision/assist for financial management;Direct supervision/assist for medications management;Assist for transportation;Help with stairs or ramp for entrance;A little help with walking and/or transfers;A lot of help with bathing/dressing/bathroom   Equipment Recommendations  Other (comment) (TBA)    Recommendations for Other Services       Precautions / Restrictions       Mobility  Bed Mobility Overal bed mobility: Needs Assistance Bed Mobility: Supine to Sit     Supine to sit: HOB elevated, Min  guard     General bed mobility comments: increased time, multiple cues for initiation, follow through to get feet on the floor, leaning L and posterior at times cuse and minguard for support to rebalance    Transfers Overall transfer level: Needs assistance Equipment used: Rolling walker (2 wheels) Transfers: Sit to/from Stand Sit to Stand: Min assist           General transfer comment: assist for balance upon standing with RW from EOB, daughter assisted with IV    Ambulation/Gait Ambulation/Gait assistance: Mod assist, +2 safety/equipment Gait Distance (Feet): 12 Feet Assistive device: Rolling walker (2 wheels) Gait Pattern/deviations: Step-to pattern, Decreased stride length, Knees buckling, Ataxic       General Gait Details: walked around bed to chair in room with L knee buckling at times, cues and assist for walker management and direction with cues to keep eyes open and for direction/goal; daughter assisted to push IV pole as pt still with EVD clamped and likely for removal today   Stairs             Wheelchair Mobility    Modified Rankin (Stroke Patients Only) Modified Rankin (Stroke Patients Only) Pre-Morbid Rankin Score: No symptoms Modified Rankin: Moderately severe disability     Balance Overall balance assessment: Needs assistance Sitting-balance support: Feet supported Sitting balance-Leahy Scale: Poor Sitting balance - Comments: leaning back and to R seated EOB Postural control: Posterior lean, Right lateral lean Standing balance support: Bilateral upper extremity supported Standing balance-Leahy Scale: Poor Standing balance comment: min A for balance, keeping walker on the ground                            Cognition Arousal/Alertness: Lethargic Behavior During  Therapy: Flat affect Overall Cognitive Status: Impaired/Different from baseline                     Current Attention Level: Sustained   Following Commands:  Follows one step commands with increased time, Follows one step commands inconsistently Safety/Judgement: Decreased awareness of safety, Decreased awareness of deficits   Problem Solving: Slow processing, Requires verbal cues, Difficulty sequencing          Exercises      General Comments General comments (skin integrity, edema, etc.): daughter and father present, with daughter assisting pt to eat a sandwich.  Patient in chair no restraints, RN aware and father in the room asked to call RN if pt restless      Pertinent Vitals/Pain Pain Assessment Faces Pain Scale: Hurts little more Pain Location: generalized with mobility Pain Descriptors / Indicators: Grimacing Pain Intervention(s): Monitored during session, Repositioned    Home Living                          Prior Function            PT Goals (current goals can now be found in the care plan section) Progress towards PT goals: Progressing toward goals    Frequency    Min 4X/week      PT Plan Current plan remains appropriate    Co-evaluation              AM-PAC PT "6 Clicks" Mobility   Outcome Measure  Help needed turning from your back to your side while in a flat bed without using bedrails?: A Lot Help needed moving from lying on your back to sitting on the side of a flat bed without using bedrails?: A Lot Help needed moving to and from a bed to a chair (including a wheelchair)?: A Lot Help needed standing up from a chair using your arms (e.g., wheelchair or bedside chair)?: A Little Help needed to walk in hospital room?: Total Help needed climbing 3-5 steps with a railing? : Total 6 Click Score: 11    End of Session Equipment Utilized During Treatment: Gait belt Activity Tolerance: Patient limited by lethargy Patient left: in chair;with call bell/phone within reach;with family/visitor present Nurse Communication: Mobility status PT Visit Diagnosis: Other abnormalities of gait and  mobility (R26.89);Muscle weakness (generalized) (M62.81);Hemiplegia and hemiparesis Hemiplegia - Right/Left: Right Hemiplegia - dominant/non-dominant: Dominant Hemiplegia - caused by: Nontraumatic SAH     Time: 1210-1238 PT Time Calculation (min) (ACUTE ONLY): 28 min  Charges:  $Gait Training: 8-22 mins $Therapeutic Activity: 8-22 mins                     Sheran Lawless, PT Acute Rehabilitation Services Pager:[phone] [phone] 01/14/2022    Elray Mcgregor 01/14/2022, 1:02 PM

## 2022-01-14 NOTE — Progress Notes (Addendum)
  NEUROSURGERY PROGRESS NOTE   Pt seen and examined. Had significant HA overnight. RN reports waxing/waning episodes of somnolence   EXAM: Temp:  [98 F (36.7 C)-101 F (38.3 C)] 98.3 F (36.8 C) (06/20 1136) Pulse Rate:  [56-232] 72 (06/20 1030) Resp:  [13-19] 14 (06/20 0800) BP: (95-184)/(58-103) 132/83 (06/20 1030) SpO2:  [86 %-99 %] 94 % (06/20 1030) Weight:  [74.2 kg] 74.2 kg (06/20 0339) Intake/Output      06/19 0701 06/20 0700 06/20 0701 06/21 0700   P.O. 200    I.V. (mL/kg) 12.2 (0.2) 53.2 (0.7)   NG/GT 1250 150   Total Intake(mL/kg) 1462.2 (19.7) 203.2 (2.7)   Urine (mL/kg/hr) 2050 (1.2) 150 (0.4)   Drains 0    Stool 0    Total Output 2050 150   Net -587.8 +53.2        Stool Occurrence 3 x     Awake, alert,  Answers questions appropriately CN grossly intact Follows commands briskly x4 good strength, minimal pronator drift EVD in place, clamped   LABS: Lab Results  Component Value Date   CREATININE 0.54 01/13/2022   BUN 19 01/13/2022   NA 133 (L) 01/13/2022   K 4.3 01/13/2022   CL 100 01/13/2022   CO2 20 (L) 01/13/2022   Lab Results  Component Value Date   WBC 9.8 01/13/2022   HGB 14.2 01/13/2022   HCT 40.6 01/13/2022   MCV 85.7 01/13/2022   PLT 248 01/13/2022    IMAGING: CTH this am reviewed, no HCP.  IMPRESSION: - 46 y.o. female SAH d# 8 s/p coil embo Acom/Left Pcom aneurysms. 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  - d/c EVD today - Plan on EEG today - Cont steroids - Cont Nimotop - Cont PT/OT   Plan d/w family at bedside. All questions answered.   Lisbeth Renshaw, MD [facility]

## 2022-01-14 NOTE — Assessment & Plan Note (Addendum)
Refractory headache, frontal. Appeared out of proportion with amount of blood present.  No pain on palpation.  Much better following ketorolac and addition of NSAIDs  - ATC APAP, prn ibuprofen. - Fioricet for breakthrough.  - Limit narcotic use as may cause rebound headache.  - Headache control appears to be as good as realistically possible

## 2022-01-15 ENCOUNTER — Inpatient Hospital Stay (HOSPITAL_COMMUNITY): Payer: 59

## 2022-01-15 DIAGNOSIS — R519 Headache, unspecified: Secondary | ICD-10-CM | POA: Diagnosis not present

## 2022-01-15 DIAGNOSIS — I609 Nontraumatic subarachnoid hemorrhage, unspecified: Secondary | ICD-10-CM

## 2022-01-15 LAB — GLUCOSE, CAPILLARY
Glucose-Capillary: 112 mg/dL — ABNORMAL HIGH (ref 70–99)
Glucose-Capillary: 117 mg/dL — ABNORMAL HIGH (ref 70–99)
Glucose-Capillary: 124 mg/dL — ABNORMAL HIGH (ref 70–99)
Glucose-Capillary: 124 mg/dL — ABNORMAL HIGH (ref 70–99)
Glucose-Capillary: 145 mg/dL — ABNORMAL HIGH (ref 70–99)
Glucose-Capillary: 148 mg/dL — ABNORMAL HIGH (ref 70–99)

## 2022-01-15 MED ORDER — GABAPENTIN 100 MG PO CAPS
200.0000 mg | ORAL_CAPSULE | Freq: Three times a day (TID) | ORAL | Status: DC
  Administered 2022-01-15 – 2022-01-21 (×19): 200 mg via ORAL
  Filled 2022-01-15 (×19): qty 2

## 2022-01-15 MED ORDER — IBUPROFEN 200 MG PO TABS
400.0000 mg | ORAL_TABLET | Freq: Four times a day (QID) | ORAL | Status: DC | PRN
  Administered 2022-01-15 – 2022-01-21 (×22): 400 mg via ORAL
  Filled 2022-01-15 (×22): qty 2

## 2022-01-15 MED ORDER — ORAL CARE MOUTH RINSE: 15.0000 mL | OROMUCOSAL | Status: DC | PRN

## 2022-01-15 MED ORDER — ORAL CARE MOUTH RINSE: 15.0000 mL | OROMUCOSAL | Status: DC

## 2022-01-15 MED ORDER — ENSURE ENLIVE PO LIQD
237.0000 mL | Freq: Three times a day (TID) | ORAL | Status: DC
  Administered 2022-01-15 – 2022-01-20 (×9): 237 mL via ORAL

## 2022-01-15 NOTE — Progress Notes (Signed)
  NEUROSURGERY PROGRESS NOTE   Pt seen and examined. No issues overnight. HA improved after tramadol this am.   EXAM: Temp:  [97.9 F (36.6 C)-101 F (38.3 C)] 101 F (38.3 C) (06/21 0800) Pulse Rate:  [61-232] 75 (06/21 0814) Resp:  [13-25] 16 (06/21 0814) BP: (115-162)/(52-98) 155/86 (06/21 0814) SpO2:  [86 %-100 %] 95 % (06/21 0814) Intake/Output      06/20 0701 06/21 0700 06/21 0701 06/22 0700   P.O.     I.V. (mL/kg) 1941.7 (26.2) 197.1 (2.7)   NG/GT 600 100   Total Intake(mL/kg) 2541.7 (34.3) 297.1 (4)   Urine (mL/kg/hr) 1700 (1) 0 (0)   Drains     Stool     Total Output 1700 0   Net +841.7 +297.1         Awake, alert,  Answers questions appropriately CN grossly intact Follows commands briskly x4 good strength, symmetric EVD in place, clamped   LABS: Lab Results  Component Value Date   CREATININE 0.54 01/13/2022   BUN 19 01/13/2022   NA 133 (L) 01/13/2022   K 4.3 01/13/2022   CL 100 01/13/2022   CO2 20 (L) 01/13/2022   Lab Results  Component Value Date   WBC 9.8 01/13/2022   HGB 14.2 01/13/2022   HCT 40.6 01/13/2022   MCV 85.7 01/13/2022   PLT 248 01/13/2022     IMPRESSION: - 46 y.o. female SAH d# 9 s/p coil embo Acom/Left Pcom aneurysms. Appears neurologically stable - Communicating hydrocephalus resolved  PLAN: - Cont IVF - Cont steroids - Cont Nimotop - Cont PT/OT   Plan d/w family at bedside. All questions answered.   Lisbeth Renshaw, MD [facility]

## 2022-01-15 NOTE — Progress Notes (Signed)
Physical Therapy Treatment Patient Details Name: Judy Walsh MRN: [id number] DOB: October 18, 1975 Today's Date: 01/15/2022   History of Present Illness Judy Walsh is a 46 yo female who presented 6/12 with severe headache with nausea and vomiting, found to have subarachnoid hemorrhage with eventual development of cerebral edema requiring emergent placement of EVD. 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 PMHx: myopericarditis.    PT Comments    Pt impulsively trying to exit her bed upon arrival, needing reminders of her deficits and safety concerns even though pt is oriented to the situation and location. Pt continues to display deficits in balance, leg strength, and coordination that also impact her safety with mobility, needing minA for transfers and to ambulate up to ~20 ft (mod cues) with a RW today. Will continue to follow acutely. Current recommendations remain appropriate.     Recommendations for follow up therapy are one component of a multi-disciplinary discharge planning process, led by the attending physician.  Recommendations may be updated based on patient status, additional functional criteria and insurance authorization.  Follow Up Recommendations  Acute inpatient rehab (3hours/day)     Assistance Recommended at Discharge Frequent or constant Supervision/Assistance  Patient can return home with the following Assistance with cooking/housework;Assistance with feeding;Direct supervision/assist for financial management;Direct supervision/assist for medications management;Assist for transportation;Help with stairs or ramp for entrance;A little help with walking and/or transfers;A lot of help with bathing/dressing/bathroom   Equipment Recommendations  Rolling walker (2 wheels);BSC/3in1    Recommendations for Other Services       Precautions / Restrictions Precautions Precautions: Fall Precaution  Comments: cortrak; SBP goal 130-150 Restrictions Weight Bearing Restrictions: No     Mobility  Bed Mobility               General bed mobility comments: Pt sitting up EOB upon arrival.    Transfers Overall transfer level: Needs assistance Equipment used: Rolling walker (2 wheels) Transfers: Sit to/from Stand Sit to Stand: Min assist           General transfer comment: MinA to come to stand 1x from EOB and 1x from commode.    Ambulation/Gait Ambulation/Gait assistance: Min assist Gait Distance (Feet): 20 Feet (x2 bouts of ~20 ft > ~17 ft) Assistive device: Rolling walker (2 wheels) Gait Pattern/deviations: Step-to pattern, Decreased stride length, Knees buckling, Ataxic Gait velocity: reduced Gait velocity interpretation: <1.31 ft/sec, indicative of household ambulator   General Gait Details: Pt with slow, unsteady gait with intermittent L knee buckling, needing minA for stability and to cue pt on direction to ambulate within the room. mod cues for directing pt   Stairs             Wheelchair Mobility    Modified Rankin (Stroke Patients Only) Modified Rankin (Stroke Patients Only) Pre-Morbid Rankin Score: No symptoms Modified Rankin: Moderately severe disability     Balance Overall balance assessment: Needs assistance Sitting-balance support: Feet supported, Single extremity supported Sitting balance-Leahy Scale: Poor Sitting balance - Comments: UE support sitting EOB, min guard   Standing balance support: Bilateral upper extremity supported, During functional activity Standing balance-Leahy Scale: Poor Standing balance comment: Reliant on UE support or up to minA for stability                            Cognition Arousal/Alertness: Awake/alert Behavior During Therapy: Flat affect, Impulsive Overall Cognitive Status: Impaired/Different from baseline Area of  Impairment: Attention, Following commands, Safety/judgement, Awareness,  Problem solving                   Current Attention Level: Selective   Following Commands: Follows one step commands consistently, Follows one step commands with increased time Safety/Judgement: Decreased awareness of safety, Decreased awareness of deficits Awareness: Emergent Problem Solving: Slow processing, Difficulty sequencing, Requires verbal cues General Comments: Upon arrival, pt was impulsively trying to exit the bed to go to the bathroom, forgetting she needed to call for assistance and not get up alone. Pt oriented to location and situation though. Needs multi-modal simple cues to attend to tasks and sequence mobility.        Exercises      General Comments        Pertinent Vitals/Pain Pain Assessment Pain Assessment: Faces Faces Pain Scale: Hurts a little bit Pain Location: generalized with mobility Pain Descriptors / Indicators: Grimacing Pain Intervention(s): Limited activity within patient's tolerance, Monitored during session, Repositioned    Home Living                          Prior Function            PT Goals (current goals can now be found in the care plan section) Acute Rehab PT Goals Patient Stated Goal: to go to bathroom PT Goal Formulation: With patient/family Time For Goal Achievement: 01/22/22 Potential to Achieve Goals: Good Progress towards PT goals: Progressing toward goals    Frequency    Min 4X/week      PT Plan Equipment recommendations need to be updated    Co-evaluation              AM-PAC PT "6 Clicks" Mobility   Outcome Measure  Help needed turning from your back to your side while in a flat bed without using bedrails?: A Little Help needed moving from lying on your back to sitting on the side of a flat bed without using bedrails?: A Little Help needed moving to and from a bed to a chair (including a wheelchair)?: A Little Help needed standing up from a chair using your arms (e.g., wheelchair or  bedside chair)?: A Little Help needed to walk in hospital room?: A Lot (for cues) Help needed climbing 3-5 steps with a railing? : Total 6 Click Score: 15    End of Session Equipment Utilized During Treatment: Gait belt Activity Tolerance: Patient tolerated treatment well Patient left: in chair;with call bell/phone within reach;with chair alarm set;with family/visitor present Nurse Communication: Mobility status PT Visit Diagnosis: Other abnormalities of gait and mobility (R26.89);Muscle weakness (generalized) (M62.81);Hemiplegia and hemiparesis;Unsteadiness on feet (R26.81);Difficulty in walking, not elsewhere classified (R26.2);Other symptoms and signs involving the nervous system (R29.898) Hemiplegia - Right/Left: Right Hemiplegia - dominant/non-dominant: Dominant Hemiplegia - caused by: Nontraumatic SAH     Time: 1115-1130 PT Time Calculation (min) (ACUTE ONLY): 15 min  Charges:  $Gait Training: 8-22 mins                     Raymond Gurney, PT, DPT Acute Rehabilitation Services  Office: [phone]    Judy Walsh 01/15/2022, 3:44 PM

## 2022-01-15 NOTE — Plan of Care (Signed)
  Problem: Education: Goal: Knowledge of General Education information will improve Description: Including pain rating scale, medication(s)/side effects and non-pharmacologic comfort measures Outcome: Progressing   Problem: Activity: Goal: Risk for activity intolerance will decrease Outcome: Progressing   Problem: Nutrition: Goal: Adequate nutrition will be maintained Outcome: Progressing   

## 2022-01-15 NOTE — Progress Notes (Signed)
Nutrition Follow-up  DOCUMENTATION CODES:   Not applicable  INTERVENTION:   - Ensure Enlive po TID, each supplement provides 350 kcal and 20 grams of protein  - Continue MVI with minerals daily  - Encourage PO intake  NUTRITION DIAGNOSIS:   Inadequate oral intake related to inability to eat as evidenced by NPO status.  Progressing, pt now on regular diet with thin liquids  GOAL:   Patient will meet greater than or equal to 90% of their needs  Progressing  MONITOR:   PO intake, Supplement acceptance, Weight trends  REASON FOR ASSESSMENT:   Consult Enteral/tube feeding initiation and management  ASSESSMENT:   Pt with PMH of nodular opacities in RUL and L lung, myopericarditis, previous smoker, and daily ETOH 2 glasses of wine per day admitted with acute encephalopathy due to new SAH and associated cerebral edema.  06/12 - EVD placement 06/13 - s/p coiling of aneurysm  06/14 - s/p extubation, Cortrak placement (tip in distal stomach) 06/16 - diet advanced to regular with nectar-thick liquids 06/19 - diet advanced to regular with thin liquids 06/20 - EVD removed  Discussed pt with RN. MD d/c tube feeding orders this morning. Per RN, pt eating really well and has made significant progress in terms of PO intake. Plan is to leave Cortrak in for 1 more day and remove tomorrow if PO intake continues to be adequate.  Spoke with pt and family member at bedside. Noted pt had completed ~90% of breakfast meal (~430 kcal and ~16 grams of protein). Pt's family reports already ordering lunch to arrive at 1300. Family requested RD assistance in ordering dinner meal. Pt willing to consume a sandwich and hummus plate with pita and vegetables.  Pt also willing to try Ensure supplements. RD to order. Will continue with MVI with minerals daily.  Meal Completion: 15-75%  Medications reviewed and include: colace, folic acid, MVI with minerals daily, nimodipine, miralax, senna,  thiamine IVF: NS @ 100 ml/hr  Labs reviewed. CBG's: 124-188 x 24 hours  UOP: 1700 ml x 24 hours I/O's: -2.2 L since admit  Diet Order:   Diet Order             Diet regular Room service appropriate? Yes with Assist; Fluid consistency: Thin  Diet effective now                   EDUCATION NEEDS:   Education needs have been addressed  Skin:  Skin Assessment: Reviewed RN Assessment  Last BM:  01/14/22 type 7  Height:   Ht Readings from Last 1 Encounters:  01/06/22 5\' 8"  (1.727 m)    Weight:   Wt Readings from Last 1 Encounters:  01/14/22 74.2 kg    BMI:  Body mass index is 24.87 kg/m.  Estimated Nutritional Needs:   Kcal:  1800-2100  Protein:  95-120 grams  Fluid:  >1.9 L/day    01/16/22, MS, RD, LDN Inpatient Clinical Dietitian Please see [facility] for contact information.

## 2022-01-15 NOTE — Progress Notes (Signed)
NAME:  Judy Walsh, MRN:  [id number], DOB:  [birth year], LOS: 9 ADMISSION DATE:  01/06/2022, CONSULTATION DATE:  01/07/22 REFERRING MD:  Cram/Meyran, CHIEF COMPLAINT:  headache, n/v  History of Present Illness:  Severe sudden onset headache 6/12. CT head with moderate acute SAH along the falx supresellar cister, sylvian fissures and basal cistern. Neurosurgery consulted. EVD placed at bedside. Throughout ED stay with decreased responsiveness requiring intubation. PCCM consulted   Pertinent  Medical History  Nodular opacities in RUL and L lung.  Myopericarditis hx  Meds: colchicine, ibuprofen, pepcid  Significant Hospital Events: Including procedures, antibiotic start and stop dates in addition to other pertinent events   6/12 presented with significant severe headache on admission found to have subarachnoid hemorrhage with eventual development of cerebral edema requiring placement of EVD.  PCCM consulted for respiratory compromise and intubation  6/13 patient has been stable post EVD placement, tolerating ventilator. Underwent coiling of aneurysm   6/14 Currently tolerating vent wean, extubated 6/14 6/18 arousable, interactive, required pain medications 6/20 CT head shows no change with clamping.  Interim History / Subjective:  Headache much improved following Ketorolac yesterday. EVD removed.   Objective   Blood pressure (!) 157/76, pulse 78, temperature (!) 101 F (38.3 C), temperature source Axillary, resp. rate 20, height 5\' 8"  (1.727 m), weight 74.2 kg, SpO2 98 %.        Intake/Output Summary (Last 24 hours) at 01/15/2022 0829 Last data filed at 01/15/2022 0600 Gross per 24 hour  Intake 2491.68 ml  Output 1700 ml  Net 791.68 ml    Filed Weights   01/12/22 0423 01/13/22 0430 01/14/22 0339  Weight: 74.3 kg 74.2 kg 74.2 kg    Examination: General: Does not appear to be in distress, but in restrains  HEENT: EVD site intact with no drainage Neuro: Awake and following  commands, generalized weakness but no focal deficits.  CV: S1-S2 appreciated 2/5 SEM PULM: Clear breath sounds GI: Small bore feeding tube in place. Abdomen soft.  Extremities: warm/dry, no edema  Skin: no rashes or lesions  Ancillary tests personally reviewed:   TCD increase LR in left MCA  Assessment & Plan:   SAH (subarachnoid hemorrhage) (HCC) H&H 2, Fischer 1 Post ictus day 9 Post coiling day 8 of ACOM and left PCOM aneurysms.  Headache much improved yesterday following ketorolac  TCD 6/19 showed moderate vasospasm L MCA with LR 4.1 Non-focal examination. Much brighter!  - Start IV fluids and consider augmentation with phenylephrine if exam declines. - TCD today.   Hydrocephalus (HCC) EVD remains clamped.  Minimal blood-tinged CSF CT shows no significant change in ventricular size.   - Consider drain removal today, although patient continues to have headache out of proportion to degree of hemorrhage.   Acute delirium Multifactorial due to location of hemorrhage and sleep deprivation.  Appears to have resolved.   - q4h neuro checks.  - Consider hemodynamic augmentation to see if exam improves, but unlikely vasospasm as examination has remained unchanged since admission. - Discontinue restraints.   Cephalgia Refractory headache, frontal. Appeared out of proportion with amount of blood present.  No pain on palpation.  Much better following ketorolac  - ATC APAP, prn ibuprofen. - Fioricet for breakthrough.  - Limit narcotic use as may cause rebound headache.     Best Practice (right click and "Reselect all SmartList Selections" daily)   Diet/type: tubefeeds DVT prophylaxis: SCD GI prophylaxis: PPI Code Status:  full code Last date of multidisciplinary goals of  care discussion: Family updated at bedside am of 6/18  Lynnell Catalan, MD [facility] ICU Physician [facility] [place] [facility]  Pager: [phone] Mobile: [phone] After hours: [phone].

## 2022-01-15 NOTE — Progress Notes (Signed)
Inpatient Rehab Admissions Coordinator:  ? ?Per therapy recommendations,  patient was screened for CIR candidacy by Mirela Parsley, MS, CCC-SLP. At this time, Pt. Appears to be a a potential candidate for CIR. I will place   order for rehab consult per protocol for full assessment. Please contact me any with questions. ? ?Rudie Sermons, MS, CCC-SLP ?Rehab Admissions Coordinator  ?[phone] (celll) ?[phone] (office) ? ?

## 2022-01-15 NOTE — Progress Notes (Signed)
RN informed by vascular tech of new mild vasospasm on right side of brain. Dr. Conchita Paris notified of findings. Clinical neurologic status the same. No new orders.

## 2022-01-16 DIAGNOSIS — I609 Nontraumatic subarachnoid hemorrhage, unspecified: Secondary | ICD-10-CM | POA: Diagnosis not present

## 2022-01-16 LAB — GLUCOSE, CAPILLARY
Glucose-Capillary: 128 mg/dL — ABNORMAL HIGH (ref 70–99)
Glucose-Capillary: 126 mg/dL — ABNORMAL HIGH (ref 70–99)

## 2022-01-16 MED ORDER — METHOCARBAMOL 500 MG PO TABS
500.0000 mg | ORAL_TABLET | Freq: Four times a day (QID) | ORAL | Status: DC | PRN
  Administered 2022-01-16 – 2022-01-21 (×16): 500 mg via ORAL
  Filled 2022-01-16 (×16): qty 1

## 2022-01-16 MED ORDER — KETOROLAC TROMETHAMINE 15 MG/ML IJ SOLN
15.0000 mg | Freq: Once | INTRAMUSCULAR | Status: AC
  Administered 2022-01-16: 15 mg via INTRAVENOUS
  Filled 2022-01-16: qty 1

## 2022-01-16 MED ORDER — PHENYLEPHRINE HCL-NACL 20-0.9 MG/250ML-% IV SOLN: 25.0000 ug/min | INTRAVENOUS | Status: DC

## 2022-01-16 MED ORDER — SODIUM CHLORIDE 0.9 % IV SOLN: 250.0000 mL | INTRAVENOUS | Status: DC

## 2022-01-16 MED ORDER — ACETAMINOPHEN 500 MG PO TABS
1000.0000 mg | ORAL_TABLET | Freq: Four times a day (QID) | ORAL | Status: AC | PRN
Start: 2022-01-16 — End: 2022-01-19
  Administered 2022-01-16 – 2022-01-18 (×10): 1000 mg via ORAL
  Filled 2022-01-16 (×10): qty 2

## 2022-01-16 NOTE — Progress Notes (Signed)
Physical Therapy Treatment Patient Details Name: Judy Walsh MRN: [id number] DOB: 11-Dec-1975 Today's Date: 01/16/2022   History of Present Illness Judy Walsh is a 46 yo female who presented 6/12 with severe headache with nausea and vomiting, found to have subarachnoid hemorrhage with eventual development of cerebral edema requiring emergent placement of EVD. 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 PMHx: myopericarditis.    PT Comments    Pt is continuing to make good progress with mobility, ambulating increased distances this date and even progressing to ambulating without UE support. However, pt continues to display balance and cognition deficits that place her at risk for falls and injury, resulting in LOB bouts when using the RW and even more so (>3 LOB bouts) when ambulating without UE support. Pt needed up to modA to recover her balance when ambulating. Attempted to cue pt to widen her stance to improve her stability, but pt not following this cue due to poor comprehension. Pt did not recall ambulating with OT earlier today, indicating memory deficits. Will continue to follow acutely. Current recommendations remain appropriate.     Recommendations for follow up therapy are one component of a multi-disciplinary discharge planning process, led by the attending physician.  Recommendations may be updated based on patient status, additional functional criteria and insurance authorization.  Follow Up Recommendations  Acute inpatient rehab (3hours/day)     Assistance Recommended at Discharge Frequent or constant Supervision/Assistance  Patient can return home with the following Assistance with cooking/housework;Assistance with feeding;Direct supervision/assist for financial management;Direct supervision/assist for medications management;Assist for transportation;Help with stairs or ramp for entrance;A little  help with walking and/or transfers;A lot of help with bathing/dressing/bathroom   Equipment Recommendations  Rolling walker (2 wheels);BSC/3in1    Recommendations for Other Services       Precautions / Restrictions Precautions Precautions: Fall Precaution Comments: SBP goal 130-150 Restrictions Weight Bearing Restrictions: No     Mobility  Bed Mobility Overal bed mobility: Needs Assistance Bed Mobility: Supine to Sit     Supine to sit: Min guard, HOB elevated     General bed mobility comments: Extra time and use of bed rail with HOB elevated to sit L EOB, min guard for safety    Transfers Overall transfer level: Needs assistance Equipment used: Rolling walker (2 wheels) Transfers: Sit to/from Stand Sit to Stand: Min assist, Min guard           General transfer comment: Light minA to steady when coming to stand from EOB, pulling up on RW even though cued to push up from bed. Min guard pulling up on grab bar to stand from commode    Ambulation/Gait Ambulation/Gait assistance: Min assist, Mod assist Gait Distance (Feet): 240 Feet (x2 bouts of ~240 ft > ~20 ft) Assistive device: Rolling walker (2 wheels) Gait Pattern/deviations: Decreased stride length, Step-through pattern, Scissoring, Narrow base of support Gait velocity: reduced Gait velocity interpretation: <1.31 ft/sec, indicative of household ambulator   General Gait Details: Pt ambulating initial half with RW, needing min guard-minA for stability. During final half, progressed pt to no AD, but pt needing minA and up to modA during LOB bouts (>3) to recover balance. Pt maintained a narrow BOS with intermittent scissoring even though repeatedly provided verbal, tactile, and visual cues to correct.   Stairs             Wheelchair Mobility    Modified Rankin (Stroke Patients Only) Modified Rankin (Stroke Patients  Only) Pre-Morbid Rankin Score: No symptoms Modified Rankin: Moderately severe  disability     Balance Overall balance assessment: Needs assistance Sitting-balance support: Feet supported, No upper extremity supported Sitting balance-Leahy Scale: Fair Sitting balance - Comments: Static sitting EOB with min guard for safety   Standing balance support: Bilateral upper extremity supported, During functional activity, No upper extremity supported Standing balance-Leahy Scale: Poor Standing balance comment: When not using UE support for standing mobility, pt required up to min-modA. When using RW, pt required min guard-minA. LOB bouts noted with and without RW                            Cognition Arousal/Alertness: Awake/alert Behavior During Therapy: Impulsive Overall Cognitive Status: Impaired/Different from baseline Area of Impairment: Attention, Following commands, Safety/judgement, Awareness, Problem solving, Memory                   Current Attention Level: Selective Memory: Decreased short-term memory Following Commands: Follows one step commands consistently, Follows one step commands with increased time Safety/Judgement: Decreased awareness of safety, Decreased awareness of deficits Awareness: Emergent Problem Solving: Slow processing General Comments: Pt much less impulsive, but needs cues still at times to wait for therapist to maintain safety. Pt not recalling having worked with OT earlier in the day and walking in the halls with them. Poor safety/deficits awareness, challenging her balance by standing on one foot to joke with staff at one point. Not comprehending why PT asked her to widen her stance to improve her stability with gait, stating "ladies don't do that in the [place]".        Exercises      General Comments        Pertinent Vitals/Pain Pain Assessment Pain Assessment: No/denies pain    Home Living                          Prior Function            PT Goals (current goals can now be found in the care  plan section) Acute Rehab PT Goals Patient Stated Goal: to get OOB PT Goal Formulation: With patient/family Time For Goal Achievement: 01/22/22 Potential to Achieve Goals: Good Progress towards PT goals: Progressing toward goals    Frequency    Min 4X/week      PT Plan Current plan remains appropriate    Co-evaluation              AM-PAC PT "6 Clicks" Mobility   Outcome Measure  Help needed turning from your back to your side while in a flat bed without using bedrails?: A Little Help needed moving from lying on your back to sitting on the side of a flat bed without using bedrails?: A Little Help needed moving to and from a bed to a chair (including a wheelchair)?: A Little Help needed standing up from a chair using your arms (e.g., wheelchair or bedside chair)?: A Little Help needed to walk in hospital room?: A Lot Help needed climbing 3-5 steps with a railing? : Total 6 Click Score: 15    End of Session Equipment Utilized During Treatment: Gait belt Activity Tolerance: Patient tolerated treatment well Patient left: in chair;with call bell/phone within reach;with chair alarm set;with family/visitor present Nurse Communication: Mobility status PT Visit Diagnosis: Other abnormalities of gait and mobility (R26.89);Muscle weakness (generalized) (M62.81);Hemiplegia and hemiparesis;Unsteadiness on feet (R26.81);Difficulty in walking, not  elsewhere classified (R26.2);Other symptoms and signs involving the nervous system (R29.898) Hemiplegia - Right/Left: Right Hemiplegia - dominant/non-dominant: Dominant Hemiplegia - caused by: Nontraumatic SAH     Time: 8338-2505 PT Time Calculation (min) (ACUTE ONLY): 19 min  Charges:  $Gait Training: 8-22 mins                     Raymond Gurney, PT, DPT Acute Rehabilitation Services  Office: [phone]    Jewel Baize 01/16/2022, 5:00 PM

## 2022-01-16 NOTE — Progress Notes (Signed)
Inpatient Rehab Admissions Coordinator:   Stopped by patients room this AM, lights off and pt dosing.  Per RN monitoring for clinical signs of vasospasms today with potential for pressor support if needed.  Will continue to follow.   Estill Dooms, PT, DPT Admissions Coordinator [phone] 01/16/22  11:24 AM

## 2022-01-16 NOTE — Progress Notes (Signed)
An USGPIV (ultrasound guided PIV) has been placed for short-term vasopressor infusion. A correctly placed ivWatch must be used when administering Vasopressors. Should this treatment be needed beyond 72 hours, central line access should be obtained.  It will be the responsibility of the bedside nurse to follow best practice to prevent extravasations. IV placement is Lt Prox FA # 20, 1.88"

## 2022-01-16 NOTE — Progress Notes (Signed)
Occupational Therapy Treatment Patient Details Name: Judy Walsh MRN: [id number] DOB: 1976-02-27 Today's Date: 01/16/2022   History of present illness Judy Walsh is a 46 yo female who presented 6/12 with severe headache with nausea and vomiting, found to have subarachnoid hemorrhage with eventual development of cerebral edema requiring emergent placement of EVD. 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 PMHx: myopericarditis.   OT comments  Making excellent progress. Apparent deficits as noted below, however able to assist with ADL and ambulated @ 180 ft with Min A @ RW level & mod vc for safety. VSS throughout session. Continue ot recommend rehab at [facility] at this time. Will update/upgrade goals next session.   Recommendations for follow up therapy are one component of a multi-disciplinary discharge planning process, led by the attending physician.  Recommendations may be updated based on patient status, additional functional criteria and insurance authorization.    Follow Up Recommendations  Acute inpatient rehab (3hours/day)    Assistance Recommended at Discharge Frequent or constant Supervision/Assistance  Patient can return home with the following  Assistance with cooking/housework;Direct supervision/assist for medications management;Assist for transportation;Help with stairs or ramp for entrance;A little help with walking and/or transfers;A little help with bathing/dressing/bathroom;Direct supervision/assist for financial management   Equipment Recommendations  BSC/3in1    Recommendations for Other Services Rehab consult    Precautions / Restrictions Precautions Precautions: Fall Precaution Comments: SBP goal 130-150       Mobility Bed Mobility Overal bed mobility: Needs Assistance Bed Mobility: Supine to Sit     Supine to sit: Min assist          Transfers Overall transfer level:  Needs assistance   Transfers: Sit to/from Stand Sit to Stand: Min assist                 Balance Overall balance assessment: Needs assistance   Sitting balance-Leahy Scale: Fair       Standing balance-Leahy Scale: Poor                             ADL either performed or assessed with clinical judgement   ADL Overall ADL's : Needs assistance/impaired Eating/Feeding: Set up   Grooming: Set up;Supervision/safety   Upper Body Bathing: Minimal assistance   Lower Body Bathing: Sit to/from stand;Minimal assistance   Upper Body Dressing : Minimal assistance   Lower Body Dressing: Sit to/from stand;Moderate assistance   Toilet Transfer: Minimal assistance;Ambulation   Toileting- Clothing Manipulation and Hygiene: Minimal assistance       Functional mobility during ADLs: Minimal assistance;Rolling walker (2 wheels);Cueing for safety      Extremity/Trunk Assessment Upper Extremity Assessment Upper Extremity Assessment: Generalized weakness (using BUE functionally)            Vision   Vision Assessment?: Vision impaired- to be further tested in functional context Additional Comments: decreased visual attention; conjugate gaze   Perception     Praxis      Cognition Arousal/Alertness: Awake/alert Behavior During Therapy: Impulsive Overall Cognitive Status: Impaired/Different from baseline Area of Impairment: Attention, Problem solving, Awareness, Safety/judgement                   Current Attention Level: Selective   Following Commands: Follows one step commands consistently Safety/Judgement: Decreased awareness of safety, Decreased awareness of deficits Awareness: Emergent Problem Solving: Slow processing General Comments: cognition significantly improved since last session; decreased awareness  of safety; unaware of drifting to the side when distracted; will continue to assess        Exercises      Shoulder Instructions        General Comments Ambulated @ 180 ft    Pertinent Vitals/ Pain       Pain Assessment Pain Assessment: Faces Faces Pain Scale: Hurts little more Pain Descriptors / Indicators: Discomfort, Headache Pain Intervention(s): Limited activity within patient's tolerance  Home Living                                          Prior Functioning/Environment              Frequency  Min 2X/week        Progress Toward Goals  OT Goals(current goals can now be found in the care plan section)  Progress towards OT goals: OT to reassess next treatment;Progressing toward goals  Acute Rehab OT Goals Patient Stated Goal: to get better OT Goal Formulation: With patient Time For Goal Achievement: 01/22/22 Potential to Achieve Goals: Good ADL Goals Pt Will Perform Grooming: with set-up;sitting Pt Will Perform Upper Body Dressing: with set-up;sitting Pt Will Perform Lower Body Dressing: with min assist;sit to/from stand Pt Will Transfer to Toilet: with min assist;ambulating Additional ADL Goal #1: Pt will complete bed mobility with supervision A as a precursor to ADLs  Plan Discharge plan remains appropriate    Co-evaluation                 AM-PAC OT "6 Clicks" Daily Activity     Outcome Measure   Help from another person eating meals?: A Little Help from another person taking care of personal grooming?: A Little Help from another person toileting, which includes using toliet, bedpan, or urinal?: A Little Help from another person bathing (including washing, rinsing, drying)?: A Little Help from another person to put on and taking off regular upper body clothing?: A Little Help from another person to put on and taking off regular lower body clothing?: A Lot 6 Click Score: 17    End of Session Equipment Utilized During Treatment: Gait belt;Rolling walker (2 wheels)  OT Visit Diagnosis: Unsteadiness on feet (R26.81);Other abnormalities of gait and mobility  (R26.89);Muscle weakness (generalized) (M62.81);Pain Pain - part of body:  (headache)   Activity Tolerance Patient tolerated treatment well   Patient Left in chair;with call bell/phone within reach;with chair alarm set   Nurse Communication Mobility status        Time: 4193-7902 OT Time Calculation (min): 28 min  Charges: OT General Charges $OT Visit: 1 Visit OT Treatments $Self Care/Home Management : 8-22 mins $Therapeutic Activity: 8-22 mins  Luisa Dago, OT/L   Acute OT Clinical Specialist Acute Rehabilitation Services Pager [phone] Office [phone]   [facility] 01/16/2022, 1:07 PM

## 2022-01-16 NOTE — Progress Notes (Signed)
SLP Cancellation Note  Patient Details Name: Judy Walsh MRN: [id number] DOB: 01/19/1976   Cancelled treatment:       Reason Eval/Treat Not Completed: Patient at procedure or test/unavailable (Pt receiving care from nursing. SLP will follow up later as schedule allows.)  [name] I. Vear Clock, MS, CCC-SLP Acute Rehabilitation Services Office number [phone] Pager [phone]  [name] 01/16/2022, 12:05 PM

## 2022-01-17 ENCOUNTER — Inpatient Hospital Stay (HOSPITAL_COMMUNITY): Payer: 59

## 2022-01-17 DIAGNOSIS — I609 Nontraumatic subarachnoid hemorrhage, unspecified: Secondary | ICD-10-CM | POA: Diagnosis not present

## 2022-01-17 DIAGNOSIS — I608 Other nontraumatic subarachnoid hemorrhage: Secondary | ICD-10-CM

## 2022-01-17 LAB — CBC
HCT: 36.4 % (ref 36.0–46.0)
Hemoglobin: 12.4 g/dL (ref 12.0–15.0)
MCH: 30.1 pg (ref 26.0–34.0)
MCHC: 34.1 g/dL (ref 30.0–36.0)
MCV: 88.3 fL (ref 80.0–100.0)
Platelets: 268 10*3/uL (ref 150–400)
RBC: 4.12 MIL/uL (ref 3.87–5.11)
RDW: 11.5 % (ref 11.5–15.5)
WBC: 7 10*3/uL (ref 4.0–10.5)
nRBC: 0 % (ref 0.0–0.2)

## 2022-01-17 LAB — BASIC METABOLIC PANEL
Anion gap: 7 (ref 5–15)
BUN: 14 mg/dL (ref 6–20)
CO2: 25 mmol/L (ref 22–32)
Calcium: 8.9 mg/dL (ref 8.9–10.3)
Chloride: 106 mmol/L (ref 98–111)
Creatinine, Ser: 0.61 mg/dL (ref 0.44–1.00)
GFR, Estimated: >60 mL/min (ref >60)
Glucose, Bld: 109 mg/dL — ABNORMAL HIGH (ref 70–99)
Potassium: 3.6 mmol/L (ref 3.5–5.1)
Sodium: 138 mmol/L (ref 135–145)

## 2022-01-17 MED ORDER — MELATONIN 5 MG PO TABS
5.0000 mg | ORAL_TABLET | Freq: Every day | ORAL | Status: DC
  Administered 2022-01-17 – 2022-01-20 (×4): 5 mg via ORAL
  Filled 2022-01-17 (×4): qty 1

## 2022-01-17 MED ORDER — MELATONIN 5 MG PO TABS
5.0000 mg | ORAL_TABLET | Freq: Once | ORAL | Status: AC
Start: 2022-01-17 — End: 2022-01-17
  Administered 2022-01-17: 5 mg via ORAL
  Filled 2022-01-17: qty 1

## 2022-01-17 MED ORDER — POTASSIUM CHLORIDE 20 MEQ PO PACK
40.0000 meq | PACK | Freq: Once | ORAL | Status: AC
  Administered 2022-01-17: 40 meq
  Filled 2022-01-17: qty 2

## 2022-01-17 MED ORDER — KETOROLAC TROMETHAMINE 15 MG/ML IJ SOLN
7.5000 mg | Freq: Once | INTRAMUSCULAR | Status: AC
  Administered 2022-01-17: 7.5 mg via INTRAVENOUS
  Filled 2022-01-17: qty 1

## 2022-01-17 MED ORDER — DEXAMETHASONE SODIUM PHOSPHATE 4 MG/ML IJ SOLN
4.0000 mg | Freq: Three times a day (TID) | INTRAMUSCULAR | Status: AC
  Administered 2022-01-17 – 2022-01-18 (×3): 4 mg via INTRAVENOUS
  Filled 2022-01-17 (×3): qty 1

## 2022-01-17 NOTE — Progress Notes (Signed)
Pt complaining of headache unrelieved by u;tram, tylenol, ibuprofen. Asked neurosurgery for fioricet to try and per Dr. Maurice Small it causes rebound headaches, decadron was ordered.

## 2022-01-18 LAB — BASIC METABOLIC PANEL
Anion gap: 11 (ref 5–15)
BUN: 12 mg/dL (ref 6–20)
CO2: 23 mmol/L (ref 22–32)
Calcium: 9.4 mg/dL (ref 8.9–10.3)
Chloride: 102 mmol/L (ref 98–111)
Creatinine, Ser: 0.64 mg/dL (ref 0.44–1.00)
GFR, Estimated: >60 mL/min (ref >60)
Glucose, Bld: 181 mg/dL — ABNORMAL HIGH (ref 70–99)
Potassium: 3.7 mmol/L (ref 3.5–5.1)
Sodium: 136 mmol/L (ref 135–145)

## 2022-01-18 MED ORDER — DOCUSATE SODIUM 100 MG PO CAPS
100.0000 mg | ORAL_CAPSULE | Freq: Every day | ORAL | Status: DC | PRN
Start: 2022-01-18 — End: 2022-01-21

## 2022-01-18 MED ORDER — POLYETHYLENE GLYCOL 3350 17 G PO PACK
17.0000 g | PACK | Freq: Every day | ORAL | Status: DC
Start: 2022-01-18 — End: 2022-01-21
  Filled 2022-01-18: qty 1

## 2022-01-18 MED ORDER — SENNA 8.6 MG PO TABS
2.0000 | ORAL_TABLET | Freq: Every evening | ORAL | Status: DC | PRN
Start: 2022-01-18 — End: 2022-01-21

## 2022-01-18 NOTE — Progress Notes (Signed)
NAME:  Judy Walsh, MRN:  [id number], DOB:  08-07-1975, LOS: 12 ADMISSION DATE:  01/06/2022, CONSULTATION DATE:  01/07/22 REFERRING MD:  Cram/Meyran, CHIEF COMPLAINT:  headache, n/v  History of Present Illness:  46 yo female present with severe sudden onset headache 6/12. CT head with moderate acute SAH along the falx supresellar cister, sylvian fissures and basal cistern. Neurosurgery consulted. EVD placed at bedside. Throughout ED stay with decreased responsiveness requiring intubation. PCCM consulted   Past Medical History:  Myopericarditis November 2022  Significant Hospital Events: Including procedures, antibiotic start and stop dates in addition to other pertinent events   6/12 presented with significant severe headache on admission found to have subarachnoid hemorrhage with eventual development of cerebral edema requiring placement of EVD.  PCCM consulted for respiratory compromise and intubation  6/13 patient has been stable post EVD placement, tolerating ventilator. Underwent coiling of aneurysm   6/14 Currently tolerating vent wean, extubated 6/14 6/18 arousable, interactive, required pain medications 6/20 CT head shows no change with clamping.  6/21 increasing TCD velocities.   Interim History / Subjective:  Had terrible headache last night.  Having frequent BMs.  Relayed story about how her husband became addicted to opiates after hospitalization in 2012, but that she isn't worried about this happening to her.  Objective   Blood pressure (!) 109/92, pulse 84, temperature 98.4 F (36.9 C), temperature source Oral, resp. rate 19, height 5\' 8"  (1.727 m), weight 74.2 kg, SpO2 98 %.        Intake/Output Summary (Last 24 hours) at 01/18/2022 6962 Last data filed at 01/18/2022 0900 Gross per 24 hour  Intake 2341.93 ml  Output 500 ml  Net 1841.93 ml   Filed Weights   01/12/22 0423 01/13/22 0430 01/14/22 0339  Weight: 74.3 kg 74.2 kg 74.2 kg    Examination:  General -  alert Eyes - pupils reactive ENT - no sinus tenderness, no stridor Cardiac - regular rate/rhythm, no murmur Chest - equal breath sounds b/l, no wheezing or rales Abdomen - soft, non tender, + bowel sounds Extremities - no cyanosis, clubbing, or edema Skin - no rashes Neuro - normal strength, moves extremities, follows commands Psych - normal mood and behavior  Resolved Problems:  Hydrocephalus, Acute delirium  Assessment & Plan:   Subarachnoid hemorrhage s/p coil embolization of anterior communicating artery and Lt posterior communicating artery aneurysms. Headache. - f/u TCD - continue IV fluids to keep SBP 150 to 180; add pressors if mental status gets worse - continue nimotop - decadron, keppra, pain control regimen per neurosurgery  Constipation. - improved  - will adjust bowel regimen  Updated pt's mother at bedside.  PCCM will f/u again on 01/20/22.   Best Practice (right click and "Reselect all SmartList Selections" daily)   Diet/type:  regular diet DVT prophylaxis: Lovenox. GI prophylaxis: PPI Code Status:  full code Last date of multidisciplinary goals of care discussion: Family updated at bedside am of 6/21  Labs:      Latest Ref Rng & Units 01/17/2022    1:42 AM 01/13/2022    5:09 AM 01/12/2022    2:06 AM  CMP  Glucose 70 - 99 mg/dL 952  841  324   BUN 6 - 20 mg/dL 14  19  19    Creatinine 0.44 - 1.00 mg/dL 4.01  0.27  2.53   Sodium 135 - 145 mmol/L 138  133  132   Potassium 3.5 - 5.1 mmol/L 3.6  4.3  3.8  Chloride 98 - 111 mmol/L 106  100  102   CO2 22 - 32 mmol/L 25  20  21    Calcium 8.9 - 10.3 mg/dL 8.9  9.0  9.0        Latest Ref Rng & Units 01/17/2022    1:42 AM 01/13/2022    5:09 AM 01/12/2022    2:06 AM  CBC  WBC 4.0 - 10.5 K/uL 7.0  9.8  11.0   Hemoglobin 12.0 - 15.0 g/dL 16.1  09.6  04.5   Hematocrit 36.0 - 46.0 % 36.4  40.6  40.0   Platelets 150 - 400 K/uL 268  248  309     ABG    Component Value Date/Time   PHART 7.389 01/07/2022  0115   PCO2ART 38.5 01/07/2022 0115   PO2ART 175 (H) 01/07/2022 0115   HCO3 23.0 01/07/2022 0115   TCO2 24 01/07/2022 0115   ACIDBASEDEF 1.0 01/07/2022 0115   O2SAT 100 01/07/2022 0115    CBG (last 3)  Recent Labs    01/15/22 1931 01/16/22 0101 01/16/22 0806  GLUCAP 112* 128* 126*    Signature:  Coralyn Helling, MD [facility] Pulmonary/Critical Care Pager - [phone] 01/18/2022, 9:33 AM

## 2022-01-18 NOTE — Progress Notes (Signed)
Pt having headaches, crying and all over the bed, no relief with toradol, Dr. Warrick Parisian came on the camera in the room and patient fell asleep shortly after. Patient has not had much sleep in days.  Per Dr. Warrick Parisian if patient is asleep push meds to when nimotop is due, including ibuprofen and tylenol.

## 2022-01-19 LAB — BASIC METABOLIC PANEL
Anion gap: 5 (ref 5–15)
BUN: 16 mg/dL (ref 6–20)
CO2: 23 mmol/L (ref 22–32)
Calcium: 8.3 mg/dL — ABNORMAL LOW (ref 8.9–10.3)
Chloride: 110 mmol/L (ref 98–111)
Creatinine, Ser: 0.58 mg/dL (ref 0.44–1.00)
GFR, Estimated: >60 mL/min (ref >60)
Glucose, Bld: 90 mg/dL (ref 70–99)
Potassium: 3.5 mmol/L (ref 3.5–5.1)
Sodium: 138 mmol/L (ref 135–145)

## 2022-01-19 LAB — CBC
HCT: 31.6 % — ABNORMAL LOW (ref 36.0–46.0)
Hemoglobin: 10.5 g/dL — ABNORMAL LOW (ref 12.0–15.0)
MCH: 29.3 pg (ref 26.0–34.0)
MCHC: 33.2 g/dL (ref 30.0–36.0)
MCV: 88.3 fL (ref 80.0–100.0)
Platelets: 293 10*3/uL (ref 150–400)
RBC: 3.58 MIL/uL — ABNORMAL LOW (ref 3.87–5.11)
RDW: 11.5 % (ref 11.5–15.5)
WBC: 7.1 10*3/uL (ref 4.0–10.5)
nRBC: 0 % (ref 0.0–0.2)

## 2022-01-19 MED ORDER — POTASSIUM CHLORIDE CRYS ER 20 MEQ PO TBCR
40.0000 meq | EXTENDED_RELEASE_TABLET | Freq: Once | ORAL | Status: AC
  Administered 2022-01-19: 40 meq via ORAL
  Filled 2022-01-19: qty 2

## 2022-01-19 NOTE — Progress Notes (Signed)
Patient having frequent PVC's and asymptomatic CCM MD notified

## 2022-01-20 ENCOUNTER — Inpatient Hospital Stay (HOSPITAL_COMMUNITY): Payer: 59

## 2022-01-20 DIAGNOSIS — I608 Other nontraumatic subarachnoid hemorrhage: Secondary | ICD-10-CM

## 2022-01-20 MED ORDER — POTASSIUM CHLORIDE CRYS ER 20 MEQ PO TBCR
40.0000 meq | EXTENDED_RELEASE_TABLET | Freq: Once | ORAL | Status: AC
  Administered 2022-01-20: 40 meq via ORAL
  Filled 2022-01-20: qty 2

## 2022-01-20 NOTE — Progress Notes (Signed)
Transcranial Doppler  Date POD PCO2 HCT BP  MCA ACA PCA OPHT SIPH VERT Basilar  6/13 GC     Right  Left   28  39   *  -31   36  17   19  11   19  14    *  *   *      6/14 RH     Right  Left   82  73   *  -28   40  21   23  21    54  53   -44  -58   -71      6/16 RH     Right  Left   63  73   -34  -25   23  23   18  16    50  43   -46  -17   -42      6/19 MS  7  40.6 125/ 82 Right  Left   66  145   -56  -35   30 25 20  17    95  73   -51  -49   -49      6/21MS  9   133/82 Right  Left   129  157   -17  -36   38  44   19  14   40  29   -42  -24   -48      6/23 RH    155/70 Right  Left   93  109   -38  -35   36  24   16  17   29  25    -22  -30   *      6/26 MS    129/74 Right  Left   103  129   -70  -18   23  33   19  12   27  29    -37  -31   -33       MCA = Middle Cerebral Artery      OPHT = Opthalmic Artery     BASILAR = Basilar Artery   ACA = Anterior Cerebral Artery     SIPH = Carotid Siphon PCA = Posterior Cerebral Artery   VERT = Verterbral Artery                   Normal MCA = 62+\-12 ACA = 50+\-12 PCA = 42+\-23    * = Unable to insonate  RT [place] = 2.71 , LT [place] = 6.79  01/20/2022 11:16 AM Gertie Fey, MHA, RVT, RDCS, RDMS

## 2022-01-21 ENCOUNTER — Encounter (HOSPITAL_COMMUNITY): Payer: Self-pay | Admitting: Physical Medicine & Rehabilitation

## 2022-01-21 ENCOUNTER — Inpatient Hospital Stay (HOSPITAL_COMMUNITY)
Admission: RE | Admit: 2022-01-21 | Discharge: 2022-01-27 | DRG: 057 | Disposition: A | Discharge status: Home / Self Care | Payer: 59 | Source: Intra-hospital | Attending: Physical Medicine & Rehabilitation | Admitting: Physical Medicine & Rehabilitation

## 2022-01-21 ENCOUNTER — Other Ambulatory Visit: Payer: Self-pay

## 2022-01-21 DIAGNOSIS — K589 Irritable bowel syndrome without diarrhea: Secondary | ICD-10-CM | POA: Diagnosis present

## 2022-01-21 DIAGNOSIS — Z823 Family history of stroke: Secondary | ICD-10-CM | POA: Diagnosis not present

## 2022-01-21 DIAGNOSIS — R519 Headache, unspecified: Secondary | ICD-10-CM | POA: Diagnosis present

## 2022-01-21 DIAGNOSIS — I69098 Other sequelae following nontraumatic subarachnoid hemorrhage: Principal | ICD-10-CM

## 2022-01-21 DIAGNOSIS — G441 Vascular headache, not elsewhere classified: Secondary | ICD-10-CM | POA: Diagnosis not present

## 2022-01-21 DIAGNOSIS — Z793 Long term (current) use of hormonal contraceptives: Secondary | ICD-10-CM

## 2022-01-21 DIAGNOSIS — I609 Nontraumatic subarachnoid hemorrhage, unspecified: Secondary | ICD-10-CM

## 2022-01-21 DIAGNOSIS — I319 Disease of pericardium, unspecified: Secondary | ICD-10-CM | POA: Diagnosis present

## 2022-01-21 DIAGNOSIS — Z888 Allergy status to other drugs, medicaments and biological substances status: Secondary | ICD-10-CM | POA: Diagnosis not present

## 2022-01-21 DIAGNOSIS — M62830 Muscle spasm of back: Secondary | ICD-10-CM | POA: Diagnosis present

## 2022-01-21 DIAGNOSIS — I10 Essential (primary) hypertension: Secondary | ICD-10-CM | POA: Diagnosis present

## 2022-01-21 DIAGNOSIS — G479 Sleep disorder, unspecified: Secondary | ICD-10-CM | POA: Diagnosis not present

## 2022-01-21 MED ORDER — NIMODIPINE 30 MG PO CAPS
60.0000 mg | ORAL_CAPSULE | ORAL | Status: DC
  Administered 2022-01-21 – 2022-01-22 (×3): 60 mg via ORAL
  Filled 2022-01-21 (×5): qty 2

## 2022-01-21 MED ORDER — NIMODIPINE 30 MG PO CAPS: 60.0000 mg | ORAL_CAPSULE | ORAL | 0 refills | Status: DC

## 2022-01-21 MED ORDER — GABAPENTIN 100 MG PO CAPS
200.0000 mg | ORAL_CAPSULE | Freq: Three times a day (TID) | ORAL | Status: DC
  Administered 2022-01-21 – 2022-01-23 (×5): 200 mg via ORAL
  Filled 2022-01-21 (×5): qty 2

## 2022-01-21 MED ORDER — TOPIRAMATE 25 MG PO TABS
25.0000 mg | ORAL_TABLET | Freq: Every day | ORAL | Status: DC
  Administered 2022-01-21: 25 mg via ORAL
  Filled 2022-01-21: qty 1

## 2022-01-21 MED ORDER — NIMODIPINE 6 MG/ML PO SOLN
60.0000 mg | ORAL | Status: DC
  Filled 2022-01-21 (×5): qty 10

## 2022-01-21 MED ORDER — DOCUSATE SODIUM 100 MG PO CAPS: 100.0000 mg | ORAL_CAPSULE | Freq: Every day | ORAL | Status: DC | PRN

## 2022-01-21 MED ORDER — ONDANSETRON HCL 4 MG/2ML IJ SOLN: 4.0000 mg | Freq: Four times a day (QID) | INTRAMUSCULAR | Status: DC | PRN

## 2022-01-21 MED ORDER — SENNA 8.6 MG PO TABS
2.0000 | ORAL_TABLET | Freq: Every evening | ORAL | Status: DC | PRN
  Filled 2022-01-21: qty 2

## 2022-01-21 MED ORDER — METHOCARBAMOL 500 MG PO TABS
500.0000 mg | ORAL_TABLET | Freq: Four times a day (QID) | ORAL | Status: DC | PRN
  Administered 2022-01-21 – 2022-01-27 (×12): 500 mg via ORAL
  Filled 2022-01-21 (×12): qty 1

## 2022-01-21 MED ORDER — ONDANSETRON 4 MG PO TBDP: 4.0000 mg | ORAL_TABLET | Freq: Four times a day (QID) | ORAL | Status: DC | PRN

## 2022-01-21 MED ORDER — ADULT MULTIVITAMIN W/MINERALS CH
1.0000 | ORAL_TABLET | Freq: Every day | ORAL | Status: DC
  Administered 2022-01-22 – 2022-01-27 (×6): 1 via ORAL
  Filled 2022-01-21 (×6): qty 1

## 2022-01-21 MED ORDER — POLYETHYLENE GLYCOL 3350 17 G PO PACK
17.0000 g | PACK | Freq: Every day | ORAL | Status: DC
  Filled 2022-01-21 (×6): qty 1

## 2022-01-21 MED ORDER — MELATONIN 5 MG PO TABS
5.0000 mg | ORAL_TABLET | Freq: Every day | ORAL | Status: DC
  Administered 2022-01-21 – 2022-01-26 (×6): 5 mg via ORAL
  Filled 2022-01-21 (×6): qty 1

## 2022-01-21 MED ORDER — ENOXAPARIN SODIUM 40 MG/0.4ML IJ SOSY: 40.0000 mg | PREFILLED_SYRINGE | INTRAMUSCULAR | Status: DC

## 2022-01-21 MED ORDER — ENOXAPARIN SODIUM 40 MG/0.4ML IJ SOSY
40.0000 mg | PREFILLED_SYRINGE | INTRAMUSCULAR | Status: DC
  Administered 2022-01-22 – 2022-01-27 (×6): 40 mg via SUBCUTANEOUS
  Filled 2022-01-21 (×6): qty 0.4

## 2022-01-21 MED ORDER — ENSURE ENLIVE PO LIQD
237.0000 mL | Freq: Three times a day (TID) | ORAL | Status: DC
  Administered 2022-01-23: 237 mL via ORAL

## 2022-01-21 MED ORDER — TOPIRAMATE 25 MG PO TABS
25.0000 mg | ORAL_TABLET | Freq: Two times a day (BID) | ORAL | Status: DC
  Administered 2022-01-21 – 2022-01-27 (×12): 25 mg via ORAL
  Filled 2022-01-21 (×13): qty 1

## 2022-01-21 MED ORDER — GABAPENTIN 100 MG PO CAPS: 200.0000 mg | ORAL_CAPSULE | Freq: Three times a day (TID) | ORAL | Status: DC

## 2022-01-21 MED ORDER — FOLIC ACID 1 MG PO TABS
1.0000 mg | ORAL_TABLET | Freq: Every day | ORAL | Status: DC
  Administered 2022-01-22 – 2022-01-27 (×6): 1 mg via ORAL
  Filled 2022-01-21 (×6): qty 1

## 2022-01-21 MED ORDER — LEVETIRACETAM 250 MG PO TABS
500.0000 mg | ORAL_TABLET | Freq: Two times a day (BID) | ORAL | Status: DC
  Administered 2022-01-21 – 2022-01-27 (×12): 500 mg via ORAL
  Filled 2022-01-21 (×12): qty 2

## 2022-01-21 NOTE — Progress Notes (Addendum)
PMR Admission Coordinator Pre-Admission Assessment   Patient: Judy Walsh is an 46 y.o., female MRN: [id number] DOB: [birth year] Height: '5\' 8"'  (172.7 cm) Weight: 74.2 kg   Insurance Information HMO:     PPO:      PCP:      IPA:      80/20:      OTHER: HSA PRIMARY: [employer]      Policy#: [id number]      Subscriber: pt CM Name: portal approval      Phone#:      Fax#: [phone] Pre-Cert#: [id number] auth through 7-3 with updates due to fax listed above    Employer:  Benefits:  Phone #: [phone]     Name:  Eff. Date: 07/28/21     Deduct: $0      Out of Pocket Max: $4500 (met $3152.28)      Life Max: n/a CIR: 90%      SNF: 90% Outpatient: 90%     Co-Ins: 10% Home Health: 90%      Co-Ins: 10% DME: 90%     Co-Ins: 10% Providers:  SECONDARY:       Policy#:      Phone#:    Development worker, community:       Phone#:    The Therapist, art Information Summary" for patients in Inpatient Rehabilitation Facilities with attached "Privacy Act [place] Records" was provided and verbally reviewed with: N/A   Emergency Contact Information Contact Information       Name Relation Home Work Mobile    [place] Son     [phone]    [facility] Mother     [phone]    Pickett,Robert Father     [phone]           Current Medical History  Patient Admitting Diagnosis: ICH    History of Present Illness: Pt is a 46 y/o female with PMH of myopericarditis admitted to [facility] on 6/12 with severe headache, nausea and vomiting.  Vitals stable in ED.  Labs showed K 3.0, CO2 21, and glucose 135.  Head CT showed moderate amount of acute subarachnoid hemorrhage along falx suprasellar cistern, sylvian fissures, and basal cisterns.  CTA showed outpouching of supraclinoid ICA, consistent with an aneurysm rupture.  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  Enlargement of ventricles noted but no hydrocephalus initially.  Pt with rapid decline and development of cerebral edema on 6/12 so emergent EVD  was placed at bedside.  PCCM consulted 6/12 for intubation.  Pt underwent aneurysm coiling per Dr. Kathyrn Sheriff on 6/13.  She was extubated on 6/14 and tolerated EVD clamping trials with eventual d/c of EVD on 6/20.  Pt with vasospasms showing on TCD but not clinically.  Monitored for clinical signs with option for vasopressors if needed.  Therapy ongoing and recommendations are for CIR.    Complete NIHSS TOTAL: 0   [facility]'s [facility] record from [facility] has been reviewed by the rehabilitation admission coordinator and physician.   Past Medical History      Past Medical History:  Diagnosis Date   Chest x-ray abnormality 06/09/2021    CXR 11/22: Vague nodular opacities in the mid left lung and right upper lobe which may be due to overlapping structures. Comparison with older imaging studies or follow-up is recommended to exclude pulmonary nodule.    Myopericarditis 06/08/2021    admx 11/22 >> hsTrop mildly elevated w/o trend; BNP and CRP high - trending down at DC // Echocardiogram 11/22: no effusion, EF 55-60,  no RWMA, mild LVH >> NSAIDs/Colchicine      Has the patient had major surgery during 100 days prior to admission? Yes   Family History   family history includes CVA in her father.   Current Medications   Current Facility-Administered Medications:    0.9 %  sodium chloride infusion, , Intravenous, Continuous, Kipp Brood, MD, Stopped at 01/21/22 1062   bisacodyl (DULCOLAX) suppository 10 mg, 10 mg, Rectal, Daily PRN, Omar Person, NP   Chlorhexidine Gluconate Cloth 2 % PADS 6 each, 6 each, Topical, Daily, Julian Hy, DO, 6 each at 01/20/22 2100   docusate sodium (COLACE) capsule 100 mg, 100 mg, Oral, Daily PRN, Chesley Mires, MD   enoxaparin (LOVENOX) injection 40 mg, 40 mg, Subcutaneous, Q24H, Agarwala, Ravi, MD, 40 mg at 01/21/22 0938   feeding supplement (ENSURE ENLIVE / ENSURE PLUS) liquid 237 mL, 237 mL, Oral, TID BM, Consuella Lose, MD, 237 mL at 69/48/54  6270   folic acid (FOLVITE) tablet 1 mg, 1 mg, Oral, Daily, Beldon, Brianna S, RPH, 1 mg at 01/21/22 0938   gabapentin (NEURONTIN) capsule 200 mg, 200 mg, Oral, Q8H, Nundkumar, Nena Polio, MD, 200 mg at 01/21/22 0819   ibuprofen (ADVIL) tablet 400 mg, 400 mg, Oral, Q6H PRN, Agarwala, Ravi, MD, 400 mg at 01/21/22 0522   levETIRAcetam (KEPPRA) tablet 500 mg, 500 mg, Oral, BID, Beldon, Brianna S, RPH, 500 mg at 01/21/22 0938   melatonin tablet 5 mg, 5 mg, Oral, QHS, Ogan, Okoronkwo U, MD, 5 mg at 01/20/22 2233   methocarbamol (ROBAXIN) tablet 500 mg, 500 mg, Oral, Q6H PRN, Kipp Brood, MD, 500 mg at 01/20/22 2232   multivitamin with minerals tablet 1 tablet, 1 tablet, Oral, Daily, Liz Beach, RPH, 1 tablet at 01/21/22 0938   niMODipine (NIMOTOP) capsule 60 mg, 60 mg, Oral, Q4H, 60 mg at 01/21/22 0819 **OR** niMODipine (NYMALIZE) 6 MG/ML oral solution 60 mg, 60 mg, Oral, Q4H, Beldon, Brianna S, RPH, 60 mg at 01/18/22 0331   ondansetron (ZOFRAN-ODT) disintegrating tablet 4 mg, 4 mg, Oral, Q6H PRN **OR** ondansetron (ZOFRAN) injection 4 mg, 4 mg, Intravenous, Q6H PRN, Meyran, Ocie Cornfield, NP, 4 mg at 01/08/22 1419   Oral care mouth rinse, 15 mL, Mouth Rinse, PRN, Consuella Lose, MD   polyethylene glycol (MIRALAX / GLYCOLAX) packet 17 g, 17 g, Oral, Daily, Sood, Vineet, MD   senna (SENOKOT) tablet 17.2 mg, 2 tablet, Oral, QHS PRN, Chesley Mires, MD   Patients Current Diet:  Diet Order                  Diet - low sodium heart healthy             Diet regular Room service appropriate? Yes with Assist; Fluid consistency: Thin  Diet effective now                         Precautions / Restrictions Precautions Precautions: Fall Precaution Comments: SBP goal 130-150 Restrictions Weight Bearing Restrictions: No    Has the patient had 2 or more falls or a fall with injury in the past year? No   Prior Activity Level Community (5-7x/wk): working 2 jobs, driving, has a 19 y/o  daughter and an adult son who lives outside the home   Prior Functional Level Self Care: Did the patient need help bathing, dressing, using the toilet or eating? Independent   Indoor Mobility: Did the patient need assistance with walking from room  to room (with or without device)? Independent   Stairs: Did the patient need assistance with internal or external stairs (with or without device)? Independent   Functional Cognition: Did the patient need help planning regular tasks such as shopping or remembering to take medications? Independent   Patient Information Are you of Hispanic, Latino/a,or Spanish origin?: A. No, not of Hispanic, Latino/a, or Spanish origin What is your race?: A. White Do you need or want an interpreter to communicate with a doctor or health care staff?: 0. No   Patient's Response To:  Health Literacy and Transportation Is the patient able to respond to health literacy and transportation needs?: Yes Health Literacy - How often do you need to have someone help you when you read instructions, pamphlets, or other written material from your doctor or pharmacy?: Never In the past 12 months, has lack of transportation kept you from medical appointments or from getting medications?: No In the past 12 months, has lack of transportation kept you from meetings, work, or from getting things needed for daily living?: No   Home Assistive Devices / Equipment Home Equipment: None   Prior Device Use: Indicate devices/aids used by the patient prior to current illness, exacerbation or injury? None of the above   Current Functional Level Cognition   Arousal/Alertness: Lethargic Overall Cognitive Status: Impaired/Different from baseline Difficult to assess due to: Level of arousal Current Attention Level: Selective Orientation Level: Oriented X4 Following Commands: Follows one step commands consistently, Follows multi-step commands inconsistently Safety/Judgement: Decreased  awareness of safety, Decreased awareness of deficits General Comments: pt easily distracted, can be tangential, pt unable to count backwards by 3 while walking. when asked if she was home alone and there was a fire pt responded "Id go out the front door." PT asked if there was anything else she would do, pt stated "Id go tell my neighbor" again PT asked if there was anything else she would do, pt unable to state. PT then asked why she would go to the neighbors "because I need a phone to call the police" pt required multiple verbal cues to obtain an appropriate plan Attention: Focused, Sustained Focused Attention: Impaired Focused Attention Impairment: Verbal complex Sustained Attention: Impaired Sustained Attention Impairment: Verbal basic Memory: Impaired Memory Impairment:  (Immediate: 5/5; delayed: 0/5; with cues: 0/5) Awareness: Impaired Awareness Impairment: Intellectual impairment Problem Solving: Impaired Problem Solving Impairment: Verbal basic    Extremity Assessment (includes Sensation/Coordination)   Upper Extremity Assessment: Generalized weakness RUE Deficits / Details: Diffult to fully assess - RUE noted to be left in unsafe positions. Pt also reacting for grooming items with non-dominant LUE RUE Sensation: decreased proprioception RUE Coordination: decreased fine motor, decreased gross motor  Lower Extremity Assessment: Defer to PT evaluation RLE Deficits / Details: grossly 3/5 to MMT, unable to fully discern if due to cognition but pt was able to complete MMT with 4+/5 to MMT with LLE RLE Sensation: decreased light touch LLE Deficits / Details: grossly 4+/5 to MMT     ADLs   Overall ADL's : Needs assistance/impaired Eating/Feeding: Set up Grooming: Wash/dry hands, Wash/dry face, Oral care, Applying deodorant, Standing, Supervision/safety Grooming Details (indicate cue type and reason): stood at sink while talking to MD Upper Body Bathing: Minimal assistance Lower Body  Bathing: Sit to/from stand, Minimal assistance Upper Body Dressing : Minimal assistance Lower Body Dressing: Supervision/safety, Sitting/lateral leans Lower Body Dressing Details (indicate cue type and reason): dons socks Toilet Transfer: Min guard, Regular Toilet, Horticulturist, commercial Details (  indicate cue type and reason): simulated in room Toileting- Clothing Manipulation and Hygiene: Minimal assistance Functional mobility during ADLs: Minimal assistance, Rolling walker (2 wheels), Cueing for safety General ADL Comments: pt benefits from cues throughout for arousal, attention, sequencing and safety     Mobility   Overal bed mobility: Modified Independent Bed Mobility: Supine to Sit Supine to sit: Modified independent (Device/Increase time), HOB elevated Sit to supine: Modified independent (Device/Increase time) General bed mobility comments: pt did not use bed rail, HOB at 45 deg     Transfers   Overall transfer level: Needs assistance Equipment used: None Transfers: Sit to/from Stand Sit to Stand: Supervision Bed to/from chair/wheelchair/BSC transfer type:: Step pivot Step pivot transfers: Mod assist, +2 physical assistance, +2 safety/equipment General transfer comment: min guard for safety as pt quick to move and mildly unsteady     Ambulation / Gait / Stairs / Wheelchair Mobility   Ambulation/Gait Ambulation/Gait assistance: Herbalist (Feet): 250 Feet Assistive device: None Gait Pattern/deviations: Step-through pattern, Decreased stride length, Decreased dorsiflexion - left, Narrow base of support General Gait Details: pt with unstable L LE, especially at ankle, pt vearing L/R, with tendency to hug R side of hallway, pt with instabilty when turning head L/R, pt with LOB during moderate perturbations Gait velocity: dec compared to baseline Gait velocity interpretation: 1.31 - 2.62 ft/sec, indicative of limited community ambulator Stairs: Yes Stairs  assistance: Min assist Stair Management: One rail Right, Alternating pattern, Forwards Number of Stairs: 4 (limited by IV pole) General stair comments: pt unsafe, impulsive, went down the stairs backwards after walking up despite verbal cues to turn around     Posture / Balance Dynamic Sitting Balance Sitting balance - Comments: Static sitting EOB with min guard for safety Balance Overall balance assessment: Needs assistance Sitting-balance support: Feet supported, No upper extremity supported Sitting balance-Leahy Scale: Good Sitting balance - Comments: Static sitting EOB with min guard for safety Postural control: Posterior lean, Right lateral lean Standing balance support: During functional activity Standing balance-Leahy Scale: Good Standing balance comment: When not using UE support for standing mobility, pt required up to min-modA. When using RW, pt required min guard-minA. LOB bouts noted Standardized Balance Assessment Standardized Balance Assessment : Dynamic Gait Index Dynamic Gait Index Level Surface: Moderate Impairment Change in Gait Speed: Moderate Impairment Gait with Horizontal Head Turns: Moderate Impairment Gait with Vertical Head Turns: Moderate Impairment Gait and Pivot Turn: Moderate Impairment Step Over Obstacle: Moderate Impairment Step Around Obstacles: Moderate Impairment Steps: Moderate Impairment Total Score: 8     Special needs/care consideration N/a    Previous Home Environment (from acute therapy documentation) Living Arrangements: Children  Lives With: Daughter Available Help at Discharge: Available 24 hours/day Type of Home: House Home Layout: Two level, Able to live on main level with bedroom/bathroom Alternate Level Stairs-Number of Steps: flight Home Access: Stairs to enter Entrance Stairs-Rails: None Entrance Stairs-Number of Steps: 2 Bathroom Shower/Tub: Multimedia programmer: Standard   Discharge Living Setting Plans for  Discharge Living Setting: Lives with (comment) (mom and dad's house at discharge) Type of Home at Discharge: House Discharge Home Layout: Two level, Able to live on main level with bedroom/bathroom Discharge Home Access: Stairs to enter Entrance Stairs-Rails: Left Entrance Stairs-Number of Steps: 2 Discharge Bathroom Shower/Tub: Tub/shower unit Discharge Bathroom Toilet: Handicapped height Discharge Bathroom Accessibility: Yes How Accessible: Accessible via walker Does the patient have any problems obtaining your medications?: No   Social/Family/Support Systems Patient Roles: Parent Contact  Information: daughter is 42, has an older son who is married and lives outside the home Anticipated Caregiver: mom and dad Stanton Kidney and Karie Fetch) Anticipated Caregiver's Contact Information: Stanton Kidney [phone] Ability/Limitations of Caregiver: n/a Caregiver Availability: 24/7 Discharge Plan Discussed with Primary Caregiver: Yes Is Caregiver In Agreement with Plan?: Yes Does Caregiver/Family have Issues with Lodging/Transportation while Pt is in Rehab?: No   Goals Patient/Family Goal for Rehab: PT/OT supervision to mod I, SLP supervision Expected length of stay: 9-11 days Pt/Family Agrees to Admission and willing to participate: Yes Program Orientation Provided & Reviewed with Pt/Caregiver Including Roles  & Responsibilities: Yes  Barriers to Discharge: Insurance for SNF coverage   Decrease burden of Care through IP rehab admission: n/a   Possible need for SNF placement upon discharge: Not anticipated.  Reviewed with pt, son, and mother that 24/7 supervision would be recommended at discharge.     Patient Condition: I have reviewed medical records from [facility], spoken with  [facility] team , and patient, son, and family member. I met with patient at the bedside and discussed via phone for inpatient rehabilitation assessment.  Patient will benefit from ongoing PT, OT, and SLP, can actively participate  in 3 hours of therapy a day 5 days of the week, and can make measurable gains during the admission.  Patient will also benefit from the coordinated team approach during an Inpatient Acute Rehabilitation admission.  The patient will receive intensive therapy as well as Rehabilitation physician, nursing, social worker, and care management interventions.  Due to bladder management, bowel management, safety, skin/wound care, disease management, medication administration, pain management, and patient education the patient requires 24 hour a day rehabilitation nursing.  The patient is currently min to mod assist with mobility and basic ADLs.  Discharge setting and therapy post discharge at home with home health is anticipated.  Patient has agreed to participate in the Acute Inpatient Rehabilitation Program and will admit today.   Preadmission Screen Completed By:  Michel Santee, PT, DPT 01/21/2022 12:43 PM ______________________________________________________________________   Discussed status with Dr. Naaman Plummer on 01/21/22 at 12:43 PM and received approval for admission today.   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 Yes Co-Morbidities requiring supervision/potential complications: HTN Due to bladder management, bowel management, safety, skin/wound care, disease management, medication administration, pain management, and patient education, does the patient require 24 hr/day rehab nursing? Yes Does the patient require coordinated care of a physician, rehab nurse, PT, OT, and SLP to address physical and functional deficits in the context of the above medical diagnosis(es)? Yes Addressing deficits in the following  areas: balance, endurance, locomotion, strength, transferring, bowel/bladder control, bathing, dressing, feeding, grooming, toileting, cognition, and psychosocial support Can the patient actively participate in an intensive therapy program of at least 3 hrs of therapy 5 days a week? Yes The potential for patient to make measurable gains while on inpatient rehab is excellent Anticipated functional outcomes upon discharge from inpatient rehab: modified independent and supervision PT, modified independent and supervision OT, modified independent SLP Estimated rehab length of stay to reach the above functional goals is: 7-10 days Anticipated discharge destination: Home 10. Overall Rehab/Functional Prognosis: excellent     MD Signature: Alroy Dust  Alen Blew, MD, Andrew Director Rehabilitation Services 01/21/2022

## 2022-01-21 NOTE — Discharge Instructions (Addendum)
Inpatient Rehab Discharge Instructions  ELIAS BORDNER Discharge date and time: No discharge date for patient encounter.   Activities/Precautions/ Functional Status: Activity: As tolerated Diet: Regular Wound Care: Routine skin checks Functional status:  ___ No restrictions     ___ Walk up steps independently ___ 24/7 supervision/assistance   ___ Walk up steps with assistance ___ Intermittent supervision/assistance  ___ Bathe/dress independently ___ Walk with walker     _x__ Bathe/dress with assistance ___ Walk Independently    ___ Shower independently ___ Walk with assistance    ___ Shower with assistance ___ No alcohol     ___ Return to work/school ________   COMMUNITY REFERRALS UPON DISCHARGE:    Outpatient: PT     OT    ST                 Agency: [facility]    Phone:[phone]              Appointment Date/Time:*Please expect follow-up within 7-10 business days to schedule your appointment. If you have not received follow-up, be sure to contact the site directly.*     Special Instructions: No driving smoking or alcohol   My questions have been answered and I understand these instructions. I will adhere to these goals and the provided educational materials after my discharge from the hospital.  Patient/Caregiver Signature _______________________________ Date __________  Clinician Signature _______________________________________ Date __________  Please bring this form and your medication list with you to all your follow-up doctor's appointments.

## 2022-01-21 NOTE — Progress Notes (Signed)
Occupational Therapy Treatment Patient Details Name: Judy Walsh MRN: [id number] DOB: [birth year] Today's Date: 01/21/2022   History of present illness Judy Walsh is a 46 yo female who presented 6/12 with severe headache with nausea and vomiting, found to have subarachnoid hemorrhage with eventual development of cerebral edema requiring emergent placement of EVD. 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 PMHx: myopericarditis.   OT comments  Pt progressing towards goals (goals updated). Pt able to complete seated/standing ADLs with supervision-min guard this session, mod I for bed mobility and supervision for transfers without AD. Pt with decreased attention to task during session, needing min cues for redirection. Pt presenting with impairments listed below, will follow acutely. Continue to recommend AIR at d/c.   Recommendations for follow up therapy are one component of a multi-disciplinary discharge planning process, led by the attending physician.  Recommendations may be updated based on patient status, additional functional criteria and insurance authorization.    Follow Up Recommendations  Acute inpatient rehab (3hours/day)    Assistance Recommended at Discharge Frequent or constant Supervision/Assistance  Patient can return home with the following  Assistance with cooking/housework;Direct supervision/assist for medications management;Assist for transportation;Help with stairs or ramp for entrance;A little help with walking and/or transfers;A little help with bathing/dressing/bathroom;Direct supervision/assist for financial management   Equipment Recommendations  BSC/3in1    Recommendations for Other Services Rehab consult    Precautions / Restrictions Precautions Precautions: Fall Precaution Comments: SBP goal 130-150 Restrictions Weight Bearing Restrictions: No       Mobility Bed  Mobility Overal bed mobility: Modified Independent Bed Mobility: Supine to Sit     Supine to sit: Modified independent (Device/Increase time), HOB elevated Sit to supine: Modified independent (Device/Increase time)        Transfers Overall transfer level: Needs assistance Equipment used: None Transfers: Sit to/from Stand Sit to Stand: Supervision                 Balance Overall balance assessment: Needs assistance Sitting-balance support: Feet supported, No upper extremity supported Sitting balance-Leahy Scale: Good     Standing balance support: During functional activity Standing balance-Leahy Scale: Good                             ADL either performed or assessed with clinical judgement   ADL Overall ADL's : Needs assistance/impaired     Grooming: Wash/dry hands;Wash/dry face;Oral care;Applying deodorant;Standing;Supervision/safety Grooming Details (indicate cue type and reason): stood at sink while talking to MD             Lower Body Dressing: Supervision/safety;Sitting/lateral leans Lower Body Dressing Details (indicate cue type and reason): dons socks Toilet Transfer: Solicitor;Ambulation Toilet Transfer Details (indicate cue type and reason): simulated in room         Functional mobility during ADLs: Minimal assistance;Rolling walker (2 wheels);Cueing for safety      Extremity/Trunk Assessment Upper Extremity Assessment Upper Extremity Assessment: Generalized weakness   Lower Extremity Assessment Lower Extremity Assessment: Defer to PT evaluation        Vision   Vision Assessment?: Vision impaired- to be further tested in functional context Additional Comments: still reporting needing to bring things closer to read them   Perception Perception Perception: Not tested   Praxis Praxis Praxis: Not tested    Cognition Arousal/Alertness: Awake/alert Behavior During Therapy: [facility] for tasks  assessed/performed Overall Cognitive Status: Impaired/Different from  baseline Area of Impairment: Attention                   Current Attention Level: Selective Memory: Decreased short-term memory Following Commands: Follows one step commands consistently, Follows multi-step commands inconsistently Safety/Judgement: Decreased awareness of safety, Decreased awareness of deficits Awareness: Emergent Problem Solving: Requires verbal cues          Exercises      Shoulder Instructions       General Comments VSS on RA, family present in room    Pertinent Vitals/ Pain       Pain Assessment Pain Assessment: Faces Pain Score: 4  Faces Pain Scale: Hurts little more Pain Location: headache Pain Descriptors / Indicators: Headache Pain Intervention(s): Limited activity within patient's tolerance, Repositioned, Monitored during session  Home Living                                          Prior Functioning/Environment              Frequency  Min 2X/week        Progress Toward Goals  OT Goals(current goals can now be found in the care plan section)  Progress towards OT goals: Goals met and updated - see care plan  Acute Rehab OT Goals Patient Stated Goal: to get better OT Goal Formulation: With patient Time For Goal Achievement: 02/04/22 ADL Goals Additional ADL Goal #2: Pt will receive passing score on pillbox assessment in prep for med mgmt Additional ADL Goal #3: Pt will perform 3 step trailmaking task in prep for ADLs  Plan Discharge plan remains appropriate    Co-evaluation                 AM-PAC OT "6 Clicks" Daily Activity     Outcome Measure   Help from another person eating meals?: None Help from another person taking care of personal grooming?: None Help from another person toileting, which includes using toliet, bedpan, or urinal?: None Help from another person bathing (including washing, rinsing, drying)?: A  Little Help from another person to put on and taking off regular upper body clothing?: A Little Help from another person to put on and taking off regular lower body clothing?: A Little 6 Click Score: 21    End of Session Equipment Utilized During Treatment: Gait belt  OT Visit Diagnosis: Unsteadiness on feet (R26.81);Other abnormalities of gait and mobility (R26.89);Muscle weakness (generalized) (M62.81);Pain Pain - part of body:  (headache)   Activity Tolerance Patient tolerated treatment well   Patient Left in bed;with call bell/phone within reach;with bed alarm set;with family/visitor present   Nurse Communication Mobility status        Time: 1610-9604 OT Time Calculation (min): 19 min  Charges: OT General Charges $OT Visit: 1 Visit OT Treatments $Self Care/Home Management : 8-22 mins  Alfonzo Beers, OTD, OTR/L Acute Rehab [phone]   Mayer Masker 01/21/2022, 12:24 PM

## 2022-01-21 NOTE — Progress Notes (Addendum)
Inpatient Rehab Admissions Coordinator:    I have insurance approval and a bed available for pt to admit to CIR today. Awaiting confirmation from Dr. Conchita Paris that she is ready.  Will let pt/family and TOC team know.   1245: plan to admit to CIR today.   Estill Dooms, PT, DPT Admissions Coordinator [phone] 01/21/22  9:37 AM

## 2022-01-22 LAB — CBC WITH DIFFERENTIAL/PLATELET
Abs Immature Granulocytes: 0.06 10*3/uL (ref 0.00–0.07)
Basophils Absolute: 0.1 10*3/uL (ref 0.0–0.1)
Basophils Relative: 1 %
Eosinophils Absolute: 0.1 10*3/uL (ref 0.0–0.5)
Eosinophils Relative: 2 %
HCT: 41.6 % (ref 36.0–46.0)
Hemoglobin: 14.4 g/dL (ref 12.0–15.0)
Immature Granulocytes: 1 %
Lymphocytes Relative: 16 %
Lymphs Abs: 0.8 10*3/uL (ref 0.7–4.0)
MCH: 30.1 pg (ref 26.0–34.0)
MCHC: 34.6 g/dL (ref 30.0–36.0)
MCV: 86.8 fL (ref 80.0–100.0)
Monocytes Absolute: 0.6 10*3/uL (ref 0.1–1.0)
Monocytes Relative: 12 %
Neutro Abs: 3.4 10*3/uL (ref 1.7–7.7)
Neutrophils Relative %: 68 %
Platelets: 373 10*3/uL (ref 150–400)
RBC: 4.79 MIL/uL (ref 3.87–5.11)
RDW: 11.6 % (ref 11.5–15.5)
WBC: 5 10*3/uL (ref 4.0–10.5)
nRBC: 0 % (ref 0.0–0.2)

## 2022-01-22 LAB — COMPREHENSIVE METABOLIC PANEL
ALT: 32 U/L (ref 0–44)
AST: 28 U/L (ref 15–41)
Albumin: 3.7 g/dL (ref 3.5–5.0)
Alkaline Phosphatase: 59 U/L (ref 38–126)
Anion gap: 15 (ref 5–15)
BUN: 11 mg/dL (ref 6–20)
CO2: 21 mmol/L — ABNORMAL LOW (ref 22–32)
Calcium: 9.7 mg/dL (ref 8.9–10.3)
Chloride: 100 mmol/L (ref 98–111)
Creatinine, Ser: 0.67 mg/dL (ref 0.44–1.00)
GFR, Estimated: >60 mL/min (ref >60)
Glucose, Bld: 111 mg/dL — ABNORMAL HIGH (ref 70–99)
Potassium: 4.1 mmol/L (ref 3.5–5.1)
Sodium: 136 mmol/L (ref 135–145)
Total Bilirubin: 0.3 mg/dL (ref 0.3–1.2)
Total Protein: 6.9 g/dL (ref 6.5–8.1)

## 2022-01-22 MED ORDER — NIMODIPINE 30 MG PO CAPS
60.0000 mg | ORAL_CAPSULE | ORAL | Status: DC
  Administered 2022-01-22 – 2022-01-27 (×31): 60 mg via ORAL
  Filled 2022-01-22 (×32): qty 2

## 2022-01-22 NOTE — Evaluation (Addendum)
Speech Language Pathology Assessment and Plan  Patient Details  Name: Judy Walsh MRN: [id number] Date of Birth: Jun 18, 1976  SLP Diagnosis: Cognitive Impairments  Rehab Potential: Excellent ELOS: ~2 weeks   Today's Date: 01/22/2022 SLP Individual Time: 0730-0822 SLP Individual Time Calculation (min): 49 min   Hospital Problem: Principal Problem:   Subarachnoid hemorrhage [facility])  Past Medical History:  Past Medical History:  Diagnosis Date   Chest x-ray abnormality 06/09/2021   CXR 11/22: Vague nodular opacities in the mid left lung and right upper lobe which may be due to overlapping structures. Comparison with older imaging studies or follow-up is recommended to exclude pulmonary nodule.    Myopericarditis 06/08/2021   admx 11/22 >> hsTrop mildly elevated w/o trend; BNP and CRP high - trending down at DC // Echocardiogram 11/22: no effusion, EF 55-60, no RWMA, mild LVH >> NSAIDs/Colchicine   Past Surgical History:  Past Surgical History:  Procedure Laterality Date   IR ANGIO INTRA EXTRACRAN SEL INTERNAL CAROTID BILAT MOD SED  01/07/2022   IR ANGIO VERTEBRAL SEL VERTEBRAL UNI R MOD SED  01/08/2022   IR ANGIOGRAM FOLLOW UP STUDY  01/07/2022   IR ANGIOGRAM FOLLOW UP STUDY  01/07/2022   IR ANGIOGRAM FOLLOW UP STUDY  01/07/2022   IR ANGIOGRAM FOLLOW UP STUDY  01/07/2022   IR NEURO EACH ADD'L AFTER BASIC UNI LEFT (MS)  01/08/2022   IR NEURO EACH ADD'L AFTER BASIC UNI RIGHT (MS)  01/08/2022   IR TRANSCATH/EMBOLIZ  01/07/2022   RADIOLOGY WITH ANESTHESIA N/A 01/07/2022   Procedure: IR WITH ANESTHESIA;  Surgeon: Consuella Lose, MD;  Location: [place];  Service: Radiology;  Laterality: N/A;    Assessment / Plan / Recommendation Clinical Impression History of Present Illness: Pt is a 46 y/o female with PMH of myopericarditis admitted to [facility] on 6/12 with severe headache, nausea and vomiting.  Vitals stable in ED.  Labs showed K 3.0, CO2 21, and glucose 135.  Head CT showed moderate  amount of acute subarachnoid hemorrhage along falx suprasellar cistern, sylvian fissures, and basal cisterns.  CTA showed outpouching of supraclinoid ICA, consistent with an aneurysm rupture.  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  Enlargement of ventricles noted but no hydrocephalus initially.  Pt with rapid decline and development of cerebral edema on 6/12 so emergent EVD was placed at bedside.  PCCM consulted 6/12 for intubation.  Pt underwent aneurysm coiling per Dr. Kathyrn Sheriff on 6/13.  She was extubated on 6/14 and tolerated EVD clamping trials with eventual d/c of EVD on 6/20.  Pt with vasospasms showing on TCD but not clinically.  Monitored for clinical signs with option for vasopressors if needed.  Therapy ongoing and recommendations are for CIR. Admitted to CIR on 01/21/2022.  Clinical swallow, motor speech, and cognitive-linguistic evaluation completed s/p ICA aneurysm rupture. Pt received asleep and lying in bed. Per chart review, pt with difficulty sleeping the previous evening due to headache. LPN present and providing medication for head pain. Mother present. Agreeable to ST evaluation.   Per clinical swallow evaluation, pt presents with functional oropharyngeal deglutition with no apparent focal, orofacial deficits nor s/sx concerning for laryngeal invasion/aspiration with challenging PO trials for sequential thin liquid via straw and regular textures. At this time, oral motor skills support a regular diet with thin liquids given general aspiration precautions to include pt must be upright for all PO intake and awake/alert. Motor speech unremarkable; no dysarthria and apraxia appreciated. Receptive and expressive language deficits appear r/t cognitive  deficits to include decreased attention, organization, processing speed, and working memory vs aphasia. Per informal and formal assessment measures, pt presents with at least mild-moderate cognitive-linguistic deficits in the domains of attention, memory,  executive functioning, and language; visuospatial skills not directly assessed this date due to pt fatigue and pain. Per chart review and pt interviewing, pt with high PLOF and independent with all ADL's and iADL's. Of note, pt's performance appeared limited by pain and lethargy this date. Therefore, missed 8 minutes of today's evaluation.   Given assessment findings, high PLOF, and pt/family reports, pt would benefit from skilled ST intervention, during CIR admission, to target cognitive-linguistic deficits outlined above, maximize pt independence, and decrease caregiver burden. Results and recommendations reviewed with pt and her mother who verbalized understanding and agreeable with proposed ST POC. Please see below for objective details.   Skilled Therapeutic Interventions          CSE and cognitive-linguistic evaluation completed via administration of COGNISTAT and portions of the CLQT. Please see above for details.    SLP Assessment  Patient will need skilled Speech Lanaguage Pathology Services during CIR admission    Recommendations  SLP Diet Recommendations: Age appropriate regular solids;Thin Liquid Administration via: Cup;Straw Medication Administration: Whole meds with liquid Supervision: Patient able to self feed Compensations: Slow rate;Small sips/bites Postural Changes and/or Swallow Maneuvers: Seated upright 90 degrees Oral Care Recommendations: Oral care BID Recommendations for Other Services: Neuropsych consult Patient destination: Home Follow up Recommendations: Outpatient SLP;24 hour supervision/assistance Equipment Recommended: None recommended by SLP    SLP Frequency 3 to 5 out of 7 days   SLP Duration  SLP Intensity  SLP Treatment/Interventions 10-12 days  Minumum of 1-2 x/day, 30 to 90 minutes  Cognitive remediation/compensation;Functional tasks;Internal/external aids;Patient/family education;Cueing hierarchy    Pain Pain Assessment Pain Scale: 0-10 Pain  Score: 5  Pain Type: Acute pain Pain Location: Head Pain Orientation: Anterior Pain Descriptors / Indicators: Aching Pain Onset: Progressive Patients Stated Pain Goal: 5 Pain Intervention(s): Medication (See eMAR) (LPN provided) Multiple Pain Sites: Yes 2nd Pain Site Pain Score: 5 Pain Type: Acute pain Pain Location: Back Pain Orientation: Lower Pain Frequency: Constant Pain Onset: On-going  Prior Functioning Cognitive/Linguistic Baseline: Within functional limits Type of Home: House  Lives With: Daughter Available Help at Discharge: Available 24 hours/day;Family Education: Bachelor's Degree 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, systems Overall Cognitive Status: Impaired/Different from baseline Arousal/Alertness: Lethargic Orientation Level: Oriented x4 (COGNISTAT orientation subtest score - 11/12 - WFL) Year: 2023 Month: June Day of Week: Incorrect Attention: Focused;Sustained (COGNISTAT attention subtest score - 6/8 - WFL) Focused Attention: Impaired Focused Attention Impairment: Verbal complex Sustained Attention: Impaired Sustained Attention Impairment: Verbal complex;Functional complex;Functional basic Memory: Impaired (COGNISTAT delayed recall subtest score - 0/12 - severe to profound impairment) Memory Impairment: Retrieval deficit;Decreased recall of new information;Decreased short term memory Decreased Short Term Memory: Verbal basic Awareness: Impaired  Awareness Impairment: Intellectual impairment;Emergent impairment Problem Solving: Impaired (COGNISTAT mental math calculation subtest score - 1/4 - moderate impairment, abstract verbal reasoning subtest score - 6/8 - WFL) Problem Solving Impairment: Verbal basic;Verbal complex Executive Function: Organizing;Self Correcting Organizing: Impaired Organizing Impairment: Verbal basic Self Correcting: Impaired Self Correcting Impairment: Verbal complex;Verbal basic Behaviors:  Impulsive (mildly impulsive) Safety/Judgment: Impaired  (COGNISTAT verbal judgment subtest score - 4/6 - WFL)  Comprehension Auditory Comprehension Overall Auditory Comprehension: Impaired (in the setting of impaired working memory vs auditory comprehension deficit) Yes/No Questions: Impaired Basic Biographical Questions: 76-100% accurate (100% accuracy)  Basic Immediate Environment Questions: 75-100% accurate (100% accuracy) Complex Questions: 75-100% accurate (75% accuracy) Commands: Impaired Multistep Basic Commands: 75-100% accurate (100% accuracy) Complex Commands: 75-100% accurate (50% accuracy, decreased accuracy due to cognitive impairments of attention and working memory) Conversation: Simple Interfering Components: Attention;Pain;Processing speed;Working memory EffectiveTechniques: Extra processing time;Repetition;Slowed speech Visual Recognition/Discrimination Discrimination: Not tested Reading Comprehension Reading Status: Not tested  Expression Expression Primary Mode of Expression: Verbal Verbal Expression Overall Verbal Expression: Impaired Initiation: No impairment Level of Generative/Spontaneous Verbalization: Conversation Repetition: No impairment Naming: Impairment Confrontation: Within functional limits (COGNISTAT naming subtest score - 8/8 - WFL) Convergent: 50-74% accurate (50% accuracy)  Divergent: 50-74% accurate (CLQT generative naming subtest score - 2 (n=5) Verbal Errors: Phonemic paraphasias;Not aware of errors Pragmatics: No impairment Effective Techniques: Semantic cues;Sentence completion;Phonemic cues Non-Verbal Means of Communication: Not applicable Written Expression Dominant Hand: Right Written Expression: Not tested  Oral Motor Oral Motor/Sensory Function Overall Oral Motor/Sensory Function: Within functional limits Motor Speech Overall Motor Speech: Appears within functional limits for tasks assessed Respiration: Within functional  limits Phonation: Normal Resonance: Within functional limits Articulation: Within functional limitis Intelligibility: Intelligible Motor Planning: Witnin functional limits Motor Speech Errors: Not applicable  Care Tool Care Tool Cognition Ability to hear (with hearing aid or hearing appliances if normally used Ability to hear (with hearing aid or hearing appliances if normally used): 0. Adequate - no difficulty in normal conservation, social interaction, listening to TV   Expression of Ideas and Wants Expression of Ideas and Wants: 3. Some difficulty - exhibits some difficulty with expressing needs and ideas (e.g, some words or finishing thoughts) or speech is not clear   Understanding Verbal and Non-Verbal Content Understanding Verbal and Non-Verbal Content: 3. Usually understands - understands most conversations, but misses some part/intent of message. Requires cues at times to understand  Memory/Recall Ability Memory/Recall Ability : Current season;Location of own room;Staff names and faces;That he or she is in a hospital/hospital unit    Bedside Swallowing Assessment General Date of Onset: 01/08/22 Previous Swallow Assessment: BSE completed on 01/08/2022 Diet Prior to this Study: Regular;Thin liquids Temperature Spikes Noted: No Respiratory Status: Room air History of Recent Intubation: Yes Length of Intubations (days): 2 days Date extubated: 01/08/22 Behavior/Cognition: Alert;Cooperative Oral Cavity - Dentition: Adequate natural dentition Self-Feeding Abilities: Able to feed self;Needs set up Vision: Functional for self-feeding Patient Positioning: Upright in bed Baseline Vocal Quality: Normal Volitional Cough: Strong Volitional Swallow: Able to elicit   Ice Chips Ice chips: Not tested Thin Liquid Thin Liquid: Within functional limits Presentation: Straw Nectar Thick Nectar Thick Liquid: Not tested Honey Thick Honey Thick Liquid: Not tested Puree Puree: Not  tested Solid Solid: Within functional limits Presentation: Self Fed BSE Assessment Suspected Esophageal Findings Suspected Esophageal Findings:  (N/A) Risk for Aspiration Impact on safety and function: No limitations  Short Term Goals: Week 1: SLP Short Term Goal 1 (Week 1): Pt will complete generative and convergent naming tasks with 80% accuracy given Min A. SLP Short Term Goal 2 (Week 1): Pt will utilize compensatory memory strategies (internal and external aids) to recall information from daily events and restorative tasks x 4 with Min A. SLP Short Term Goal 3 (Week 1): Pt will participate in mildly-complex iADL tasks targeting medication and financial skills with 80% accuracy given Min A. SLP Short Term Goal 4 (Week 1): Pt will sustain attention to functional tasks for up to 20 minutes with less than 3 breaks needed given Sup A.  Refer to Care Plan for Long  Term Goals  Recommendations for other services: Neuropsych  Discharge Criteria: Patient will be discharged from SLP if patient refuses treatment 3 consecutive times without medical reason, if treatment goals not met, if there is a change in medical status, if patient makes no progress towards goals or if patient is discharged from hospital.  The above assessment, treatment plan, treatment alternatives and goals were discussed and mutually agreed upon: by patient and by family  Romelle Starcher A Milburn Freeney 01/22/2022, 12:08 PM

## 2022-01-22 NOTE — Progress Notes (Signed)
Patient restless at beginning of shift, resting well in second half of shift. Continues with frequent reports of headache when awake. Prn and scheduled meds given as indicated. Impulsive, easily redirected. Mom at bedside, bed alarm activated. VSS, no distress noted.

## 2022-01-22 NOTE — Plan of Care (Signed)
  Problem: Consults Goal: RH BRAIN INJURY PATIENT EDUCATION Description: Description: See Patient Education module for eduction specifics Outcome: Progressing   Problem: RH SKIN INTEGRITY Goal: RH STG MAINTAIN SKIN INTEGRITY WITH ASSISTANCE Description: STG Maintain Skin Integrity With Mod I Assistance. Outcome: Progressing   Problem: RH SAFETY Goal: RH STG ADHERE TO SAFETY PRECAUTIONS W/ASSISTANCE/DEVICE Description: STG Adhere to Safety Precautions With Cues and reminders. Outcome: Not Progressing Goal: RH STG DECREASED RISK OF FALL WITH ASSISTANCE Description: STG Decreased Risk of Fall With Mod I Assistance. Outcome: Progressing

## 2022-01-22 NOTE — Progress Notes (Signed)
Pt continuing to complain of severe headaches despite medications given. Still continuing to reduce stimuli in room between therapies, give support and giving PRN medication when needed. Pts mother who is at bedside asked if there were any other medications for pt to have to help with pain.   Rayburn Ma

## 2022-01-22 NOTE — Plan of Care (Signed)
  Problem: RH Expression Communication Goal: LTG Patient will increase word finding of common (SLP) Description: LTG:  Patient will increase word finding of common objects/daily info/abstract thoughts with cues using compensatory strategies (SLP). Flowsheets (Taken 01/22/2022 1237) LTG: Patient will increase word finding of common (SLP): Minimal Assistance - Patient > 75% Patient will use compensatory strategies to increase word finding of: Abstract thoughts   Problem: RH Problem Solving Goal: LTG Patient will demonstrate problem solving for (SLP) Description: LTG:  Patient will demonstrate problem solving for basic/complex daily situations with cues  (SLP) Flowsheets (Taken 01/22/2022 1237) LTG: Patient will demonstrate problem solving for (SLP): Basic daily situations LTG Patient will demonstrate problem solving for: Minimal Assistance - Patient > 75%   Problem: RH Memory Goal: LTG Patient will follow step by step directions w/cues (SLP) Description: LTG: Patient will follow step by step directions with cues (SLP). Flowsheets (Taken 01/22/2022 1237) LTG: Patient will follow step by step directions: 3 steps LTG: Patient will follow step by step directions w/cues: Supervision Goal: LTG Patient will demonstrate ability for day to day (SLP) Description: LTG:   Patient will demonstrate ability for day to day recall/carryover during cognitive/linguistic activities with assist  (SLP) Flowsheets (Taken 01/22/2022 1237) LTG: Patient will demonstrate ability for day to day recall: New information LTG: Patient will demonstrate ability for day to day recall/carryover during cognitive/linguistic activities with assist (SLP): Minimal Assistance - Patient > 75% Goal: LTG Patient will use memory compensatory aids to (SLP) Description: LTG:  Patient will use memory compensatory aids to recall biographical/new, daily complex information with cues (SLP) Flowsheets (Taken 01/22/2022 1237) LTG: Patient will use  memory compensatory aids to (SLP): Supervision   Problem: RH Attention Goal: LTG Patient will demonstrate this level of attention during functional activites (SLP) Description: LTG:  Patient will will demonstrate this level of attention during functional activites (SLP) Flowsheets (Taken 01/22/2022 1237) Patient will demonstrate during cognitive/linguistic activities the attention type of: Sustained Patient will demonstrate this level of attention during cognitive/linguistic activities in: Controlled LTG: Patient will demonstrate this level of attention during cognitive/linguistic activities with assistance of (SLP): Supervision Number of minutes patient will demonstrate attention during cognitive/linguistic activities: 30 minutes   Problem: RH Awareness Goal: LTG: Patient will demonstrate awareness during functional activites type of (SLP) Description: LTG: Patient will demonstrate awareness during functional activites type of (SLP) Flowsheets (Taken 01/22/2022 1237) Patient will demonstrate during cognitive/linguistic activities awareness type of:  Intellectual  Emergent LTG: Patient will demonstrate awareness during cognitive/linguistic activities with assistance of (SLP): Minimal Assistance - Patient > 75%

## 2022-01-22 NOTE — Progress Notes (Addendum)
PROGRESS NOTE   Subjective/Complaints: Still with headaches. Some restless last night as a result. Nurse reported pt is having back spasms. Uses heat at home  ROS: Patient denies fever, rash, sore throat, blurred vision, dizziness, nausea, vomiting, diarrhea, cough, shortness of breath or chest pain, joint   pain,  .    Objective:   VAS [place] TRANSCRANIAL DOPPLER  Result Date: 01/20/2022  Transcranial Doppler Patient Name:  Judy Walsh  Date of Exam:   01/20/2022 Medical Rec #: [id number]        Accession #:    [id number] Date of Birth: 18-Aug-1975        Patient Gender: F Patient Age:   46 years Exam Location:  [facility] Procedure:      VAS [place] TRANSCRANIAL DOPPLER Referring Phys: Verlin Dike --------------------------------------------------------------------------------  Indications: Subarachnoid hemorrhage. History: S/p coil embolization Acom/left Pcom aneurysms. Comparison Study: 01/17/2022 TCD- mild bilateral MCA vasospasm Performing Technologist: Gertie Fey MHA, RDMS, RVT, RDCS  Examination Guidelines: A complete evaluation includes B-mode imaging, spectral Doppler, color Doppler, and power Doppler as needed of all accessible portions of each vessel. Bilateral testing is considered an integral part of a complete examination. Limited examinations for reoccurring indications may be performed as noted.  +----------+-------------+----------+-----------+-------+ RIGHT TCD Right VM (cm)Depth (cm)PulsatilityComment +----------+-------------+----------+-----------+-------+ MCA          103.00       5.50      0.53            +----------+-------------+----------+-----------+-------+ ACA          -70.00                 0.55            +----------+-------------+----------+-----------+-------+ Term ICA      83.00                 1.06            +----------+-------------+----------+-----------+-------+ PCA            23.00                 0.57            +----------+-------------+----------+-----------+-------+ Opthalmic     19.00                 1.21            +----------+-------------+----------+-----------+-------+ ICA siphon    27.00                 1.36            +----------+-------------+----------+-----------+-------+ Vertebral    -37.00                 0.58            +----------+-------------+----------+-----------+-------+ Distal ICA    38.00                                 +----------+-------------+----------+-----------+-------+  +----------+------------+----------+-----------+-------+ LEFT TCD  Left VM (cm)Depth (cm)PulsatilityComment +----------+------------+----------+-----------+-------+ MCA          129.00  4.60      0.58            +----------+------------+----------+-----------+-------+ ACA          -18.00                0.93            +----------+------------+----------+-----------+-------+ Term ICA     76.00                 0.78            +----------+------------+----------+-----------+-------+ PCA          33.00                 0.98            +----------+------------+----------+-----------+-------+ Opthalmic    12.00                 1.38            +----------+------------+----------+-----------+-------+ ICA siphon   29.00                 0.96            +----------+------------+----------+-----------+-------+ Vertebral    -31.00                1.04            +----------+------------+----------+-----------+-------+ Distal ICA   19.00                                 +----------+------------+----------+-----------+-------+  +------------+------+-------+             VM cm Comment +------------+------+-------+ Prox Basilar-33.00        +------------+------+-------+ +----------------------+----+ Right Lindegaard Ratio2.71 +----------------------+----+ +---------------------+----+ Left Lindegaard  Ratio6.79 +---------------------+----+    Preliminary    Recent Labs    01/22/22 0527  WBC 5.0  HGB 14.4  HCT 41.6  PLT 373   Recent Labs    01/22/22 0527  NA 136  K 4.1  CL 100  CO2 21*  GLUCOSE 111*  BUN 11  CREATININE 0.67  CALCIUM 9.7    Intake/Output Summary (Last 24 hours) at 01/22/2022 0843 Last data filed at 01/22/2022 0836 Gross per 24 hour  Intake 530 ml  Output --  Net 530 ml        Physical Exam: Vital Signs Blood pressure 104/84, pulse 85, temperature 98.8 F (37.1 C), temperature source Oral, resp. rate 16, height 5\' 8"  (1.727 m), SpO2 98 %.  General: Alert and oriented x 3, No apparent distress HEENT: Head is normocephalic, atraumatic, PERRLA, EOMI, sclera anicteric, oral mucosa pink and moist, dentition intact, ext ear canals clear,  Neck: Supple without JVD or lymphadenopathy Heart: Reg rate and rhythm. No murmurs rubs or gallops Chest: CTA bilaterally without wheezes, rales, or rhonchi; no distress Abdomen: Soft, non-tender, non-distended, bowel sounds positive. Extremities: No clubbing, cyanosis, or edema. Pulses are 2+ Psych: Pt's affect is appropriate. Pt is cooperative Skin: Clean and intact without signs of breakdown Neuro:  Pt is alert. Oriented. Mildly distracted. Fair awareness and memory. Motor 4-5/5. No focal sensory deficits Musculoskeletal: low back pain    Assessment/Plan: 1. Functional deficits which require 3+ hours per day of interdisciplinary therapy in a comprehensive inpatient rehab setting. Physiatrist is providing close team supervision and 24 hour management of active medical problems listed below. Physiatrist and rehab team continue to assess barriers to discharge/monitor patient progress toward  functional and medical goals  Care Tool:  Bathing              Bathing assist Assist Level: Independent     Upper Body Dressing/Undressing Upper body dressing   What is the patient wearing?: Bra, Pull over shirt     Upper body assist      Lower Body Dressing/Undressing Lower body dressing            Lower body assist       Toileting Toileting Toileting Activity did not occur (Clothing management and hygiene only):  (pt used bathroom)  Toileting assist Assist for toileting: Independent     Transfers Chair/bed transfer  Transfers assist     Chair/bed transfer assist level: Independent     Locomotion Ambulation   Ambulation assist      Assist level: Supervision/Verbal cueing Assistive device: Walker-rolling Max distance: 10   Walk 10 feet activity   Assist     Assist level: Independent Assistive device: Walker-rolling   Walk 50 feet activity   Assist           Walk 150 feet activity   Assist           Walk 10 feet on uneven surface  activity   Assist           Wheelchair     Assist               Wheelchair 50 feet with 2 turns activity    Assist            Wheelchair 150 feet activity     Assist          Blood pressure 104/84, pulse 85, temperature 98.8 F (37.1 C), temperature source Oral, resp. rate 16, height 5\' 8"  (1.727 m), SpO2 98 %.  Medical Problem List and Plan: 1. 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  Status post coil embolization 01/07/2022 per Dr. 01/09/2022.             -patient may shower             -ELOS/Goals: 7 days, mod I to supervision goals with PT, OT, SLP             -Patient is beginning CIR therapies today including PT and OT  2.  Antithrombotics: -DVT/anticoagulation:  Pharmaceutical: Lovenox initiated 01/14/2022             -antiplatelet therapy: N/A 3. Pain Management: Neurontin 200 mg every 8 hours, Robaxin as needed, Advil 400 mg every 6 hours as needed headache             -added topamax 25mg  bid on admit. If helpful will wean off gabapentin--continue 200mg  bid for now  -ordered kpad for low back 4.  Mood/Sleep: Melatonin 5 mg nightly.  Provide emotional support             -antipsychotic agents: N/A 5. Neuropsych/cognition: This patient is capable of making decisions on her own behalf. 6. Skin/Wound Care: Routine skin checks, staples out soon 7. Fluids/Electrolytes/Nutrition: encourage PO   I personally reviewed all of the patient's labs today, and lab work is within normal limits.  8.  Seizure prophylaxis.  Keppra 500 mg twice daily 9.  Hypertension.Nimotop protocol             -bp well controlled at present 10.  Myopericarditis.  Follow-up outpatient Dr. 01/16/2022             -  pt seems currently asymptomatic    LOS: 1 days A FACE TO FACE EVALUATION WAS PERFORMED  Ranelle Oyster 01/22/2022, 8:43 AM

## 2022-01-22 NOTE — Evaluation (Signed)
Physical Therapy Assessment and Plan  Patient Details  Name: Judy Walsh MRN: [id number] Date of Birth: 1976-03-19  PT Diagnosis: Abnormality of gait, Ataxic gait, Cognitive deficits, Difficulty walking, Edema, and Pain in head Rehab Potential: Excellent ELOS: 5-7 days   Today's Date: 01/22/2022 PT Individual Time: 1345-1435 PT Individual Time Calculation (min): 50 min    Hospital Problem: Principal Problem:   Subarachnoid hemorrhage ([place])   Past Medical History:  Past Medical History:  Diagnosis Date   Chest x-ray abnormality 06/09/2021   CXR 11/22: Vague nodular opacities in the mid left lung and right upper lobe which may be due to overlapping structures. Comparison with older imaging studies or follow-up is recommended to exclude pulmonary nodule.    Myopericarditis 06/08/2021   admx 11/22 >> hsTrop mildly elevated w/o trend; BNP and CRP high - trending down at DC // Echocardiogram 11/22: no effusion, EF 55-60, no RWMA, mild LVH >> NSAIDs/Colchicine   Past Surgical History:  Past Surgical History:  Procedure Laterality Date   IR ANGIO INTRA EXTRACRAN SEL INTERNAL CAROTID BILAT MOD SED  01/07/2022   IR ANGIO VERTEBRAL SEL VERTEBRAL UNI R MOD SED  01/08/2022   IR ANGIOGRAM FOLLOW UP STUDY  01/07/2022   IR ANGIOGRAM FOLLOW UP STUDY  01/07/2022   IR ANGIOGRAM FOLLOW UP STUDY  01/07/2022   IR ANGIOGRAM FOLLOW UP STUDY  01/07/2022   IR NEURO EACH ADD'L AFTER BASIC UNI LEFT (MS)  01/08/2022   IR NEURO EACH ADD'L AFTER BASIC UNI RIGHT (MS)  01/08/2022   IR TRANSCATH/EMBOLIZ  01/07/2022   RADIOLOGY WITH ANESTHESIA N/A 01/07/2022   Procedure: IR WITH ANESTHESIA;  Surgeon: Consuella Lose, MD;  Location: [facility];  Service: Radiology;  Laterality: N/A;    Assessment & Plan Clinical Impression: Patient is a 46 y.o. year old right-handed female with history of myopericarditis with admission 11/22 and ejection fraction on echocardiogram of 55 to 60% no wall motion abnormalities initially  maintained on colchicine and followed by Dr. Lyman Bishop..  Per chart review patient lives with her 69 year old daughter.  Two-level home bed and bath main level 2 steps to enter.  Independent prior to admission working full-time.  Presented 01/06/2022 with severe headache as well as nausea and vomiting.  Cranial CT scan showed moderate volume of acute subarachnoid hemorrhage along the interhemispheric falx, suprasellar cistern, sylvian fissures and basal cisterns.  No evidence of midline shift.  Borderline ventriculomegaly.  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  No emergent large vessel occlusion or proximal hemodynamically significant stenosis.  Admission chemistries unremarkable except potassium 2.7 glucose 128, alcohol and urine drug screen negative.  Patient underwent diagnostic cerebral angiogram 01/07/2022 by Dr. 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  Patient maintained on Ninotop per protocol.  Keppra ongoing for seizure prophylaxis.  Patient did initially require short-term intubation.  Diet has been advanced to regular consistency.  Patient was cleared to begin Lovenox for DVT prophylaxis 6 01/14/2022.Marland Kitchen  Therapy evaluations completed due to patient decreased functional mobility was admitted for a comprehensive rehab program. Patient transferred to CIR on 01/21/2022 .   Patient currently requires supervision with mobility secondary to decreased cardiorespiratoy endurance, ataxia and decreased coordination, decreased visual acuity and decreased visual motor skills, decreased attention, decreased awareness, decreased problem solving, decreased safety  awareness, decreased memory, and delayed processing, and decreased standing balance, decreased  postural control, and decreased balance strategies.  Prior to hospitalization, patient was independent  with mobility and lived with Daughter (55 year old daughter, mother and father available as well) in a House home.  Home access is 2Stairs to enter.  Patient will benefit from skilled PT intervention to maximize safe functional mobility, minimize fall risk, and decrease caregiver burden for planned discharge home with 24 hour supervision.  Anticipate patient will benefit from follow up OP at discharge.  PT - End of Session Activity Tolerance: Tolerates 10 - 20 min activity with multiple rests Endurance Deficit: Yes Endurance Deficit Description: poor tolerance with activity due to headache and nausea with mobility, required frequent rest breaks, terminated session due to fatigue PT Assessment Rehab Potential (ACUTE/IP ONLY): Excellent PT Barriers to Discharge: Home environment access/layout;Decreased caregiver support PT Barriers to Discharge Comments: Patient reports that she would like to go home with her 21 year old daughter, patient's mother reports she would prefer the patient to d/c to her residence for 24/7 supervision from hre parents PT Patient demonstrates impairments in the following area(s): Balance;Pain;Behavior;Edema;Safety;Endurance;Skin Integrity PT Transfers Functional Problem(s): Bed Mobility;Bed to Chair;Car;Furniture;Floor PT Locomotion Functional Problem(s): Ambulation;Stairs PT Plan PT Intensity: Minimum of 1-2 x/day ,45 to 90 minutes PT Frequency: 5 out of 7 days PT Duration Estimated Length of Stay: 5-7 days PT Treatment/Interventions: Ambulation/gait training;Cognitive remediation/compensation;Discharge planning;DME/adaptive equipment instruction;Functional mobility training;Pain management;Psychosocial support;Splinting/orthotics;Therapeutic Activities;UE/LE Strength taining/ROM;Visual/perceptual remediation/compensation;UE/LE Coordination activities;Therapeutic  Exercise;Stair training;Skin care/wound management;Patient/family education;Neuromuscular re-education;Functional electrical stimulation;Disease management/prevention;Community reintegration;Balance/vestibular training PT Transfers Anticipated Outcome(s): Independent PT Locomotion Anticipated Outcome(s): Independent household, supervision community due to attention deficits PT Recommendation Recommendations for Other Services: Neuropsych consult Follow Up Recommendations: Outpatient PT Patient destination: Home Equipment Recommended: None recommended by PT   PT Evaluation Precautions/Restrictions Precautions Precautions: Fall Precaution Comments: SBP goal 130-150 Restrictions Weight Bearing Restrictions: No General PT Amount of Missed Time (min): 10 Minutes PT Missed Treatment Reason: Pain;Patient fatigue Vital Signs Pain Interference Pain Interference Pain Effect on Sleep: 3. Frequently Pain Interference with Therapy Activities: 4. Almost constantly Pain Interference with Day-to-Day Activities: 3. Frequently Home Living/Prior Functioning Home Living Available Help at Discharge: Available 24 hours/day;Family Type of Home: House Home Access: Stairs to enter [employer] of Steps: 2 Entrance Stairs-Rails: None Home Layout: Two level;Able to live on main level with bedroom/bathroom Alternate Level Stairs-Number of Steps: flight Bathroom Shower/Tub: Multimedia programmer: Standard  Lives With: Daughter (86 year old daughter, mother and father available as well) Prior Function Level of Independence: Independent with basic ADLs;Independent with homemaking with wheelchair;Independent with transfers;Independent with homemaking with ambulation;Independent with gait  Able to Take Stairs?: Yes Driving: Yes Vocation: Full time employment Leisure: Hobbies-yes (Comment) Vision/Perception  Vision - History Ability to See in Adequate Light: 0 Adequate Vision -  Assessment Eye Alignment: Within Functional Limits (pupils remained dilated throughout testing) Ocular Range of Motion: Within Functional Limits Perception Perception: Within Functional Limits Praxis Praxis: Intact  Cognition Overall Cognitive Status: Impaired/Different from baseline Arousal/Alertness: Awake/alert (severe headache at evaluation) Orientation Level: Oriented X4 Year: 2023 Month: June Day of Week: Incorrect Attention: Sustained;Focused Focused Attention: Impaired Focused Attention Impairment: Verbal complex Sustained Attention: Impaired Sustained Attention Impairment: Verbal complex;Functional complex;Functional basic Selective Attention: Impaired Selective Attention Impairment: Verbal basic;Functional basic Memory: Impaired Memory Impairment: Retrieval deficit;Decreased recall of new information;Decreased short term memory Decreased Short Term Memory: Verbal basic Awareness: Impaired Awareness Impairment: Emergent impairment Problem Solving: Impaired Problem Solving Impairment:  Verbal basic;Verbal complex Executive Function: Organizing;Self Correcting Organizing: Impaired Organizing Impairment: Verbal basic Self Correcting: Impaired Self Correcting Impairment: Verbal complex;Verbal basic Behaviors: Impulsive Safety/Judgment: Impaired Sensation Sensation Light Touch: Appears Intact Hot/Cold: Appears Intact Proprioception: Appears Intact Coordination Gross Motor Movements are Fluid and Coordinated: Yes Fine Motor Movements are Fluid and Coordinated: Yes Motor  Motor Motor: Within Functional Limits   Trunk/Postural Assessment  Cervical Assessment Cervical Assessment: Within Functional Limits Thoracic Assessment Thoracic Assessment: Within Functional Limits Lumbar Assessment Lumbar Assessment: Within Functional Limits Postural Control Postural Control: Deficits on evaluation (delayed)  Balance Balance Balance Assessed: Yes Static Sitting  Balance Static Sitting - Balance Support: Feet supported;No upper extremity supported Static Sitting - Level of Assistance: 7: Independent Dynamic Sitting Balance Dynamic Sitting - Balance Support: No upper extremity supported;Feet supported Dynamic Sitting - Level of Assistance: 5: Stand by assistance Dynamic Sitting - Balance Activities: Reaching across midline;Reaching for objects Static Standing Balance Static Standing - Balance Support: No upper extremity supported;During functional activity Static Standing - Level of Assistance: 7: Independent Dynamic Standing Balance Dynamic Standing - Balance Support: During functional activity;No upper extremity supported Dynamic Standing - Level of Assistance: 5: Stand by assistance Dynamic Standing - Balance Activities: Lateral lean/weight shifting;Forward lean/weight shifting;Reaching across midline;Reaching for objects Dynamic Standing - Comments: stood to dress UB clothing, wash her body in the shower, and complete oral care with (S) Extremity Assessment  RUE Assessment RUE Assessment: Within Functional Limits General Strength Comments: 5/5 elbow flexion, shoulder flexion/abduction, grip pinch intact LUE Assessment LUE Assessment: Within Functional Limits General Strength Comments: 5/5 elbow flexion, shoulder flexion/abduction, grip pinch intact RLE Assessment RLE Assessment: Within Functional Limits General Strength Comments: Grossly 5/5 throughout in sitting LLE Assessment LLE Assessment: Within Functional Limits General Strength Comments: Grossly 5/5 throughout in sitting  Care Tool Care Tool Bed Mobility Roll left and right activity   Roll left and right assist level: Independent    Sit to lying activity   Sit to lying assist level: Independent    Lying to sitting on side of bed activity   Lying to sitting on side of bed assist level: the ability to move from lying on the back to sitting on the side of the bed with no back  support.: Independent     Care Tool Transfers Sit to stand transfer   Sit to stand assist level: Supervision/Verbal cueing    Chair/bed transfer   Chair/bed transfer assist level: Supervision/Verbal cueing     Toilet transfer   Assist Level: Supervision/Verbal cueing    Car transfer   Car transfer assist level: Supervision/Verbal cueing      Care Tool Locomotion Ambulation   Assist level: Supervision/Verbal cueing Assistive device: No Device Max distance: >150 ft  Walk 10 feet activity   Assist level: Supervision/Verbal cueing Assistive device: No Device   Walk 50 feet with 2 turns activity   Assist level: Supervision/Verbal cueing Assistive device: No Device  Walk 150 feet activity   Assist level: Supervision/Verbal cueing Assistive device: No Device  Walk 10 feet on uneven surfaces activity   Assist level: Supervision/Verbal cueing    Stairs   Assist level: Supervision/Verbal cueing Stairs assistive device: No device Max number of stairs: 12  Walk up/down 1 step activity   Walk up/down 1 step (curb) assist level: Supervision/Verbal cueing Walk up/down 1 step or curb assistive device: No device  Walk up/down 4 steps activity   Walk up/down 4 steps assist level: Supervision/Verbal cueing Walk up/down 4 steps assistive  device: No device  Walk up/down 12 steps activity   Walk up/down 12 steps assist level: Supervision/Verbal cueing Walk up/down 12 steps assistive device: No device  Pick up small objects from floor   Pick up small object from the floor assist level: Supervision/Verbal cueing    Wheelchair Is the patient using a wheelchair?: No          Wheel 50 feet with 2 turns activity      Wheel 150 feet activity        Refer to Care Plan for Long Term Goals  SHORT TERM GOAL WEEK 1 PT Short Term Goal 1 (Week 1): STG=LTG due to ELOS.  Recommendations for other services: Neuropsych  Skilled Therapeutic Intervention Evaluation completed (see details  above and below) with education on PT POC and goals and individual treatment initiated with focus on functional mobility/transfers, LE strength, dynamic standing balance/coordination, ambulation, stair navigation, simulated car transfers, and improved endurance with activity.  Patient in bed asleep with her parents in the room upon PT arrival. Patient alert and agreeable to PT session. Patient reported 8-9/10 headache pain with intermittent nausea with activity during session, RN made aware. PT provided repositioning, rest breaks, and distraction as pain interventions throughout session.   Vitals: WFL with intermittent monitoring throughout session  Therapeutic Activity: Bed Mobility: Patient performed rolling R/L and supine to/from sit independently in a flat bed without use of bed rails.  Transfers: Patient performed sit to/from stand from a hospital bed, toilet, and mat table with supervision-independently without AD. Provided verbal cues for impulsivity x1. Patient performed ambulatory transfers to/from the bathroom and sink x2 with supervision for safety. She was continent of bowel and bladder during session. Performed peri-care, lower body clothing management, and hand hygiene independently with distant supervision for balance/safety.  Patient performed a simulated sedan height car transfer with supervision using step-in technique holding onto car frame without LOB. Provided cues for safe technique.  Gait Training:  Patient ambulated >150 feet and >100 feet x2 without an AD with supervision. Ambulated with decreased gait speed, variable foot placement, decreased attention to task with both external and internal distractions throughout. Provided verbal cues for attention to task and increased gait speed as tolerated. Distance limited by increased headache pain and fatigue this afternoon. Patient ascending/descended 12x6" steps without rails with supervision. Performed reciprocal gait pattern  throughout. Provided cues for technique and sequencing.  Patient ambulated up/down a ramp, over 10 feet of mulch (unlevel surface), and up/down a curb to simulate community ambulation over unlevel surfaces with supervision without an AD. Provided cues for technique and use of AD.  Patient reported increased fatigue and requested to lie down to rest and end session early. Patient missed 10 min of skilled PT due to pain/fatiue, RN made aware. Will attempt to make-up missed time as able.    Instructed pt in results of PT evaluation as detailed above, PT POC, rehab potential, rehab goals, and discharge recommendations. Additionally discussed CIR's policies regarding fall safety and use of chair alarm and/or quick release belt. Pt verbalized understanding and in agreement. Will update pt's family members as they become available.   Patient in bed with the lights off for headache management at end of session with breaks locked, bed alarm set, and all needs within reach.   Discharge Criteria: Patient will be discharged from PT if patient refuses treatment 3 consecutive times without medical reason, if treatment goals not met, if there is a change in medical status, if  patient makes no progress towards goals or if patient is discharged from hospital.  The above assessment, treatment plan, treatment alternatives and goals were discussed and mutually agreed upon: by patient  Doreene Burke PT, DPT  01/22/2022, 2:53 PM

## 2022-01-22 NOTE — Progress Notes (Signed)
Inpatient Rehabilitation  Patient information reviewed and entered into e[facility] system by Aditri Louischarles Alexes Lamarque, OTR/L.   Information including medical coding, functional ability and quality indicators will be reviewed and updated through discharge.    

## 2022-01-22 NOTE — Evaluation (Signed)
Occupational Therapy Assessment and Plan  Patient Details  Name: Judy Walsh MRN: [id number] Date of Birth: 11-14-1975  OT Diagnosis: acute pain and cognitive deficits Rehab Potential: Rehab Potential (ACUTE ONLY): Excellent ELOS: 5-7 days   Today's Date: 01/22/2022 OT Individual Time: 1000-1100 OT Individual Time Calculation (min): 60 min     Hospital Problem: Principal Problem:   Subarachnoid hemorrhage ([place])   Past Medical History:  Past Medical History:  Diagnosis Date   Chest x-ray abnormality 06/09/2021   CXR 11/22: Vague nodular opacities in the mid left lung and right upper lobe which may be due to overlapping structures. Comparison with older imaging studies or follow-up is recommended to exclude pulmonary nodule.    Myopericarditis 06/08/2021   admx 11/22 >> hsTrop mildly elevated w/o trend; BNP and CRP high - trending down at DC // Echocardiogram 11/22: no effusion, EF 55-60, no RWMA, mild LVH >> NSAIDs/Colchicine   Past Surgical History:  Past Surgical History:  Procedure Laterality Date   IR ANGIO INTRA EXTRACRAN SEL INTERNAL CAROTID BILAT MOD SED  01/07/2022   IR ANGIO VERTEBRAL SEL VERTEBRAL UNI R MOD SED  01/08/2022   IR ANGIOGRAM FOLLOW UP STUDY  01/07/2022   IR ANGIOGRAM FOLLOW UP STUDY  01/07/2022   IR ANGIOGRAM FOLLOW UP STUDY  01/07/2022   IR ANGIOGRAM FOLLOW UP STUDY  01/07/2022   IR NEURO EACH ADD'L AFTER BASIC UNI LEFT (MS)  01/08/2022   IR NEURO EACH ADD'L AFTER BASIC UNI RIGHT (MS)  01/08/2022   IR TRANSCATH/EMBOLIZ  01/07/2022   RADIOLOGY WITH ANESTHESIA N/A 01/07/2022   Procedure: IR WITH ANESTHESIA;  Surgeon: Consuella Lose, MD;  Location: [facility];  Service: Radiology;  Laterality: N/A;    Assessment & Plan Clinical Impression: Patient is a 46 y.o. year old female with recent admission to the hospital. Patient transferred to CIR on 01/21/2022 .    Patient currently requires supervision with basic self-care skills secondary to decreased attention,  decreased awareness, decreased problem solving, decreased safety awareness, decreased memory, and delayed processing and decreased standing balance.  Prior to hospitalization, patient could complete all ADLs/IADLs independent .  Patient will benefit from skilled intervention to increase independence with basic self-care skills and increase level of independence with iADL prior to discharge home with care partner.  Anticipate patient will require 24 hour supervision and  to be determined .  OT - End of Session Activity Tolerance: Tolerates 30+ min activity with multiple rests Endurance Deficit: Yes OT Assessment Rehab Potential (ACUTE ONLY): Excellent OT Barriers to Discharge: Decreased caregiver support;Home environment access/layout OT Barriers to Discharge Comments: cogniton will likely be a barrier to safety at home OT Patient demonstrates impairments in the following area(s): Safety;Cognition;Endurance;Pain;Balance;Motor OT Basic ADL's Functional Problem(s): Dressing;Toileting OT Advanced ADL's Functional Problem(s): Full Meal Preparation;Laundry OT Transfers Functional Problem(s): Tub/Shower;Toilet OT Additional Impairment(s): None OT Plan OT Intensity: Minimum of 1-2 x/day, 45 to 90 minutes OT Frequency: 5 out of 7 days OT Duration/Estimated Length of Stay: 5-7 days OT Treatment/Interventions: Balance/vestibular training;Discharge planning;Pain management;Self Care/advanced ADL retraining;Therapeutic Activities;UE/LE Coordination activities;Visual/perceptual remediation/compensation;Therapeutic Exercise;Patient/family education;Functional mobility training;Disease mangement/prevention;Cognitive remediation/compensation;Community reintegration;DME/adaptive equipment instruction;Psychosocial support;UE/LE Strength taining/ROM;Skin care/wound managment OT Self Feeding Anticipated Outcome(s): Independent OT Basic Self-Care Anticipated Outcome(s): Independent OT Toileting Anticipated  Outcome(s): Mod I OT Bathroom Transfers Anticipated Outcome(s): Mod I OT Recommendation Patient destination: Home Follow Up Recommendations: Other (comment) (to be determined depending on support available) Equipment Details: to be determined   OT Evaluation Precautions/Restrictions  Precautions Precautions: Fall Precaution  Comments: SBP goal 130-150 Restrictions Weight Bearing Restrictions: No General Chart Reviewed: Yes Additional Pertinent History: presented 6/12 with severe headache with nausea and vomiting, found to have subarachnoid hemorrhage with eventual development of cerebral edema requiring emergent placement of EVD. 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 PMHx: myopericarditis. Response to Previous Treatment: Patient reporting fatigue but able to participate Family/Caregiver Present: Yes   Pain Pain Assessment Pain Scale: 0-10 Pain Score: 5  Pain Type: Acute pain Pain Location: Head Pain Orientation: Anterior Pain Descriptors / Indicators: Aching Pain Onset: On-going Patients Stated Pain Goal: 5 Pain Intervention(s): Emotional support;Rest;Shower;Relaxation Multiple Pain Sites: Yes 2nd Pain Site Pain Score: 3 Pain Type: Acute pain Pain Location: Buttocks Pain Orientation: Lower Pain Frequency: Occasional Pain Onset: On-going Pain Intervention(s): Emotional support;Shower;Repositioned Home Living/Prior Functioning Home Living Family/patient expects to be discharged to:: Private residence Living Arrangements: Parent, Children Available Help at Discharge: Available 24 hours/day, Family Type of Home: House Home Access: Stairs to enter Technical brewer of Steps: 2 Entrance Stairs-Rails: None Home Layout: Two level, Able to live on main level with bedroom/bathroom Alternate Level Stairs-Number of Steps: flight Bathroom Shower/Tub: Multimedia programmer:  Standard  Lives With: Daughter IADL History Homemaking Responsibilities: Yes Meal Prep Responsibility: Therapist, occupational Responsibility: Primary Cleaning Responsibility: Primary Bill Paying/Finance Responsibility: Primary Shopping Responsibility: Primary Child Care Responsibility: Primary Homemaking Comments: lives at home with her teenage daughter and fully responisbly for all ADL/IADL tasks prior to hospitalization Current License: Yes Mode of Transportation: Musician Education: Dietitian Occupation: Full time employment 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, reading, Community education officer Prior Function Level of Independence: Independent with basic ADLs, Independent with homemaking with wheelchair, Independent with transfers, Independent with homemaking with ambulation, Independent with gait  Able to Take Stairs?: Yes Driving: Yes Vocation: Full time employment Leisure: Hobbies-yes (Comment) Vision Baseline Vision/History: 0 No visual deficits Ability to See in Adequate Light: 0 Adequate Patient Visual Report: Blurring of vision (pt mentioned she was about to schedule an eye appointment before coming d/t blurry vision/difficulty reading) Vision Assessment?: Vision impaired- to be further tested in functional context Perception  Perception: Within Functional Limits Praxis Praxis: Intact Cognition Cognition Overall Cognitive Status: Impaired/Different from baseline Arousal/Alertness: Awake/alert (severe headache at evaluation) Orientation Level: Person;Place;Situation Person: Oriented Place: Oriented Situation: Oriented Memory: Impaired Memory Impairment: Retrieval deficit;Decreased recall of new information;Decreased short term memory Decreased Short Term Memory: Verbal basic Attention: Sustained;Selective Focused Attention: Impaired Focused Attention Impairment: Verbal complex Sustained Attention: Impaired Sustained  Attention Impairment: Verbal complex;Functional complex;Functional basic Selective Attention: Impaired Selective Attention Impairment: Verbal basic;Functional basic Awareness: Impaired Awareness Impairment: Emergent impairment Problem Solving: Impaired Problem Solving Impairment: Verbal basic;Verbal complex Executive Function: Organizing;Self Correcting Organizing: Impaired Organizing Impairment: Verbal basic Self Correcting: Impaired Self Correcting Impairment: Verbal complex;Verbal basic Behaviors: Impulsive Safety/Judgment: Impaired Brief Interview for Mental Status (BIMS) Repetition of Three Words (First Attempt): 3 Temporal Orientation: Year: Correct Temporal Orientation: Month: Accurate within 5 days Temporal Orientation: Day: Correct Recall: "Sock": No, could not recall Recall: "Blue": Yes, after cueing ("a color") Recall: "Bed": No, could not recall BIMS Summary Score: 10 Sensation Sensation Light Touch: Appears Intact Hot/Cold: Appears Intact Proprioception: Appears Intact Coordination Gross Motor Movements are Fluid and Coordinated: Yes Fine Motor Movements are Fluid and Coordinated: Yes Motor  Motor Motor: Within Functional Limits  Trunk/Postural Assessment  Cervical Assessment Cervical Assessment: Within Functional Limits Thoracic Assessment Thoracic  Assessment: Within Functional Limits Lumbar Assessment Lumbar Assessment: Within Functional Limits Postural Control Postural Control: Deficits on evaluation (delayed)  Balance Balance Balance Assessed: Yes Static Sitting Balance Static Sitting - Balance Support: Feet supported;No upper extremity supported Static Sitting - Level of Assistance: 5: Stand by assistance Dynamic Sitting Balance Dynamic Sitting - Balance Support: No upper extremity supported;Feet supported Dynamic Sitting - Level of Assistance: 5: Stand by assistance Dynamic Sitting - Balance Activities: Reaching across midline;Reaching for  objects Static Standing Balance Static Standing - Balance Support: No upper extremity supported;During functional activity Static Standing - Level of Assistance: 5: Stand by assistance Dynamic Standing Balance Dynamic Standing - Balance Support: During functional activity;No upper extremity supported Dynamic Standing - Level of Assistance: 5: Stand by assistance Dynamic Standing - Balance Activities: Lateral lean/weight shifting;Forward lean/weight shifting;Reaching across midline;Reaching for objects Dynamic Standing - Comments: stood to dress UB clothing, wash her body in the shower, and complete oral care with (S) Extremity/Trunk Assessment RUE Assessment RUE Assessment: Within Functional Limits General Strength Comments: 5/5 elbow flexion, shoulder flexion/abduction, grip pinch intact LUE Assessment LUE Assessment: Within Functional Limits General Strength Comments: 5/5 elbow flexion, shoulder flexion/abduction, grip pinch intact  Care Tool Care Tool Self Care Eating   Eating Assist Level: Set up assist    Oral Care    Oral Care Assist Level: Set up assist    Bathing   Body parts bathed by patient: Right arm;Left upper leg;Right lower leg;Left arm;Chest;Left lower leg;Abdomen;Face;Front perineal area;Buttocks;Right upper leg     Assist Level: Supervision/Verbal cueing    Upper Body Dressing(including orthotics)   What is the patient wearing?: Pull over shirt   Assist Level: Supervision/Verbal cueing    Lower Body Dressing (excluding footwear)   What is the patient wearing?: Underwear/pull up;Pants Assist for lower body dressing: Contact Guard/Touching assist    Putting on/Taking off footwear   What is the patient wearing?: Non-skid slipper socks Assist for footwear: Set up assist       Care Tool Toileting Toileting activity   Assist for toileting: Supervision/Verbal cueing     Care Tool Bed Mobility Roll left and right activity   Roll left and right assist  level: Supervision/Verbal cueing    Sit to lying activity   Sit to lying assist level: Supervision/Verbal cueing    Lying to sitting on side of bed activity   Lying to sitting on side of bed assist level: the ability to move from lying on the back to sitting on the side of the bed with no back support.: Contact Guard/Touching assist     Care Tool Transfers Sit to stand transfer   Sit to stand assist level: Supervision/Verbal cueing    Chair/bed transfer   Chair/bed transfer assist level: Supervision/Verbal cueing     Toilet transfer   Assist Level: Supervision/Verbal cueing     Care Tool Cognition  Expression of Ideas and Wants Expression of Ideas and Wants: 3. Some difficulty - exhibits some difficulty with expressing needs and ideas (e.g, some words or finishing thoughts) or speech is not clear  Understanding Verbal and Non-Verbal Content Understanding Verbal and Non-Verbal Content: 3. Usually understands - understands most conversations, but misses some part/intent of message. Requires cues at times to understand   Memory/Recall Ability Memory/Recall Ability : Current season;Location of own room;Staff names and faces;That he or she is in a hospital/hospital unit   Refer to Care Plan for [place] 1 OT Short Term Goal  1 (Week 1): Pt will complete LB dressing with Mod I. OT Short Term Goal 2 (Week 1): Pt will maintain dynamic standing balance during funcitonal ADL with Mod I. OT Short Term Goal 3 (Week 1): Pt will increase memory recall with familiar tasks with min verbal cues. OT Short Term Goal 4 (Week 1): Pt will demonstrated selective attention to tasks with min verbal cueing.  Recommendations for other services: None    Skilled Therapeutic Intervention Skilled OT evaluation completed with the creation of pt centered OT POC. Pt educated on condition, ELOS, rehab expectations, and fall risk reduction strategies throughout session.  Pt with severe  headache and all lights off upon entry. Pt completed bed mobility, functional mobility, and ADLs with supervision. Pt showered with (S) and able to dress with (S). Completed oral care and general hygiene at the sink with set-up A. See overall ADL function below. Pt left in bed with mom present, alarm on, call bell in reach, and all needs met.  ADL ADL Eating: Independent Grooming: Independent;Supervision/safety (required multiple cues to not brush over staples) Upper Body Bathing: Supervision/safety Where Assessed-Upper Body Bathing: Shower Lower Body Bathing: Supervision/safety Where Assessed-Lower Body Bathing: Shower Upper Body Dressing: Supervision/safety Where Assessed-Upper Body Dressing: Edge of bed Lower Body Dressing: Contact guard Where Assessed-Lower Body Dressing: Edge of bed Toileting: Supervision/safety Toilet Transfer: Close supervision Toilet Transfer Method: Ambulating Tub/Shower Transfer: Close supervison Clinical cytogeneticist Method: Optometrist: Civil engineer, contracting with back;Grab bars Gaffer Transfer: Distant supervision Social research officer, government Method: Heritage manager: Shower seat with back Mobility  Bed Mobility Bed Mobility: Rolling Left;Left Sidelying to Sit;Sitting - Scoot to Edge of Bed;Sit to Supine;Sit to Sidelying Left Rolling Left: Independent with assistive device Left Sidelying to Sit: Contact Guard/Touching assist (HHA to sit up) Sitting - Scoot to Edge of Bed: Supervision/Verbal cueing Sit to Supine: Supervision/Verbal cueing Sit to Sidelying Left: Supervision/Verbal cueing Transfers Sit to Stand: Supervision/Verbal cueing Stand to Sit: Supervision/Verbal cueing   Discharge Criteria: Patient will be discharged from OT if patient refuses treatment 3 consecutive times without medical reason, if treatment goals not met, if there is a change in medical status, if patient makes no progress towards goals or if  patient is discharged from hospital.  The above assessment, treatment plan, treatment alternatives and goals were discussed and mutually agreed upon: by patient and by family  Madaleine Simmon 01/22/2022, 1:07 PM

## 2022-01-22 NOTE — Plan of Care (Signed)
  Problem: RH Balance Goal: LTG Patient will maintain dynamic standing with ADLs (OT) Description: LTG:  Patient will maintain dynamic standing balance with assist during activities of daily living (OT)  Flowsheets (Taken 01/22/2022 1214) LTG: Pt will maintain dynamic standing balance during ADLs with: Independent   Problem: RH Dressing Goal: LTG Patient will perform lower body dressing w/assist (OT) Description: LTG: Patient will perform lower body dressing with assist, with/without cues in positioning using equipment (OT) Flowsheets (Taken 01/22/2022 1214) LTG: Pt will perform lower body dressing with assistance level of: Independent   Problem: RH Tub/Shower Transfers Goal: LTG Patient will perform tub/shower transfers w/assist (OT) Description: LTG: Patient will perform tub/shower transfers with assist, with/without cues using equipment (OT) Flowsheets (Taken 01/22/2022 1214) LTG: Pt will perform tub/shower stall transfers with assistance level of: Independent with assistive device   Problem: RH Memory Goal: LTG Patient will demonstrate ability for day to day recall/carry over during activities of daily living with assistance level (OT) Description: LTG:  Patient will demonstrate ability for day to day recall/carry over during activities of daily living with assistance level (OT). Flowsheets (Taken 01/22/2022 1214) LTG:  Patient will demonstrate ability for day to day recall/carry over during activities of daily living with assistance level (OT): Supervision   Problem: RH Attention Goal: LTG Patient will demonstrate this level of attention during functional activites (OT) Description: LTG:  Patient will demonstrate this level of attention during functional activites  (OT) Flowsheets (Taken 01/22/2022 1214) Patient will demonstrate above attention level in the following environment: Controlled LTG: Patient will demonstrate this level of attention during functional activites (OT):  Supervision   Problem: RH Awareness Goal: LTG: Patient will demonstrate awareness during functional activites type of (OT) Description: LTG: Patient will demonstrate awareness during functional activites type of (OT) Flowsheets (Taken 01/22/2022 1214) LTG: Patient will demonstrate awareness during functional activites type of (OT): Supervision

## 2022-01-23 DIAGNOSIS — G479 Sleep disorder, unspecified: Secondary | ICD-10-CM

## 2022-01-23 MED ORDER — TRAZODONE HCL 50 MG PO TABS
50.0000 mg | ORAL_TABLET | Freq: Every day | ORAL | Status: DC
Start: 2022-01-23 — End: 2022-01-27
  Administered 2022-01-23 – 2022-01-26 (×4): 50 mg via ORAL
  Filled 2022-01-23 (×4): qty 1

## 2022-01-23 MED ORDER — GABAPENTIN 100 MG PO CAPS
100.0000 mg | ORAL_CAPSULE | Freq: Three times a day (TID) | ORAL | Status: AC
  Administered 2022-01-23 – 2022-01-24 (×5): 100 mg via ORAL
  Filled 2022-01-23 (×5): qty 1

## 2022-01-23 NOTE — Progress Notes (Signed)
PROGRESS NOTE   Subjective/Complaints: Had a restless night. Therapy wore her out yesterday. She feels that her days and nights are upside down but does much better in the day. Headaches are better  ROS: Patient denies fever, rash, sore throat, blurred vision, dizziness, nausea, vomiting, diarrhea, cough, shortness of breath or chest pain, joint or back/neck pain      Objective:   No results found.  Recent Labs    01/22/22 0527  WBC 5.0  HGB 14.4  HCT 41.6  PLT 373   Recent Labs    01/22/22 0527  NA 136  K 4.1  CL 100  CO2 21*  GLUCOSE 111*  BUN 11  CREATININE 0.67  CALCIUM 9.7    Intake/Output Summary (Last 24 hours) at 01/23/2022 1017 Last data filed at 01/23/2022 0743 Gross per 24 hour  Intake 960 ml  Output --  Net 960 ml        Physical Exam: Vital Signs Blood pressure 115/85, pulse 85, temperature 97.9 F (36.6 C), temperature source Oral, resp. rate 18, height 5\' 8"  (1.727 m), SpO2 100 %.  Constitutional: No distress . Vital signs reviewed. HEENT: NCAT, EOMI, oral membranes moist Neck: supple Cardiovascular: RRR without murmur. No JVD    Respiratory/Chest: CTA Bilaterally without wheezes or rales. Normal effort    GI/Abdomen: BS +, non-tender, non-distended Ext: no clubbing, cyanosis, or edema Psych: pleasant and cooperative  Skin: Clean and intact without signs of breakdown, staples intact Neuro:  Pt is alert. Oriented. Remains somewhat distracted. Fair awareness and memory. Motor 4-5/5. No focal sensory deficits Musculoskeletal: low back tender   Assessment/Plan: 1. Functional deficits which require 3+ hours per day of interdisciplinary therapy in a comprehensive inpatient rehab setting. Physiatrist is providing close team supervision and 24 hour management of active medical problems listed below. Physiatrist and rehab team continue to assess barriers to discharge/monitor patient progress  toward functional and medical goals  Care Tool:  Bathing    Body parts bathed by patient: Right arm, Left upper leg, Right lower leg, Left arm, Chest, Left lower leg, Abdomen, Face, Front perineal area, Buttocks, Right upper leg         Bathing assist Assist Level: Supervision/Verbal cueing     Upper Body Dressing/Undressing Upper body dressing   What is the patient wearing?: Pull over shirt    Upper body assist Assist Level: Supervision/Verbal cueing    Lower Body Dressing/Undressing Lower body dressing      What is the patient wearing?: Underwear/pull up, Pants     Lower body assist Assist for lower body dressing: Contact Guard/Touching assist     Toileting Toileting Toileting Activity did not occur (Clothing management and hygiene only):  (pt used bathroom)  Toileting assist Assist for toileting: Supervision/Verbal cueing     Transfers Chair/bed transfer  Transfers assist     Chair/bed transfer assist level: Supervision/Verbal cueing     Locomotion Ambulation   Ambulation assist      Assist level: Supervision/Verbal cueing Assistive device: No Device Max distance: >150 ft   Walk 10 feet activity   Assist     Assist level: Supervision/Verbal cueing Assistive device: No Device  Walk 50 feet activity   Assist    Assist level: Supervision/Verbal cueing Assistive device: No Device    Walk 150 feet activity   Assist    Assist level: Supervision/Verbal cueing Assistive device: No Device    Walk 10 feet on uneven surface  activity   Assist     Assist level: Supervision/Verbal cueing     Wheelchair     Assist Is the patient using a wheelchair?: No             Wheelchair 50 feet with 2 turns activity    Assist            Wheelchair 150 feet activity     Assist          Blood pressure 115/85, pulse 85, temperature 97.9 F (36.6 C), temperature source Oral, resp. rate 18, height 5\' 8"  (1.727 m),  SpO2 100 %.  Medical Problem List and Plan: 1. 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  Status post coil embolization 01/07/2022 per Dr. 01/09/2022.             -patient may shower             -ELOS/Goals: 7 days, mod I to supervision goals with PT, OT, SLP             -Continue CIR therapies including PT, OT, and SLP  2.  Antithrombotics: -DVT/anticoagulation:  Pharmaceutical: Lovenox initiated 01/14/2022             -antiplatelet therapy: N/A 3. Pain Management: Neurontin 200 mg every 8 hours, Robaxin as needed, Advil 400 mg every 6 hours as needed headache             -continue topamax 25mg  bid    -decrease gabapentin to 100mg  bid  - kpad for low back 4. Mood/Sleep: Melatonin 5 mg nightly.  Provide emotional support             -antipsychotic agents: N/A  -add trazodone to regimen 5. Neuropsych/cognition: This patient is capable of making decisions on her own behalf. 6. Skin/Wound Care: Routine skin checks, staples out soon 7. Fluids/Electrolytes/Nutrition: encourage PO    lab work is within normal limits.  8.  Seizure prophylaxis.  Keppra 500 mg twice daily 9.  Hypertension.Nimotop protocol             -bp well controlled at present 10.  Myopericarditis.  Follow-up outpatient Dr. 01/16/2022             -pt seems currently asymptomatic    LOS: 2 days A FACE TO FACE EVALUATION WAS PERFORMED  01/23/2022, 10:17 AM

## 2022-01-23 NOTE — Care Management (Signed)
[facility] Individual Statement of Services  Patient Name:  Judy Walsh  Date:  01/23/2022  Welcome to the [facility]patient [facility].  Our goal is to provide you with an individualized program based on your diagnosis and situation, designed to meet your specific needs.  With this comprehensive rehabilitation program, you will be expected to participate in at least 3 hours of rehabilitation therapies Monday-Friday, with modified therapy programming on the weekends.  Your rehabilitation program will include the following services:  Physical Therapy (PT), Occupational Therapy (OT), 24 hour per day rehabilitation nursing, Therapeutic Recreaction (TR), Psychology, Neuropsychology, Care Coordinator, Rehabilitation Medicine, Nutrition Services, Pharmacy Services, and Other  Weekly team conferences will be held on Tuesdays to discuss your progress.  Your Inpatient Rehabilitation Care Coordinator will talk with you frequently to get your input and to update you on team discussions.  Team conferences with you and your family in attendance may also be held.  Expected length of stay: 5--7 days    Overall anticipated outcome: Independent  Depending on your progress and recovery, your program may change. Your Inpatient Rehabilitation Care Coordinator will coordinate services and will keep you informed of any changes. Your Inpatient Rehabilitation Care Coordinator's name and contact numbers are listed  below.  The following services may also be recommended but are not provided by the Inpatient Rehabilitation Center:  Driving Evaluations Home Health Rehabiltiation Services Outpatient Rehabilitation Services Vocational Rehabilitation   Arrangements will be made to provide these services after discharge if needed.  Arrangements include referral to agencies that provide these services.  Your insurance has been verified to be:  Community education officer  Your primary doctor is:  No PCP  Pertinent  information will be shared with your doctor and your insurance company.  Inpatient Rehabilitation Care Coordinator:  Susie Cassette [phone] or ([phone]  Information discussed with and copy given to patient by: Gretchen Short, 01/23/2022, 8:56 AM

## 2022-01-23 NOTE — Progress Notes (Addendum)
Speech Language Pathology Daily Session Note  Patient Details  Name: Judy Walsh MRN: [id number] Date of Birth: Apr 11, 1976  Today's Date: 01/23/2022 SLP Individual Time: 1105-1200 SLP Individual Time Calculation (min): 55 min  Short Term Goals: Week 1: SLP Short Term Goal 1 (Week 1): Pt will complete generative and convergent naming tasks with 80% accuracy given Min A. - Did not formally address this session.   SLP Short Term Goal 2 (Week 1): Pt will utilize compensatory memory strategies (internal and external aids) to recall information from daily events and restorative tasks x 4 with Min A. - Recalled 2 out of 4 tasks completed during ST session this date given Sup A.   SLP Short Term Goal 3 (Week 1): Pt will participate in mildly-complex iADL tasks targeting medication and financial skills with 80% accuracy given Min A.- Completed ALFA "Counting Money" subtest with 80% accuracy given extended processing time.   SLP Short Term Goal 4 (Week 1): Pt will sustain attention to functional tasks for up to 20 minutes with less than 3 breaks needed given Sup A. - Pt sustained attention to complete ALFA subtest for up to 20 minutes with no rest breaks needed given Sup A.  Skilled Therapeutic Interventions: S: Pt seen this date for skilled ST intervention targeting cognitive-linguistic goals outlined above. Pt received awake/alert and sitting on EOB. Reports headache; LPN notified. Agreeable to ST intervention in speech office. Ambulated to office with CGA for safety.   O: Please see above for objective data collected re: pt's performance on targeted goals. SLP initiated education re: current deficits, importance of referencing external memory aids (memory notebook initiated) for errorless learning given severity of memory defiicts, compensatory memory strategies (e.g. WRAP), energy conservation to manage cognitive fatigue, compensatory executive functioning strategies, and ST POC. Pt verbalized  understanding of all information. Benefited from extended processing time and repetition in the setting of decreased attention and working memory. Mildly impulsive at times. Pt informed of recommendations to have assistance and supervision with all iADL tasks at time of discharge given severity of cognitive impairments, as well as ongoing ST intervention in the OP setting; pt verbalized understanding, though will benefit from reinforcement.   A: Pt remains stimulable for skilled ST intervention as evident by improvement noted in attention to task and performance on ALFA "Counting Money" subtest. Continue to recommend skilled ST intervention to maximize pt independence and decrease caregiver burden in preparation for upcoming discharge home.   P: Pt returned to room and left in bed with all safety measures activated. Daughter present in room upon return. Call bell reviewed and within reach and all immediate needs met. Continue per current ST POC.  Pain Reports headache; LPN notified and reports being unable to provide medication until 1200.  Therapy/Group: Individual Therapy  Malayah Demuro A Pranathi Winfree 01/23/2022, 3:05 PM

## 2022-01-23 NOTE — Progress Notes (Signed)
Patient ID: Judy Walsh, female   DOB: 1976-01-07, 46 y.o.   MRN: [id number]  Therapy team recommends outpatient PT/OT/SLP and no DME.  SW met with pt in room to discuss further. States she would like to leave as soon as possible. Pt reports having support from her dtr and son and his wife at discharge. She states SW primary contact is her son Lovena Le. SW informed will work on finding PCP in [place]. She is amenable to outpatient therapies at [facility].   1203-SW spoke with pt son Lovena Le to introduce self, explain role, discuss discharge process, and inform on ELOS 5-7 days. SW discussed 24/7 care for pt at discharge, and transportation to medical appointments. Reports between himself, his sister Abelino Derrick, and grandparents-- she will have support that is needed.  SW informed on outpatient therapies recommended and referral will be sent to [facility]. SW discussed family education and if his grandfather can come in tomorrow. States he is on his way to the hospital, and asked SW to speak with him about time tomorrow.   SW faxed outpatient PT/OT/SLP referral to [facility] (O:[phone]/H:[phone]). *SW confirmed with Judson Roch that referral was received.   New patient appointment scheduled for Tuesday, July 18 at 2:30pm with Leretha Pol.  *SW met with pt and pt father in room to discuss family edu tomorrow; agreeable to 1pm-4pm. State his wife will be staying this evening and will be here tomorrow. SW discussed new patient appointment and outpatient therapies in place.   Loralee Pacas, MSW, [place] Office: [phone] Cell: [phone] Fax: [phone]

## 2022-01-23 NOTE — Progress Notes (Signed)
Occupational Therapy Session Note  Patient Details  Name: Judy Walsh MRN: [id number] Date of Birth: [birth year]  Today's Date: 01/23/2022 OT Individual Time: 1400-1500 OT Individual Time Calculation (min): 60 min   Short Term Goals: Week 1:  OT Short Term Goal 1 (Week 1): Pt will complete LB dressing with Mod I. OT Short Term Goal 2 (Week 1): Pt will maintain dynamic standing balance during funcitonal ADL with Mod I. OT Short Term Goal 3 (Week 1): Pt will increase memory recall with familiar tasks with min verbal cues. OT Short Term Goal 4 (Week 1): Pt will demonstrated selective attention to tasks with min verbal cueing.  Skilled Therapeutic Interventions/Progress Updates:    Pt greeted semi-reclined in bed on the phone with her insurance company. OT answered questions for insurance about rehabilitation program and therapeutic services patient is receiving at [facility]. Pt declined to shower but ambulated to the bathroom and completed toileting tasks with supervision. Worked on dual task activity of pathfinding to outside while trying to name the months of the year backwards. Pt with increased difficulty as environment became more distracting on our way outside. Once we sat down, patient could attend slightly better to recalling months, but still needed moderate cues to complete in full. Educated patient on purpose of activity. Discussed PLOF, especially regarding to her work, and goals for therapy. Pathfinding back to room with moderate cues to locate signage to assist. Patient able to recall that she was on the 4th floor, but had difficulty remembering if hallways or locations were familiar. Memory activity using BITs with patient able to recall up to 5 words without cues in quiet environment. Progressed to trail making activity on BITS alternating letters and numbers with increased difficulty. Patient required moderate cues and would frequently get stuck. Reaction time with BITS demonstrating  average reaction time in all 4 quadrants. Pt ambulated back to room without AD/supervision and left sermi-reclined in bed with family present and needs met.    Therapy Documentation Precautions:  Precautions Precautions: Fall Precaution Comments: SBP goal 130-150 Restrictions Weight Bearing Restrictions: No  Pain:  Pt reports headache, no number given. Rest and repositioned.    Therapy/Group: Individual Therapy  Valma Cava 01/23/2022, 2:54 PM

## 2022-01-23 NOTE — Progress Notes (Signed)
Inpatient Rehabilitation Care Coordinator Assessment and Plan Patient Details  Name: Judy Walsh MRN: [id number] Date of Birth: [birth year]  Today's Date: 01/23/2022  Hospital Problems: Principal Problem:   Subarachnoid hemorrhage [facility])  Past Medical History:  Past Medical History:  Diagnosis Date   Chest x-ray abnormality 06/09/2021   CXR 11/22: Vague nodular opacities in the mid left lung and right upper lobe which may be due to overlapping structures. Comparison with older imaging studies or follow-up is recommended to exclude pulmonary nodule.    Myopericarditis 06/08/2021   admx 11/22 >> hsTrop mildly elevated w/o trend; BNP and CRP high - trending down at DC // Echocardiogram 11/22: no effusion, EF 55-60, no RWMA, mild LVH >> NSAIDs/Colchicine   Past Surgical History:  Past Surgical History:  Procedure Laterality Date   IR ANGIO INTRA EXTRACRAN SEL INTERNAL CAROTID BILAT MOD SED  01/07/2022   IR ANGIO VERTEBRAL SEL VERTEBRAL UNI R MOD SED  01/08/2022   IR ANGIOGRAM FOLLOW UP STUDY  01/07/2022   IR ANGIOGRAM FOLLOW UP STUDY  01/07/2022   IR ANGIOGRAM FOLLOW UP STUDY  01/07/2022   IR ANGIOGRAM FOLLOW UP STUDY  01/07/2022   IR NEURO EACH ADD'L AFTER BASIC UNI LEFT (MS)  01/08/2022   IR NEURO EACH ADD'L AFTER BASIC UNI RIGHT (MS)  01/08/2022   IR TRANSCATH/EMBOLIZ  01/07/2022   RADIOLOGY WITH ANESTHESIA N/A 01/07/2022   Procedure: IR WITH ANESTHESIA;  Surgeon: Consuella Lose, MD;  Location: [place];  Service: Radiology;  Laterality: N/A;   Social History:  reports that she has never smoked. She has never used smokeless tobacco. She reports that she does not drink alcohol and does not use drugs.  Family / Support Systems Marital Status: Single Patient Roles: Parent Spouse/Significant Other: N/A Children: Two children- 49 y.o. dtr Teely and 33 y.o. son Lovena Le Other Supports: None reported Anticipated Caregiver: children and parents Ability/Limitations of Caregiver: Pt son taylor  and DIL work. Pt dtr is only 47 yrs old. Pt parents are retired. Caregiver Availability: 24/7 Family Dynamics: Pt lives with her 46 y.o. 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 Education: college Field seismologist - How often do you need to have someone help you when you read instructions, pamphlets, or other written material from your doctor or pharmacy?: Never Writes: Yes Employment Status: Employed Name of Employer: [employer] and as a Fish farm manager of Employment: 10 Return to Work Plans: TBD Public relations account executive Issues: Denies Insurance account manager: N/A   Abuse/Neglect Abuse/Neglect Assessment Can Be Completed: Yes Physical Abuse: Denies Verbal Abuse: Denies Sexual Abuse: Denies Exploitation of patient/patient's resources: Denies Self-Neglect: Denies  Patient response to: Social Isolation - How often do you feel lonely or isolated from those around you?: Never  Emotional Status Pt's affect, behavior and adjustment status: Pt in good spirits at time of visit. Pt is eager to discharge to home. Recent Psychosocial Issues: Denies Psychiatric History: Denies Substance Abuse History: Pt only admits to drinking 1-2 glasses of red wine 2-3xs per week.  Patient / Family Perceptions, Expectations & Goals Pt/Family understanding of illness & functional limitations: Pt and family have ageneral understanding of care needs Premorbid pt/family roles/activities: Independent Anticipated changes in roles/activities/participation: Supervision with ADLs/IADLs Pt/family expectations/goals: Pt goal is "getting out of here."  [employer]: None Premorbid Home Care/DME Agencies: None Transportation available at discharge: TBD  Is the patient able to respond to transportation needs?: Yes In the past 12 months, has lack  of transportation kept you from medical appointments or from getting medications?: No In the past 12 months, has lack of transportation kept you from meetings, work, or from getting things needed for daily living?: No Resource referrals recommended: Neuropsychology  Discharge Planning Living Arrangements: Children Support Systems: Children, Parent Type of Residence: Private residence Insurance Resources: Multimedia programmer (specify) Scientist, clinical (histocompatibility and immunogenetics)) Financial Resources: Employment Museum/gallery curator Screen Referred: No Living Expenses: Medical laboratory scientific officer Management: Patient Does the patient have any problems obtaining your medications?: No Home Management: Pt manages all homecare needs Patient/Family Preliminary Plans: TBD Care Coordinator Barriers to Discharge: Decreased caregiver support, Lack of/limited family support Care Coordinator Anticipated Follow Up Needs: HH/OP Expected length of stay: 5-7 days  Clinical Impression SW met with pt in room to introduce self, explain role, and discuss discharge process. Pt is not a English as a second language teacher. No HCPOA. DME: RW, cane, and crutches.   Lilyahna Sirmon A Keirsten Matuska 01/23/2022, 12:50 PM

## 2022-01-23 NOTE — Progress Notes (Signed)
Physical Therapy Session Note  Patient Details  Name: Judy Walsh MRN: [id number] Date of Birth: March 31, 1976  Today's Date: 01/23/2022 PT Individual Time: 0950-1105 PT Individual Time Calculation (min): 75 min   Short Term Goals: Week 1:  PT Short Term Goal 1 (Week 1): STG=LTG due to ELOS.  Skilled Therapeutic Interventions/Progress Updates:     Patient in bed asleep with her daughter in the room upon PT arrival. Patient alert and agreeable to PT session. Patient reported 3-5/10 headache pain during session, RN made aware. PT provided repositioning, rest breaks, and distraction as pain interventions throughout session.   Patient with improved activity tolerance and attention today with reduced headache pain. Utilized sun glasses independently to manage light sensitivity. Patient tolerated increased environmental stimulation, in hallways or gyms >60 min of session. Patient was oriented x4 and able to sustain attention to both functional tasks and conversation >15 min today. Demonstrated poor safety awareness x1, sitting on rolling stool spontaneously with minor LOB, patient able to self correct, but required cues to select a safer surface to sit on.   Discussed d/c planning, recommendations for 24/7 supervision due to decreased cognition. Patient reports she would prefer to go to her home and have family/friends provide supervision rather than stay with her mother at d/c. CSW made aware.   Therapeutic Activity: Bed Mobility: Patient performed supine to sit independently.  Transfers: Patient performed sit to/from stand independently without an AD throughout session. Performed an ambulatory toilet transfer with distant supervision without LOB. Patient was continent of bladder during toileting, performed peri-care, lower body clothing management, and hand hygiene independently.  Gait Training:  Patient ambulated >100 feet x3 with supervision without an AD. Ambulated with decreased gait speed,  narrow BOS, and mild variability in foot placement, deficits increased with external distraction or dual task walking while talking, no LOB throughout . Provided cues for increased gait speed and arm swing for improved balance with gait. 6 Min Walk Test:  Instructed patient to ambulate as quickly and as safely as possible for 6 minutes using LRAD. Patient was allowed to take standing rest breaks without stopping the test, but if the patient required a sitting rest break the clock would be stopped and the test would be over.  Results: 1132 feet (345 meters, Avg speed 0.9 m/s) with supervision. Results indicate that the patient has reduced endurance with ambulation compared to age matched norms.  Age Matched Norms: 47-69 yo F: 538 meters MDC: 58.21 meters (190.98 feet) or 50 meters (ANPTA Core Set of Outcome Measures for Adults with Neurologic Conditions, 2018)  Neuromuscular Re-ed: Berg Balance Test Sit to Stand: Able to stand without using hands and stabilize independently Standing Unsupported: Able to stand safely 2 minutes Sitting with Back Unsupported but Feet Supported on Floor or Stool: Able to sit safely and securely 2 minutes Stand to Sit: Sits safely with minimal use of hands Transfers: Able to transfer safely, minor use of hands Standing Unsupported with Eyes Closed: Able to stand 10 seconds safely Standing Ubsupported with Feet Together: Able to place feet together independently and stand 1 minute safely From Standing, Reach Forward with Outstretched Arm: Can reach confidently >25 cm (10") From Standing Position, Pick up Object from Floor: Able to pick up shoe safely and easily From Standing Position, Turn to Look Behind Over each Shoulder: Looks behind from both sides and weight shifts well Turn 360 Degrees: Able to turn 360 degrees safely in 4 seconds or less Standing Unsupported, Alternately Place Feet  on Step/Stool: Able to stand independently and safely and complete 8 steps in 20  seconds Standing Unsupported, One Foot in Front: Able to place foot tandem independently and hold 30 seconds Standing on One Leg: Able to lift leg independently and hold > 10 seconds Total Score: 56/56 Patient demonstrated reduced fall risk noted by score of 56/56 on the Berg Balance Scale.  <45/56 = fall risk, <42/56 = predictive of recurrent falls, <40/56 = 100% fall risk  >41 = independent, 21-40 = assistive device, 0-20 = wheelchair level  MDC 6.9 (4 pts 45-56, 5 pts 35-44, 7 pts 25-34) (ANPTA Core Set of Outcome Measures for Adults with Neurologic Conditions, 2018) Functional Gait  Assessment Gait Level Surface: Walks 20 ft in less than 5.5 sec, no assistive devices, good speed, no evidence for imbalance, normal gait pattern, deviates no more than 6 in outside of the 12 in walkway width. Change in Gait Speed: Able to smoothly change walking speed without loss of balance or gait deviation. Deviate no more than 6 in outside of the 12 in walkway width. Gait with Horizontal Head Turns: Performs head turns smoothly with no change in gait. Deviates no more than 6 in outside 12 in walkway width Gait with Vertical Head Turns: Performs head turns with no change in gait. Deviates no more than 6 in outside 12 in walkway width. Gait and Pivot Turn: Pivot turns safely within 3 sec and stops quickly with no loss of balance. Step Over Obstacle: Is able to step over 2 stacked shoe boxes taped together (9 in total height) without changing gait speed. No evidence of imbalance. Gait with Narrow Base of Support: Is able to ambulate for 10 steps heel to toe with no staggering. Gait with Eyes Closed: Walks 20 ft, no assistive devices, good speed, no evidence of imbalance, normal gait pattern, deviates no more than 6 in outside 12 in walkway width. Ambulates 20 ft in less than 7 sec. Ambulating Backwards: Walks 20 ft, uses assistive device, slower speed, mild gait deviations, deviates 6-10 in outside 12 in walkway  width. Steps: Alternating feet, no rail. Total Score: 29/30 Patient demonstrates reduced fall risk as noted by score of 29/30 on  Functional Gait Assessment.   <22/30 = predictive of falls, <20/30 = fall in 6 months, <18/30 = predictive of falls in PD MCID: 5 points stroke population, 4 points geriatric population (ANPTA Core Set of Outcome Measures for Adults with Neurologic Conditions, 2018) Five times Sit to Stand Test (FTSS) Method: Use a straight back chair with a solid seat that is 17-18" high. Ask participant to sit on the chair with arms folded across their chest.   Instructions: "Stand up and sit down as quickly as possible 5 times, keeping your arms folded across your chest."   Measurement: Stop timing when the participant touches the chair in sitting the 5th time. TIME: 10.8 sec Cut off scores indicative of increased fall risk: >12 sec CVA, >16 sec PD, >13 sec vestibular (ANPTA Core Set of Outcome Measures for Adults with Neurologic Conditions, 2018)  Patient sitting EOB with CSW in the room at end of session with breaks locked, bed alarm set, and all needs within reach.   Therapy Documentation Precautions:  Precautions Precautions: Fall Precaution Comments: SBP goal 130-150 Restrictions Weight Bearing Restrictions: No    Therapy/Group: Individual Therapy  Chelle Cayton L Holman Bonsignore PT, DPT  01/23/2022, 12:19 PM

## 2022-01-24 NOTE — Progress Notes (Signed)
Occupational Therapy Session Note  Patient Details  Name: Judy Walsh MRN: [id number] Date of Birth: [birth year]  Today's Date: 01/24/2022 OT Individual Time: 3704-8889 OT Individual Time Calculation (min): 30 min    Short Term Goals: Week 1:  OT Short Term Goal 1 (Week 1): Pt will complete LB dressing with Mod I. OT Short Term Goal 2 (Week 1): Pt will maintain dynamic standing balance during funcitonal ADL with Mod I. OT Short Term Goal 3 (Week 1): Pt will increase memory recall with familiar tasks with min verbal cues. OT Short Term Goal 4 (Week 1): Pt will demonstrated selective attention to tasks with min verbal cueing.  Skilled Therapeutic Interventions/Progress Updates:   Pt received supine in bed and agreeable to OT session. Parents both present in the room. Discussed discharge with patient and safety adherence while still in rehab. Educated pt on brain swelling and difficulty with impulsivity, decisions, attention, and memory. Discussed sign up sheet for getting to/from appointments. Completed functional mobility to rehab gym with Mod I. 5 lbs. Dowel held overhead, with squats, and problem-solving scenarios given throughout challenging pts dual tasks, critical thinking, safety awareness and problem-solving abilities. Stood with (S) for balance and threw basketball on trampoline with conversation throughout. Able to maintain conversation while throwing/catching. Challenged throughout with various safety scenarios at home to address safety awareness. Pt left win bed with call bell in reach, and all other needs met. Parents present in the room.  Therapy Documentation Precautions:  Precautions Precautions: Fall Precaution Comments: SBP goal 130-150 Restrictions Weight Bearing Restrictions: No  Therapy/Group: Individual Therapy  Catalina Lunger 01/24/2022, 7:22 AM

## 2022-01-24 NOTE — Discharge Summary (Signed)
Physician Discharge Summary  Patient ID: Judy Walsh MRN: [id number] DOB/AGE: March 05, 1976 45 y.o.  Admit date: 01/21/2022 Discharge date: 01/27/2022  Discharge Diagnoses:  Principal Problem:   Subarachnoid hemorrhage [facility]) DVT prophylaxis Seizure prophylaxis Hypertension Myopericarditis  Discharged Condition: Stable  Significant Diagnostic Studies: VAS [place] TRANSCRANIAL DOPPLER  Result Date: 01/22/2022  Transcranial Doppler Patient Name:  Judy Walsh  Date of Exam:   01/20/2022 Medical Rec #: [id number]        Accession #:    [id number] Date of Birth: [birth year]        Patient Gender: F Patient Age:   33 years Exam Location:  [facility] Procedure:      VAS [place] TRANSCRANIAL DOPPLER Referring Phys: Verlin Dike --------------------------------------------------------------------------------  Indications: Subarachnoid hemorrhage. History: S/p coil embolization Acom/left Pcom aneurysms. Comparison Study: 01/17/2022 TCD- mild bilateral MCA vasospasm Performing Technologist: Gertie Fey MHA, RDMS, RVT, RDCS  Examination Guidelines: A complete evaluation includes B-mode imaging, spectral Doppler, color Doppler, and power Doppler as needed of all accessible portions of each vessel. Bilateral testing is considered an integral part of a complete examination. Limited examinations for reoccurring indications may be performed as noted.  +----------+-------------+----------+-----------+-------+ RIGHT TCD Right VM (cm)Depth (cm)PulsatilityComment +----------+-------------+----------+-----------+-------+ MCA          103.00       5.50      0.53            +----------+-------------+----------+-----------+-------+ ACA          -70.00                 0.55            +----------+-------------+----------+-----------+-------+ Term ICA      83.00                 1.06            +----------+-------------+----------+-----------+-------+ PCA           23.00                 0.57             +----------+-------------+----------+-----------+-------+ Opthalmic     19.00                 1.21            +----------+-------------+----------+-----------+-------+ ICA siphon    27.00                 1.36            +----------+-------------+----------+-----------+-------+ Vertebral    -37.00                 0.58            +----------+-------------+----------+-----------+-------+ Distal ICA    38.00                                 +----------+-------------+----------+-----------+-------+  +----------+------------+----------+-----------+-------+ LEFT TCD  Left VM (cm)Depth (cm)PulsatilityComment +----------+------------+----------+-----------+-------+ MCA          129.00      4.60      0.58            +----------+------------+----------+-----------+-------+ ACA          -18.00                0.93            +----------+------------+----------+-----------+-------+ Term ICA     76.00  0.78            +----------+------------+----------+-----------+-------+ PCA          33.00                 0.98            +----------+------------+----------+-----------+-------+ Opthalmic    12.00                 1.38            +----------+------------+----------+-----------+-------+ ICA siphon   29.00                 0.96            +----------+------------+----------+-----------+-------+ Vertebral    -31.00                1.04            +----------+------------+----------+-----------+-------+ Distal ICA   19.00                                 +----------+------------+----------+-----------+-------+  +------------+------+-------+             VM cm Comment +------------+------+-------+ Prox Basilar-33.00        +------------+------+-------+ +----------------------+----+ Right Lindegaard Ratio2.71 +----------------------+----+ +---------------------+----+ Left Lindegaard Ratio6.79 +---------------------+----+   Summary:  similar elevated bilateral middle cerebral and teriminal right internal carotid artery mean flow velocities as before, no significant vasospasm suggested. Normal mean flow velocities in majority of remaining identified vessels of anterior and posterior cerebral circulation. normal flow directions. *See table(s) above for TCD measurements and observations.  Diagnosing physician: Marvel Plan MD Electronically signed by Marvel Plan MD on 01/22/2022 at 6:14:51 PM.    Final    VAS [place] TRANSCRANIAL DOPPLER  Result Date: 01/22/2022  Transcranial Doppler Patient Name:  Judy Walsh  Date of Exam:   01/17/2022 Medical Rec #: [id number]        Accession #:    [id number] Date of Birth: [birth year]        Patient Gender: F Patient Age:   19 years Exam Location:  [facility] Procedure:      VAS [place] TRANSCRANIAL DOPPLER Referring Phys: Verlin Dike --------------------------------------------------------------------------------  Indications: Subarachnoid hemorrhage. Limitations: Constant patient movement. Comparison Study: Mild vasospasm on previous examination 01-15-2022 Performing Technologist: Jean Rosenthal RDMS, RVT  Examination Guidelines: A complete evaluation includes B-mode imaging, spectral Doppler, color Doppler, and power Doppler as needed of all accessible portions of each vessel. Bilateral testing is considered an integral part of a complete examination. Limited examinations for reoccurring indications may be performed as noted.  +----------+-------------+----------+-----------+-------+ RIGHT TCD Right VM (cm)Depth (cm)PulsatilityComment +----------+-------------+----------+-----------+-------+ MCA           93.00       5.80      0.76            +----------+-------------+----------+-----------+-------+ ACA          -38.00                 0.62            +----------+-------------+----------+-----------+-------+ Term ICA      55.00                 0.71             +----------+-------------+----------+-----------+-------+ PCA           36.00  0.40            +----------+-------------+----------+-----------+-------+ Opthalmic     16.00                 1.18            +----------+-------------+----------+-----------+-------+ ICA siphon    29.00                 1.51            +----------+-------------+----------+-----------+-------+ Vertebral    -22.00                 0.87            +----------+-------------+----------+-----------+-------+ Distal ICA    44.00                 0.68            +----------+-------------+----------+-----------+-------+  +----------+------------+----------+-----------+-------+ LEFT TCD  Left VM (cm)Depth (cm)PulsatilityComment +----------+------------+----------+-----------+-------+ MCA          109.00      5.00      0.72            +----------+------------+----------+-----------+-------+ ACA          -35.00                0.51            +----------+------------+----------+-----------+-------+ Term ICA     45.00                 0.86            +----------+------------+----------+-----------+-------+ PCA          24.00                 1.00            +----------+------------+----------+-----------+-------+ Opthalmic    17.00                 1.47            +----------+------------+----------+-----------+-------+ ICA siphon   25.00                 1.60            +----------+------------+----------+-----------+-------+ Vertebral    -30.00                0.58            +----------+------------+----------+-----------+-------+ Distal ICA   19.00                 0.74            +----------+------------+----------+-----------+-------+  +------------+-----+------------------------------------------+             VM cm                 Comment                   +------------+-----+------------------------------------------+ Prox Basilar     Unable to  insonate due to patient movement +------------+-----+------------------------------------------+ +----------------------+----+ Right Lindegaard Ratio2.11 +----------------------+----+ +---------------------+----+ Left Lindegaard Ratio5.74 +---------------------+----+  Summary:  Mildly levated bilateral middle cerebral arteries mean flow velocities improved from yesterday, no significant vasospasm indicated. Normal mean flow velocities in majority of remaining identified vessels of anterior and posterior cerebral circulation. normal flow directions. *See table(s) above for TCD measurements and observations.  Diagnosing physician: Marvel Plan MD Electronically signed by Marvel Plan MD on 01/22/2022 at 6:13:19 PM.    Final    VAS [place] TRANSCRANIAL DOPPLER  Result  Date: 01/16/2022  Transcranial Doppler Patient Name:  Judy Walsh  Date of Exam:   01/15/2022 Medical Rec #: [id number]        Accession #:    [id number] Date of Birth: 1975/12/05        Patient Gender: F Patient Age:   89 years Exam Location:  [facility] Procedure:      VAS [place] TRANSCRANIAL DOPPLER Referring Phys: Verlin Dike --------------------------------------------------------------------------------  Indications: Subarachnoid hemorrhage. History: S/p coil embolization Acom/left Pcom aneurysms. Limitations: Movement Comparison Study: 01/13/22 TCD Performing Technologist: Gertie Fey MHA, RDMS, RVT, RDCS  Examination Guidelines: A complete evaluation includes B-mode imaging, spectral Doppler, color Doppler, and power Doppler as needed of all accessible portions of each vessel. Bilateral testing is considered an integral part of a complete examination. Limited examinations for reoccurring indications may be performed as noted.  +----------+-------------+----------+-----------+-------+ RIGHT TCD Right VM (cm)Depth (cm)PulsatilityComment +----------+-------------+----------+-----------+-------+ MCA          129.00       5.60       0.85            +----------+-------------+----------+-----------+-------+ ACA          -17.00                 1.49            +----------+-------------+----------+-----------+-------+ Term ICA     116.00       5.90      0.85            +----------+-------------+----------+-----------+-------+ PCA           38.00                 0.90            +----------+-------------+----------+-----------+-------+ Opthalmic     19.00                 0.90            +----------+-------------+----------+-----------+-------+ ICA siphon    40.00                 0.95            +----------+-------------+----------+-----------+-------+ Vertebral    -42.00                 0.88            +----------+-------------+----------+-----------+-------+ Distal ICA    46.00                                 +----------+-------------+----------+-----------+-------+  +----------+------------+----------+-----------+-------------+ LEFT TCD  Left VM (cm)Depth (cm)Pulsatility   Comment    +----------+------------+----------+-----------+-------------+ MCA          157.00      5.00      0.83                  +----------+------------+----------+-----------+-------------+ ACA          -36.00                0.88                  +----------+------------+----------+-----------+-------------+ Term ICA                                   Not insonated +----------+------------+----------+-----------+-------------+ PCA  44.00                 0.70                  +----------+------------+----------+-----------+-------------+ Opthalmic    14.00                 1.10                  +----------+------------+----------+-----------+-------------+ ICA siphon   29.00                 1.09                  +----------+------------+----------+-----------+-------------+ Vertebral    -24.00                1.02                   +----------+------------+----------+-----------+-------------+ Distal ICA   28.00                                       +----------+------------+----------+-----------+-------------+  +------------+------+-------+             VM cm Comment +------------+------+-------+ Prox Basilar-48.00        +------------+------+-------+ +----------------------+----+ Right Lindegaard Ratio2.80 +----------------------+----+ +---------------------+----+ Left Lindegaard Ratio5.61 +---------------------+----+  Summary:  Elevated bilateral middle cerebral and teriminal right internal carotid artery mean flow velocities suggest mild vasospasm. Normal mean flow velocities in majority of remaining identified vessels of anterior and posterior cerebral circulation. *See table(s) above for TCD measurements and observations.  Diagnosing physician: Delia Heady MD Electronically signed by Delia Heady MD on 01/16/2022 at 11:41:30 AM.    Final    VAS [place] TRANSCRANIAL DOPPLER  Result Date: 01/14/2022  Transcranial Doppler Patient Name:  Judy Walsh  Date of Exam:   01/13/2022 Medical Rec #: [id number]        Accession #:    [id number] Date of Birth: July 30, 1975        Patient Gender: F Patient Age:   60 years Exam Location:  [facility] Procedure:      VAS [place] TRANSCRANIAL DOPPLER Referring Phys: Verlin Dike --------------------------------------------------------------------------------  Indications: Subarachnoid hemorrhage. History: S/p coil embolization Acom/left Pcom aneurysms. Comparison Study: 01/10/22 TCD Performing Technologist: Gertie Fey MHA, RDMS, RVT, RDCS  Examination Guidelines: A complete evaluation includes B-mode imaging, spectral Doppler, color Doppler, and power Doppler as needed of all accessible portions of each vessel. Bilateral testing is considered an integral part of a complete examination. Limited examinations for reoccurring indications may be performed as noted.   +----------+-------------+----------+-----------+-------+ RIGHT TCD Right VM (cm)Depth (cm)PulsatilityComment +----------+-------------+----------+-----------+-------+ MCA           66.00                 1.04            +----------+-------------+----------+-----------+-------+ ACA          -56.00                 0.89            +----------+-------------+----------+-----------+-------+ Term ICA      95.00                 0.89            +----------+-------------+----------+-----------+-------+ PCA           30.00  0.98            +----------+-------------+----------+-----------+-------+ Opthalmic     20.00                 1.47            +----------+-------------+----------+-----------+-------+ ICA siphon    95.00       6.30      0.98            +----------+-------------+----------+-----------+-------+ Vertebral    -51.00                 0.99            +----------+-------------+----------+-----------+-------+ Distal ICA    59.00                                 +----------+-------------+----------+-----------+-------+  +----------+------------+----------+-----------+-------+ LEFT TCD  Left VM (cm)Depth (cm)PulsatilityComment +----------+------------+----------+-----------+-------+ MCA          145.00      5.10      0.90            +----------+------------+----------+-----------+-------+ ACA          -35.00                0.81            +----------+------------+----------+-----------+-------+ Term ICA     111.00      6.00       0.9            +----------+------------+----------+-----------+-------+ PCA          25.00                 0.84            +----------+------------+----------+-----------+-------+ Opthalmic    17.00                 1.85            +----------+------------+----------+-----------+-------+ ICA siphon   73.00       6.30      0.78             +----------+------------+----------+-----------+-------+ Vertebral    -49.00                0.91            +----------+------------+----------+-----------+-------+ Distal ICA   33.00                                 +----------+------------+----------+-----------+-------+  +------------+------+-------+             VM cm Comment +------------+------+-------+ Prox Basilar-49.00        +------------+------+-------+ +----------------------+---+ Right Lindegaard Ratio1.1 +----------------------+---+ +---------------------+---+ Left Lindegaard Ratio4.4 +---------------------+---+  Summary:  Elevated mean flow velocities in left middle cerebral and terminal left ICA suggest mild vasospasm. Slightly elevated right terminal ICA mean flow velocities of unclear significance. *See table(s) above for TCD measurements and observations.  Diagnosing physician: Delia Heady MD Electronically signed by Delia Heady MD on 01/14/2022 at 1:15:22 PM.    Final    CT HEAD WO CONTRAST ( )  Result Date: 01/14/2022 CLINICAL DATA:  Subarachnoid hemorrhage EXAM: CT HEAD WITHOUT CONTRAST TECHNIQUE: Contiguous axial images were obtained from the base of the skull through the vertex without intravenous contrast. RADIATION DOSE REDUCTION: This exam was performed according to the departmental dose-optimization program which includes automated exposure control, adjustment of  the mA and/or kV according to patient size and/or use of iterative reconstruction technique. COMPARISON:  Three days ago FINDINGS: Brain: Right frontal ventriculostomy. Increase in lateral ventricular volume but no ventricular dilatation. No rebleeding or significant residual clot seen. Accounting for streak artifact no convincing infarct. Vascular: Aneurysm coils about the circle-of-Willis. Skull: Negative Sinuses/Orbits: Negative IMPRESSION: Increased lateral ventricular volume but no ventriculomegaly. Resolved high-density blood clot.  Electronically Signed   By: Tiburcio Pea M.D.   On: 01/14/2022 06:37   CT HEAD WO CONTRAST ( )  Result Date: 01/11/2022 CLINICAL DATA:  Mental status change persistent or worsening. EXAM: CT HEAD WITHOUT CONTRAST TECHNIQUE: Contiguous axial images were obtained from the base of the skull through the vertex without intravenous contrast. RADIATION DOSE REDUCTION: This exam was performed according to the departmental dose-optimization program which includes automated exposure control, adjustment of the mA and/or kV according to patient size and/or use of iterative reconstruction technique. COMPARISON:  CT head without contrast 01/07/2022 FINDINGS: Brain: Previously noted subarachnoid hemorrhage is near completely resolved. No new hemorrhage is present. A right frontal ventriculostomy catheter is in place. The tip is in the lateral aspect of the right lateral ventricle. No associated hydrocephalus. Previously noted ventricular hemorrhage and hydrocephalus has resolved. The brainstem and cerebellum are within normal limits. 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 Skull: Apart from the right frontal burr hole, the calvarium is intact. No significant extracranial soft tissue lesion is present. Sinuses/Orbits: The paranasal sinuses and mastoid air cells are clear. The globes and orbits are within normal limits. IMPRESSION: 1. Near complete resolution of subarachnoid hemorrhage. 2. Right frontal ventriculostomy catheter is in place. No associated hydrocephalus. 3. 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 4. No acute intracranial abnormality or significant interval change. Electronically Signed   By: Marin Roberts M.D.   On: 01/11/2022 14:30   VAS [place] TRANSCRANIAL DOPPLER  Result Date: 01/11/2022  Transcranial Doppler Patient Name:  Judy Walsh  Date of Exam:   01/10/2022  Medical Rec #: [id number]        Accession #:    [id number] Date of Birth: 01-12-1976        Patient Gender: F Patient Age:   24 years Exam Location:  [facility] Procedure:      VAS [place] TRANSCRANIAL DOPPLER Referring Phys: Verlin Dike --------------------------------------------------------------------------------  Indications: Subarachnoid hemorrhage. Comparison Study: 01-08-2022 Most recent prior study. Performing Technologist: Jean Rosenthal RDMS, RVT  Examination Guidelines: A complete evaluation includes B-mode imaging, spectral Doppler, color Doppler, and power Doppler as needed of all accessible portions of each vessel. Bilateral testing is considered an integral part of a complete examination. Limited examinations for reoccurring indications may be performed as noted.  +----------+-------------+----------+-----------+-------+ RIGHT TCD Right VM (cm)Depth (cm)PulsatilityComment +----------+-------------+----------+-----------+-------+ MCA           63.00                 1.13            +----------+-------------+----------+-----------+-------+ ACA          -34.00                 0.96            +----------+-------------+----------+-----------+-------+ Term ICA      66.00                 1.28            +----------+-------------+----------+-----------+-------+  PCA           23.00                 0.61            +----------+-------------+----------+-----------+-------+ Opthalmic     18.00                 1.83            +----------+-------------+----------+-----------+-------+ ICA siphon    50.00                 0.97            +----------+-------------+----------+-----------+-------+ Vertebral    -46.00                 0.97            +----------+-------------+----------+-----------+-------+ Distal ICA    22.00                 0.92            +----------+-------------+----------+-----------+-------+   +----------+------------+----------+-----------+-------+ LEFT TCD  Left VM (cm)Depth (cm)PulsatilityComment +----------+------------+----------+-----------+-------+ MCA          73.00                 0.99            +----------+------------+----------+-----------+-------+ ACA          -25.00                1.23            +----------+------------+----------+-----------+-------+ Term ICA     22.00                 0.70            +----------+------------+----------+-----------+-------+ PCA          23.00                 0.86            +----------+------------+----------+-----------+-------+ Opthalmic    16.00                 1.37            +----------+------------+----------+-----------+-------+ ICA siphon   43.00                 1.05            +----------+------------+----------+-----------+-------+ Vertebral    -17.00                0.84            +----------+------------+----------+-----------+-------+ Distal ICA   20.00                 0.98            +----------+------------+----------+-----------+-------+  +------------+------+-------+             VM cm Comment +------------+------+-------+ Prox Basilar-42.00        +------------+------+-------+ Dist Basilar-29.00        +------------+------+-------+ Summary:  Mildly elevated bilateral middle cerebral artery mean flow velocitie sof unclear significance.No definite evidence of vasospasm noted. *See table(s) above for TCD measurements and observations.  Diagnosing physician: Delia Heady MD Electronically signed by Delia Heady MD on 01/11/2022 at 12:41:44 PM.    Final    VAS [place] TRANSCRANIAL DOPPLER  Result Date: 01/11/2022  Transcranial Doppler Patient Name:  Judy Walsh  Date of Exam:   01/08/2022 Medical Rec #: [id number]  Accession #:    [id number] Date of Birth: [birth year]        Patient Gender: F Patient Age:   42 years Exam Location:  [facility] Procedure:      VAS [place]  TRANSCRANIAL DOPPLER Referring Phys: Verlin Dike --------------------------------------------------------------------------------  Indications: Subarachnoid hemorrhage. Performing Technologist: Jean Rosenthal RDMS, RVT  Examination Guidelines: A complete evaluation includes B-mode imaging, spectral Doppler, color Doppler, and power Doppler as needed of all accessible portions of each vessel. Bilateral testing is considered an integral part of a complete examination. Limited examinations for reoccurring indications may be performed as noted.  +----------+-------------+----------+-----------+------------------+ RIGHT TCD Right VM (cm)Depth (cm)Pulsatility     Comment       +----------+-------------+----------+-----------+------------------+ MCA           82.00                 0.93                       +----------+-------------+----------+-----------+------------------+ ACA                                         Unable to insonate +----------+-------------+----------+-----------+------------------+ Term ICA      56.00                 0.81                       +----------+-------------+----------+-----------+------------------+ PCA           40.00                 0.65                       +----------+-------------+----------+-----------+------------------+ Opthalmic     23.00                 1.10                       +----------+-------------+----------+-----------+------------------+ ICA siphon    54.00                 0.98                       +----------+-------------+----------+-----------+------------------+ Vertebral    -44.00                 0.72                       +----------+-------------+----------+-----------+------------------+ Distal ICA    28.00                 1.23                       +----------+-------------+----------+-----------+------------------+  +----------+------------+----------+-----------+-------+ LEFT TCD  Left VM (cm)Depth  (cm)PulsatilityComment +----------+------------+----------+-----------+-------+ MCA          73.00                 0.85            +----------+------------+----------+-----------+-------+ ACA          -28.00                0.52            +----------+------------+----------+-----------+-------+ Term ICA  30.00                 0.69            +----------+------------+----------+-----------+-------+ PCA          21.00                 0.75            +----------+------------+----------+-----------+-------+ Opthalmic    21.00                 1.11            +----------+------------+----------+-----------+-------+ ICA siphon   53.00                 0.74            +----------+------------+----------+-----------+-------+ Vertebral    -58.00                0.85            +----------+------------+----------+-----------+-------+ Distal ICA   39.00                 0.88            +----------+------------+----------+-----------+-------+  +------------+------+-------+             VM cm Comment +------------+------+-------+ Prox Basilar-58.00        +------------+------+-------+ Dist Basilar-71.00        +------------+------+-------+ +----------------------+----+ Right Lindegaard Ratio2.93 +----------------------+----+ +---------------------+----+ Left Lindegaard Ratio1.87 +---------------------+----+  Summary:  Mildly elevated bilateral middle cerebral and basilar artery mean flow velocitie sof unclear significance.No definite evidence of vasospasm noted. *See table(s) above for TCD measurements and observations.  Diagnosing physician: Delia Heady MD Electronically signed by Delia Heady MD on 01/11/2022 at 12:40:59 PM.    Final    VAS [place] TRANSCRANIAL DOPPLER  Result Date: 01/11/2022  Transcranial Doppler Patient Name:  Judy Walsh  Date of Exam:   01/07/2022 Medical Rec #: [id number]        Accession #:    [id number] Date of Birth: 23-Dec-1975         Patient Gender: F Patient Age:   89 years Exam Location:  [facility] Procedure:      VAS [place] TRANSCRANIAL DOPPLER Referring Phys: Verlin Dike --------------------------------------------------------------------------------  Indications: Subarachnoid hemorrhage. Limitations: Patient immobility, patient positioning, ventilator Limitations for diagnostic windows: Unable to insonate occipital window. Comparison Study: No prior studies. Performing Technologist: Chanda Busing RVT  Examination Guidelines: A complete evaluation includes B-mode imaging, spectral Doppler, color Doppler, and power Doppler as needed of all accessible portions of each vessel. Bilateral testing is considered an integral part of a complete examination. Limited examinations for reoccurring indications may be performed as noted.  +----------+-------------+----------+-----------+------------------+ RIGHT TCD Right VM (cm)Depth (cm)Pulsatility     Comment       +----------+-------------+----------+-----------+------------------+ MCA           28.00                   1                        +----------+-------------+----------+-----------+------------------+ ACA                                         Unable to insonate +----------+-------------+----------+-----------+------------------+ Term ICA      33.00  1                        +----------+-------------+----------+-----------+------------------+ PCA           36.00                  0.8                       +----------+-------------+----------+-----------+------------------+ Opthalmic     19.00                 1.14                       +----------+-------------+----------+-----------+------------------+ ICA siphon    19.00                 0.98                       +----------+-------------+----------+-----------+------------------+ Vertebral                                   Unable to insonate  +----------+-------------+----------+-----------+------------------+  +----------+------------+----------+-----------+------------------+ LEFT TCD  Left VM (cm)Depth (cm)Pulsatility     Comment       +----------+------------+----------+-----------+------------------+ MCA          39.00                 0.95                       +----------+------------+----------+-----------+------------------+ ACA          -31.00                0.99                       +----------+------------+----------+-----------+------------------+ Term ICA     19.00                 0.73                       +----------+------------+----------+-----------+------------------+ PCA          17.00                 0.96                       +----------+------------+----------+-----------+------------------+ Opthalmic    11.00                 1.12                       +----------+------------+----------+-----------+------------------+ ICA siphon   14.00                 1.14                       +----------+------------+----------+-----------+------------------+ Vertebral                                  Unable to insonate +----------+------------+----------+-----------+------------------+  +------------+-----+------------------+             VM cm     Comment       +------------+-----+------------------+ Prox Basilar     Unable to insonate +------------+-----+------------------+ Dist Basilar  Unable to insonate +------------+-----+------------------+ Summary:  Absent suboccipital and poor right temporal window limits exam. Low normal mean flow velocities in majority of identified vessels of anterior and posterior cerebral circulations. *See table(s) above for TCD measurements and observations.  Diagnosing physician: Delia Heady MD Electronically signed by Delia Heady MD on 01/11/2022 at 12:39:07 PM.    Final    DG Abd Portable 1V  Result Date: 01/08/2022 CLINICAL DATA:  Count for  feeding tube placement EXAM: PORTABLE ABDOMEN - 1 VIEW COMPARISON:  None Available. FINDINGS: Feeding tube with tip in the distal stomach. Tip directed towards the pyloric region. Normal bowel-gas pattern. IMPRESSION: Feeding tube in distal stomach. Electronically Signed   By: Genevive Bi M.D.   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 She appeared to have relatively rapid decline while in the emergency department with repeat CT scan confirming progressive hydrocephalus. After her transfer to [facility] external ventricular drain was placed. She appeared to improve neurologically. 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 She therefore presents for diagnostic cerebral angiogram and possible coil embolization of 1 or both aneurysms. ACCESS: The technical aspects of the procedure as well as its potential risks and benefits were reviewed with the patient's family including her son and daughter. These risks included but were not limited to stroke, intracranial hemorrhage, bleeding, infection, allergic reaction, damage to organs or vital structures, stroke, non-diagnostic procedure, and the catastrophic outcomes of heart attack, coma, and death. With an understanding of these risks, informed consent was obtained and witnessed. The patient was placed in the supine position on the angiography table and the skin of right groin prepped in the usual sterile fashion. The procedure was performed under general anesthesia. A 5-French sheath was introduced in the right common femoral artery using  Seldinger technique. MEDICATIONS: HEPARIN: 0 Units total (exclusive of heparinized saline flush) CONTRAST:  75mL OMNIPAQUE IOHEXOL 300 MG/ML SOLN, 25mL OMNIPAQUE IOHEXOL 300 MG/ML SOLNcc, Omnipaque 300 FLUOROSCOPY TIME:  FLUOROSCOPY TIME: See IR records TECHNIQUE: CATHETERS AND WIRES 5-French JB-1 catheter 180 cm 0.035" glidewire 6-French 90cm NeuronMax guide sheath 6-French Berenstein Select JB-1 catheter 0.058" CatV guidecatheter 150 cm Marksman microcatheter Synchro 2 select standard microwire Excelsior XT-17 microcatheter COILS USED Anterior communicating aneurysm: Target XL 360 soft 3 mm x 9 cm Posterior communicating artery aneurysm: Target XL 360 soft 4 mm x 8 cm Target Tetra 2.0 mm x 6 cm Target Tetra 2.0 mm x 4.5 cm Target Tetra 1.5 mm x 3 cm VESSELS CATHETERIZED Right internal carotid Left internal carotid Right vertebral Right common femoral Right anterior cerebral artery Left internal carotid artery VESSELS STUDIED Right internal carotid, head Left internal carotid, head Right vertebral Right anterior cerebral artery, head (pre-embolization) Right anterior cerebral artery, head (during embolization) Right internal carotid artery, head (immediate post-embolization) Right internal carotid artery, head (final control) Left internal carotid artery, head (pre embolization) Left internal carotid artery, head (during embolization) Left internal carotid artery, head (immediate post-embolization) Left internal carotid artery, head (final control) Right common femoral PROCEDURAL NARRATIVE A 5-Fr JB-1 glide catheter was advanced over a 0.035 glidewire into the aortic arch. The above vessels were then sequentially catheterized and cervical / cerebral angiograms taken.  After review of images, the catheter was removed without incident. The 5-Fr sheath was then exchanged over the glidewire for an 8-Fr sheath. Under real-time fluoroscopy, the guide sheath was advanced over the select catheter and glidewire into the  descending aorta. The select catheter was then advanced into the right internal carotid artery. The guide sheath was then advanced into the proximal segment of the cervical right internal carotid artery. The Select catheter was removed and the 058 guide catheter was coaxially introduced over the 27 microcatheter and microwire. The microcatheter was then advanced under roadmap guidance into the distal right anterior cerebral artery. The guide catheter was then tracked over the microcatheter to its final position in the proximal right A1. The microcatheter was then removed and the coiling catheter introduced over the microwire. The catheter was then navigated into the aneurysm lumen over the microwire. The wire was then removed. The above coils were then sequentially deployed with progressive exclusion of the aneurysm. Angiogram was taken immediately prior, after placement of the first coil, and immediately after coiling. The microcatheter was then removed and the guide catheter withdrawn down into the distal cervical internal carotid artery. Final control angiogram was taken. The guide catheter was then removed and the guide sheath was withdrawn down into the descending aorta. The Berenstein catheter was then introduced over the Glidewire and the left internal carotid artery was selected. The guide sheath was then advanced into the proximal cervical left internal carotid artery. Berenstein catheter was removed and again, the guide catheter was introduced over the 27 microcatheter and microwire. Under roadmap guidance, the microcatheter was advanced ultimately into the M2 segment. This allowed advancement of the guide catheter into the proximal supraclinoid internal carotid artery, just proximal to the origin of the posterior communicating artery. The 27 microcatheter was then removed and the coiling catheter was again introduced over the microwire. The aneurysm lumen was then carefully catheterized. The above coils  were then sequentially deployed with angiograms taken during coiling. After placement of the final coil, the microcatheter was removed without incident. The guide catheter was again withdrawn down into the distal cervical internal carotid artery and final control angiogram was taken. The guide catheter and guide sheath were then synchronously removed without incident. FINDINGS: Right internal carotid, head: Injection reveals the presence of a widely patent ICA, M1, and A1 segments and their branches. Note is made of a fetal type right posterior cerebral artery. There is a leftward projecting aneurysm arising from the anterior communicating artery proper. This aneurysm measures 4.5 x 3.0 mm. Note is made of triplicate A2 segments. No vasospasm is seen. The parenchymal and venous phases are normal. The venous sinuses are widely patent. Left internal carotid, head: Injection reveals the presence of a widely patent ICA, A1, and M1 segments and their branches. There is a bilobed aneurysm arising at the origin of the left posterior communicating artery. Aneurysm measures approximately 5.6 x 3.4 mm. The aneurysm neck appears to be somewhat distinct from the origin of the left posterior communicating artery. No other aneurysms of the left carotid circulation are noted. Also of note, the above described anterior communicating artery aneurysm is not well visualized. The parenchymal and venous phases are normal. The venous sinuses are widely patent. Right vertebral: Injection reveals the presence of a widely patent vertebral artery. There is flash filling of the contralateral left vertebral artery which appears to be non dominant. The origin of the left PICA is seen without any aneurysms noted. This leads to  a widely patent basilar artery that terminates in left P1. The right P1 is hypoplastic consistent with the fetal type posterior cerebral artery seen on right carotid injection. The basilar apex is normal. No aneurysms,  AVMs, or high-flow fistulas are seen. There is no vasospasm of the posterior circulation. The parenchymal and venous phases are normal. The venous sinuses are widely patent. Right anterior cerebral artery, head (pre embolization) Again seen is the leftward projecting aneurysm arising from the anterior communicating artery in the setting of triple A2 segments. Right anterior cerebral artery, head (during embolization): Injection reveals the presence of a widely patent A1 and all 3 A2 segments. Coil mass within the aneurysm is stable, without coil prolapse or filling defect to suggest thrombus. Right internal carotid artery, head (immediate post embolization): Injection reveals the presence of a widely patent distal internal carotid artery. The ICA bifurcation is normal. Coil mass appears to be stable within the aneurysm. There is good filling of the distal 3 A2 segments. No filling defects are noted. Right internal carotid artery, head (final control): Injection reveals the presence of a widely patent ICA that leads to a patent ACA and MCA. No thrombus is visualized. Coil mass is seen within the aneurysm and is in stable position. Capillary phase does not demonstrate any perfusion deficits. Venous sinuses remain widely patent. 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 The origin of the left posterior communicating artery appears distinct from the aneurysm neck. The anterior choroidal artery is also visualized. Left internal carotid artery, head (during embolization) Angiograms taken during coil embolization of the posterior communicating artery aneurysm confirm coil mass to remain within the aneurysm. No coil prolapse is noted. The posterior communicating artery remains patent, as does the anterior choroidal artery. No filling defects are noted  to suggest thrombus formation. Left internal carotid artery, head (immediate post-embolization): Injection reveals the presence of a widely patent ICA that leads to a patent ACA and MCA. Coil mass within the aneurysm is stable, without coil prolapse or filling defect to suggest thrombus. There is minimal aneurysm residual filling, with significant contrast stasis extending well past the venous phase. Left internal carotid artery, head (final control): Injection reveals the presence of a widely patent ICA that leads to a patent ACA and MCA. No thrombus is visualized. Coil mass is seen within the aneurysm and is in stable position. The parenchymal and venous phases are unremarkable. Right femoral: Normal vessel. No significant atherosclerotic disease. Arterial sheath in adequate position. DISPOSITION: Upon completion of the study, the femoral sheath was removed and hemostasis obtained using manual compression. Good proximal and distal lower extremity pulses were documented upon achievement of hemostasis. The procedure was well tolerated and no early complications were observed. The patient was transferred to the neuro intensive care unit for further care. IMPRESSION: 1. 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 Minimal aneurysm residual is noted at both locations, with significant contrast stasis in the left posterior communicating artery aneurysm. 2. No other intracranial aneurysms, arteriovenous malformations, or fistulas are noted. There is no vasospasm noted. The preliminary results of this procedure were shared with the patient's family. Electronically Signed   By: Lisbeth Renshaw   On: 01/07/2022 15:41   IR Angiogram Follow Up Study  Result Date: 01/07/2022 PROCEDURE: DIAGNOSTIC CEREBRAL ANGIOGRAM COIL EMBOLIZATION OF ANTERIOR COMMUNICATING ARTERY ANEURYSM COIL  EMBOLIZATION OF LEFT POSTERIOR COMMUNICATING ARTERY ANEURYSM  HISTORY: The patient is a 46 year old woman initially presenting to the hospital with sudden onset of severe headache. She appeared to have relatively rapid decline while in the emergency department with repeat CT scan confirming progressive hydrocephalus. After her transfer to [facility] external ventricular drain was placed. She appeared to improve neurologically. 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 She therefore presents for diagnostic cerebral angiogram and possible coil embolization of 1 or both aneurysms. ACCESS: The technical aspects of the procedure as well as its potential risks and benefits were reviewed with the patient's family including her son and daughter. These risks included but were not limited to stroke, intracranial hemorrhage, bleeding, infection, allergic reaction, damage to organs or vital structures, stroke, non-diagnostic procedure, and the catastrophic outcomes of heart attack, coma, and death. With an understanding of these risks, informed consent was obtained and witnessed. The patient was placed in the supine position on the angiography table and the skin of right groin prepped in the usual sterile fashion. The procedure was performed under general anesthesia. A 5-French sheath was introduced in the right common femoral artery using Seldinger technique. MEDICATIONS: HEPARIN: 0 Units total (exclusive of heparinized saline flush) CONTRAST:  66mL OMNIPAQUE IOHEXOL 300 MG/ML SOLN, 87mL OMNIPAQUE IOHEXOL 300 MG/ML SOLNcc, Omnipaque 300 FLUOROSCOPY TIME:  FLUOROSCOPY TIME: See IR records TECHNIQUE: CATHETERS AND WIRES 5-French JB-1 catheter 180 cm 0.035" glidewire 6-French 90cm NeuronMax guide sheath 6-French Berenstein Select JB-1 catheter 0.058" CatV guidecatheter 150 cm Marksman microcatheter Synchro 2 select standard microwire Excelsior XT-17 microcatheter COILS USED  Anterior communicating aneurysm: Target XL 360 soft 3 mm x 9 cm Posterior communicating artery aneurysm: Target XL 360 soft 4 mm x 8 cm Target Tetra 2.0 mm x 6 cm Target Tetra 2.0 mm x 4.5 cm Target Tetra 1.5 mm x 3 cm VESSELS CATHETERIZED Right internal carotid Left internal carotid Right vertebral Right common femoral Right anterior cerebral artery Left internal carotid artery VESSELS STUDIED Right internal carotid, head Left internal carotid, head Right vertebral Right anterior cerebral artery, head (pre-embolization) Right anterior cerebral artery, head (during embolization) Right internal carotid artery, head (immediate post-embolization) Right internal carotid artery, head (final control) Left internal carotid artery, head (pre embolization) Left internal carotid artery, head (during embolization) Left internal carotid artery, head (immediate post-embolization) Left internal carotid artery, head (final control) Right common femoral PROCEDURAL NARRATIVE A 5-Fr JB-1 glide catheter was advanced over a 0.035 glidewire into the aortic arch. The above vessels were then sequentially catheterized and cervical / cerebral angiograms taken. After review of images, the catheter was removed without incident. The 5-Fr sheath was then exchanged over the glidewire for an 8-Fr sheath. Under real-time fluoroscopy, the guide sheath was advanced over the select catheter and glidewire into the descending aorta. The select catheter was then advanced into the right internal carotid artery. The guide sheath was then advanced into the proximal segment of the cervical right internal carotid artery. The Select catheter was removed and the 058 guide catheter was coaxially introduced over the 27 microcatheter and microwire. The microcatheter was then advanced under roadmap guidance into the distal right anterior cerebral artery. The guide catheter was then tracked over the microcatheter to its final position in the proximal right A1. The  microcatheter was then removed and the coiling catheter introduced over the microwire. The catheter was then navigated into the aneurysm lumen over the  microwire. The wire was then removed. The above coils were then sequentially deployed with progressive exclusion of the aneurysm. Angiogram was taken immediately prior, after placement of the first coil, and immediately after coiling. The microcatheter was then removed and the guide catheter withdrawn down into the distal cervical internal carotid artery. Final control angiogram was taken. The guide catheter was then removed and the guide sheath was withdrawn down into the descending aorta. The Berenstein catheter was then introduced over the Glidewire and the left internal carotid artery was selected. The guide sheath was then advanced into the proximal cervical left internal carotid artery. Berenstein catheter was removed and again, the guide catheter was introduced over the 27 microcatheter and microwire. Under roadmap guidance, the microcatheter was advanced ultimately into the M2 segment. This allowed advancement of the guide catheter into the proximal supraclinoid internal carotid artery, just proximal to the origin of the posterior communicating artery. The 27 microcatheter was then removed and the coiling catheter was again introduced over the microwire. The aneurysm lumen was then carefully catheterized. The above coils were then sequentially deployed with angiograms taken during coiling. After placement of the final coil, the microcatheter was removed without incident. The guide catheter was again withdrawn down into the distal cervical internal carotid artery and final control angiogram was taken. The guide catheter and guide sheath were then synchronously removed without incident. FINDINGS: Right internal carotid, head: Injection reveals the presence of a widely patent ICA, M1, and A1 segments and their branches. Note is made of a fetal type right  posterior cerebral artery. There is a leftward projecting aneurysm arising from the anterior communicating artery proper. This aneurysm measures 4.5 x 3.0 mm. Note is made of triplicate A2 segments. No vasospasm is seen. The parenchymal and venous phases are normal. The venous sinuses are widely patent. Left internal carotid, head: Injection reveals the presence of a widely patent ICA, A1, and M1 segments and their branches. There is a bilobed aneurysm arising at the origin of the left posterior communicating artery. Aneurysm measures approximately 5.6 x 3.4 mm. The aneurysm neck appears to be somewhat distinct from the origin of the left posterior communicating artery. No other aneurysms of the left carotid circulation are noted. Also of note, the above described anterior communicating artery aneurysm is not well visualized. The parenchymal and venous phases are normal. The venous sinuses are widely patent. Right vertebral: Injection reveals the presence of a widely patent vertebral artery. There is flash filling of the contralateral left vertebral artery which appears to be non dominant. The origin of the left PICA is seen without any aneurysms noted. This leads to a widely patent basilar artery that terminates in left P1. The right P1 is hypoplastic consistent with the fetal type posterior cerebral artery seen on right carotid injection. The basilar apex is normal. No aneurysms, AVMs, or high-flow fistulas are seen. There is no vasospasm of the posterior circulation. The parenchymal and venous phases are normal. The venous sinuses are widely patent. Right anterior cerebral artery, head (pre embolization) Again seen is the leftward projecting aneurysm arising from the anterior communicating artery in the setting of triple A2 segments. Right anterior cerebral artery, head (during embolization): Injection reveals the presence of a widely patent A1 and all 3 A2 segments. Coil mass within the aneurysm is stable,  without coil prolapse or filling defect to suggest thrombus. Right internal carotid artery, head (immediate post embolization): Injection reveals the presence of a widely patent distal internal carotid artery.  The ICA bifurcation is normal. Coil mass appears to be stable within the aneurysm. There is good filling of the distal 3 A2 segments. No filling defects are noted. Right internal carotid artery, head (final control): Injection reveals the presence of a widely patent ICA that leads to a patent ACA and MCA. No thrombus is visualized. Coil mass is seen within the aneurysm and is in stable position. Capillary phase does not demonstrate any perfusion deficits. Venous sinuses remain widely patent. 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 The origin of the left posterior communicating artery appears distinct from the aneurysm neck. The anterior choroidal artery is also visualized. Left internal carotid artery, head (during embolization) Angiograms taken during coil embolization of the posterior communicating artery aneurysm confirm coil mass to remain within the aneurysm. No coil prolapse is noted. The posterior communicating artery remains patent, as does the anterior choroidal artery. No filling defects are noted to suggest thrombus formation. Left internal carotid artery, head (immediate post-embolization): Injection reveals the presence of a widely patent ICA that leads to a patent ACA and MCA. Coil mass within the aneurysm is stable, without coil prolapse or filling defect to suggest thrombus. There is minimal aneurysm residual filling, with significant contrast stasis extending well past the venous phase. Left internal carotid artery, head (final control): Injection reveals the presence of a widely patent ICA that leads to a patent ACA and  MCA. No thrombus is visualized. Coil mass is seen within the aneurysm and is in stable position. The parenchymal and venous phases are unremarkable. Right femoral: Normal vessel. No significant atherosclerotic disease. Arterial sheath in adequate position. DISPOSITION: Upon completion of the study, the femoral sheath was removed and hemostasis obtained using manual compression. Good proximal and distal lower extremity pulses were documented upon achievement of hemostasis. The procedure was well tolerated and no early complications were observed. The patient was transferred to the neuro intensive care unit for further care. IMPRESSION: 1. 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 Minimal aneurysm residual is noted at both locations, with significant contrast stasis in the left posterior communicating artery aneurysm. 2. No other intracranial aneurysms, arteriovenous malformations, or fistulas are noted. There is no vasospasm noted. The preliminary results of this procedure were shared with the patient's family. Electronically Signed   By: Lisbeth Renshaw   On: 01/07/2022 15:41   IR ANGIO INTRA EXTRACRAN SEL INTERNAL CAROTID BILAT MOD SED  Result Date: 01/07/2022 PROCEDURE: DIAGNOSTIC CEREBRAL ANGIOGRAM COIL EMBOLIZATION OF ANTERIOR COMMUNICATING ARTERY ANEURYSM COIL EMBOLIZATION OF LEFT POSTERIOR COMMUNICATING ARTERY ANEURYSM HISTORY: The patient is a 46 year old woman initially presenting to the hospital with sudden onset of severe headache. She appeared to have relatively rapid decline while in the emergency department with repeat CT scan confirming progressive hydrocephalus. After her transfer to [facility] external ventricular drain was placed. She appeared to improve neurologically. Her CT angiogram did reveal both anterior communicating and posterior communicating artery aneurysms, with relatively diffuse  subarachnoid hemorrhage. She therefore presents for diagnostic cerebral angiogram and possible coil embolization of 1 or both aneurysms. ACCESS: The technical aspects of the procedure as well as its potential risks and benefits were reviewed with the patient's family including her son and daughter. These risks included but were not limited to stroke, intracranial hemorrhage, bleeding, infection, allergic reaction, damage to organs or vital  structures, stroke, non-diagnostic procedure, and the catastrophic outcomes of heart attack, coma, and death. With an understanding of these risks, informed consent was obtained and witnessed. The patient was placed in the supine position on the angiography table and the skin of right groin prepped in the usual sterile fashion. The procedure was performed under general anesthesia. A 5-French sheath was introduced in the right common femoral artery using Seldinger technique. MEDICATIONS: HEPARIN: 0 Units total (exclusive of heparinized saline flush) CONTRAST:  75mL OMNIPAQUE IOHEXOL 300 MG/ML SOLN, 25mL OMNIPAQUE IOHEXOL 300 MG/ML SOLNcc, Omnipaque 300 FLUOROSCOPY TIME:  FLUOROSCOPY TIME: See IR records TECHNIQUE: CATHETERS AND WIRES 5-French JB-1 catheter 180 cm 0.035" glidewire 6-French 90cm NeuronMax guide sheath 6-French Berenstein Select JB-1 catheter 0.058" CatV guidecatheter 150 cm Marksman microcatheter Synchro 2 select standard microwire Excelsior XT-17 microcatheter COILS USED Anterior communicating aneurysm: Target XL 360 soft 3 mm x 9 cm Posterior communicating artery aneurysm: Target XL 360 soft 4 mm x 8 cm Target Tetra 2.0 mm x 6 cm Target Tetra 2.0 mm x 4.5 cm Target Tetra 1.5 mm x 3 cm VESSELS CATHETERIZED Right internal carotid Left internal carotid Right vertebral Right common femoral Right anterior cerebral artery Left internal carotid artery VESSELS STUDIED Right internal carotid, head Left internal carotid, head Right vertebral Right anterior cerebral artery,  head (pre-embolization) Right anterior cerebral artery, head (during embolization) Right internal carotid artery, head (immediate post-embolization) Right internal carotid artery, head (final control) Left internal carotid artery, head (pre embolization) Left internal carotid artery, head (during embolization) Left internal carotid artery, head (immediate post-embolization) Left internal carotid artery, head (final control) Right common femoral PROCEDURAL NARRATIVE A 5-Fr JB-1 glide catheter was advanced over a 0.035 glidewire into the aortic arch. The above vessels were then sequentially catheterized and cervical / cerebral angiograms taken. After review of images, the catheter was removed without incident. The 5-Fr sheath was then exchanged over the glidewire for an 8-Fr sheath. Under real-time fluoroscopy, the guide sheath was advanced over the select catheter and glidewire into the descending aorta. The select catheter was then advanced into the right internal carotid artery. The guide sheath was then advanced into the proximal segment of the cervical right internal carotid artery. The Select catheter was removed and the 058 guide catheter was coaxially introduced over the 27 microcatheter and microwire. The microcatheter was then advanced under roadmap guidance into the distal right anterior cerebral artery. The guide catheter was then tracked over the microcatheter to its final position in the proximal right A1. The microcatheter was then removed and the coiling catheter introduced over the microwire. The catheter was then navigated into the aneurysm lumen over the microwire. The wire was then removed. The above coils were then sequentially deployed with progressive exclusion of the aneurysm. Angiogram was taken immediately prior, after placement of the first coil, and immediately after coiling. The microcatheter was then removed and the guide catheter withdrawn down into the distal cervical internal carotid  artery. Final control angiogram was taken. The guide catheter was then removed and the guide sheath was withdrawn down into the descending aorta. The Berenstein catheter was then introduced over the Glidewire and the left internal carotid artery was selected. The guide sheath was then advanced into the proximal cervical left internal carotid artery. Berenstein catheter was removed and again, the guide catheter was introduced over the 27 microcatheter and microwire. Under roadmap guidance, the microcatheter was advanced ultimately into the M2 segment. This allowed advancement of the guide catheter into  the proximal supraclinoid internal carotid artery, just proximal to the origin of the posterior communicating artery. The 27 microcatheter was then removed and the coiling catheter was again introduced over the microwire. The aneurysm lumen was then carefully catheterized. The above coils were then sequentially deployed with angiograms taken during coiling. After placement of the final coil, the microcatheter was removed without incident. The guide catheter was again withdrawn down into the distal cervical internal carotid artery and final control angiogram was taken. The guide catheter and guide sheath were then synchronously removed without incident. FINDINGS: Right internal carotid, head: Injection reveals the presence of a widely patent ICA, M1, and A1 segments and their branches. Note is made of a fetal type right posterior cerebral artery. There is a leftward projecting aneurysm arising from the anterior communicating artery proper. This aneurysm measures 4.5 x 3.0 mm. Note is made of triplicate A2 segments. No vasospasm is seen. The parenchymal and venous phases are normal. The venous sinuses are widely patent. Left internal carotid, head: Injection reveals the presence of a widely patent ICA, A1, and M1 segments and their branches. There is a bilobed aneurysm arising at the origin of the left posterior  communicating artery. Aneurysm measures approximately 5.6 x 3.4 mm. The aneurysm neck appears to be somewhat distinct from the origin of the left posterior communicating artery. No other aneurysms of the left carotid circulation are noted. Also of note, the above described anterior communicating artery aneurysm is not well visualized. The parenchymal and venous phases are normal. The venous sinuses are widely patent. Right vertebral: Injection reveals the presence of a widely patent vertebral artery. There is flash filling of the contralateral left vertebral artery which appears to be non dominant. The origin of the left PICA is seen without any aneurysms noted. This leads to a widely patent basilar artery that terminates in left P1. The right P1 is hypoplastic consistent with the fetal type posterior cerebral artery seen on right carotid injection. The basilar apex is normal. No aneurysms, AVMs, or high-flow fistulas are seen. There is no vasospasm of the posterior circulation. The parenchymal and venous phases are normal. The venous sinuses are widely patent. Right anterior cerebral artery, head (pre embolization) Again seen is the leftward projecting aneurysm arising from the anterior communicating artery in the setting of triple A2 segments. Right anterior cerebral artery, head (during embolization): Injection reveals the presence of a widely patent A1 and all 3 A2 segments. Coil mass within the aneurysm is stable, without coil prolapse or filling defect to suggest thrombus. Right internal carotid artery, head (immediate post embolization): Injection reveals the presence of a widely patent distal internal carotid artery. The ICA bifurcation is normal. Coil mass appears to be stable within the aneurysm. There is good filling of the distal 3 A2 segments. No filling defects are noted. Right internal carotid artery, head (final control): Injection reveals the presence of a widely patent ICA that leads to a patent  ACA and MCA. No thrombus is visualized. Coil mass is seen within the aneurysm and is in stable position. Capillary phase does not demonstrate any perfusion deficits. Venous sinuses remain widely patent. 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 The origin of the left posterior communicating artery appears distinct from the aneurysm neck. The anterior choroidal artery is also visualized. Left internal carotid artery, head (during embolization) Angiograms taken during coil embolization  of the posterior communicating artery aneurysm confirm coil mass to remain within the aneurysm. No coil prolapse is noted. The posterior communicating artery remains patent, as does the anterior choroidal artery. No filling defects are noted to suggest thrombus formation. Left internal carotid artery, head (immediate post-embolization): Injection reveals the presence of a widely patent ICA that leads to a patent ACA and MCA. Coil mass within the aneurysm is stable, without coil prolapse or filling defect to suggest thrombus. There is minimal aneurysm residual filling, with significant contrast stasis extending well past the venous phase. Left internal carotid artery, head (final control): Injection reveals the presence of a widely patent ICA that leads to a patent ACA and MCA. No thrombus is visualized. Coil mass is seen within the aneurysm and is in stable position. The parenchymal and venous phases are unremarkable. Right femoral: Normal vessel. No significant atherosclerotic disease. Arterial sheath in adequate position. DISPOSITION: Upon completion of the study, the femoral sheath was removed and hemostasis obtained using manual compression. Good proximal and distal lower extremity pulses were documented upon achievement of hemostasis. The procedure was well  tolerated and no early complications were observed. The patient was transferred to the neuro intensive care unit for further care. IMPRESSION: 1. 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 Minimal aneurysm residual is noted at both locations, with significant contrast stasis in the left posterior communicating artery aneurysm. 2. No other intracranial aneurysms, arteriovenous malformations, or fistulas are noted. There is no vasospasm noted. The preliminary results of this procedure were shared with the patient's family. Electronically Signed   By: Lisbeth Renshaw   On: 01/07/2022 15:41   IR Angiogram Follow Up Study  Result Date: 01/07/2022 PROCEDURE: DIAGNOSTIC CEREBRAL ANGIOGRAM COIL EMBOLIZATION OF ANTERIOR COMMUNICATING ARTERY ANEURYSM COIL EMBOLIZATION OF LEFT POSTERIOR COMMUNICATING ARTERY ANEURYSM HISTORY: The patient is a 46 year old woman initially presenting to the hospital with sudden onset of severe headache. She appeared to have relatively rapid decline while in the emergency department with repeat CT scan confirming progressive hydrocephalus. After her transfer to [facility] external ventricular drain was placed. She appeared to improve neurologically. 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 She therefore presents for diagnostic cerebral angiogram and possible coil embolization of 1 or both aneurysms. ACCESS: The technical aspects of the procedure as well as its potential risks and benefits were reviewed with the patient's family including her son and daughter. These risks included but were not limited to stroke, intracranial hemorrhage, bleeding, infection, allergic reaction, damage to organs or vital structures, stroke, non-diagnostic procedure, and the catastrophic outcomes of heart attack,  coma, and death. With an understanding of these risks, informed consent was obtained and witnessed. The patient was placed in the supine position on the angiography table and the skin of right groin prepped in the usual sterile fashion. The procedure was performed under general anesthesia. A 5-French sheath was introduced in the right common femoral artery using Seldinger technique. MEDICATIONS: HEPARIN: 0 Units total (exclusive of heparinized saline flush) CONTRAST:  75mL OMNIPAQUE IOHEXOL 300 MG/ML SOLN, 25mL OMNIPAQUE IOHEXOL 300 MG/ML SOLNcc, Omnipaque 300 FLUOROSCOPY TIME:  FLUOROSCOPY TIME: See IR records TECHNIQUE: CATHETERS AND WIRES 5-French JB-1 catheter 180 cm 0.035" glidewire 6-French 90cm NeuronMax guide sheath 6-French Berenstein Select JB-1 catheter 0.058" CatV guidecatheter 150 cm Marksman microcatheter Synchro 2 select standard microwire Excelsior XT-17 microcatheter COILS USED Anterior communicating aneurysm: Target  XL 360 soft 3 mm x 9 cm Posterior communicating artery aneurysm: Target XL 360 soft 4 mm x 8 cm Target Tetra 2.0 mm x 6 cm Target Tetra 2.0 mm x 4.5 cm Target Tetra 1.5 mm x 3 cm VESSELS CATHETERIZED Right internal carotid Left internal carotid Right vertebral Right common femoral Right anterior cerebral artery Left internal carotid artery VESSELS STUDIED Right internal carotid, head Left internal carotid, head Right vertebral Right anterior cerebral artery, head (pre-embolization) Right anterior cerebral artery, head (during embolization) Right internal carotid artery, head (immediate post-embolization) Right internal carotid artery, head (final control) Left internal carotid artery, head (pre embolization) Left internal carotid artery, head (during embolization) Left internal carotid artery, head (immediate post-embolization) Left internal carotid artery, head (final control) Right common femoral PROCEDURAL NARRATIVE A 5-Fr JB-1 glide catheter was advanced over a 0.035 glidewire into  the aortic arch. The above vessels were then sequentially catheterized and cervical / cerebral angiograms taken. After review of images, the catheter was removed without incident. The 5-Fr sheath was then exchanged over the glidewire for an 8-Fr sheath. Under real-time fluoroscopy, the guide sheath was advanced over the select catheter and glidewire into the descending aorta. The select catheter was then advanced into the right internal carotid artery. The guide sheath was then advanced into the proximal segment of the cervical right internal carotid artery. The Select catheter was removed and the 058 guide catheter was coaxially introduced over the 27 microcatheter and microwire. The microcatheter was then advanced under roadmap guidance into the distal right anterior cerebral artery. The guide catheter was then tracked over the microcatheter to its final position in the proximal right A1. The microcatheter was then removed and the coiling catheter introduced over the microwire. The catheter was then navigated into the aneurysm lumen over the microwire. The wire was then removed. The above coils were then sequentially deployed with progressive exclusion of the aneurysm. Angiogram was taken immediately prior, after placement of the first coil, and immediately after coiling. The microcatheter was then removed and the guide catheter withdrawn down into the distal cervical internal carotid artery. Final control angiogram was taken. The guide catheter was then removed and the guide sheath was withdrawn down into the descending aorta. The Berenstein catheter was then introduced over the Glidewire and the left internal carotid artery was selected. The guide sheath was then advanced into the proximal cervical left internal carotid artery. Berenstein catheter was removed and again, the guide catheter was introduced over the 27 microcatheter and microwire. Under roadmap guidance, the microcatheter was advanced ultimately  into the M2 segment. This allowed advancement of the guide catheter into the proximal supraclinoid internal carotid artery, just proximal to the origin of the posterior communicating artery. The 27 microcatheter was then removed and the coiling catheter was again introduced over the microwire. The aneurysm lumen was then carefully catheterized. The above coils were then sequentially deployed with angiograms taken during coiling. After placement of the final coil, the microcatheter was removed without incident. The guide catheter was again withdrawn down into the distal cervical internal carotid artery and final control angiogram was taken. The guide catheter and guide sheath were then synchronously removed without incident. FINDINGS: Right internal carotid, head: Injection reveals the presence of a widely patent ICA, M1, and A1 segments and their branches. Note is made of a fetal type right posterior cerebral artery. There is a leftward projecting aneurysm arising from the anterior communicating artery proper. This aneurysm measures 4.5 x 3.0 mm.  Note is made of triplicate A2 segments. No vasospasm is seen. The parenchymal and venous phases are normal. The venous sinuses are widely patent. Left internal carotid, head: Injection reveals the presence of a widely patent ICA, A1, and M1 segments and their branches. There is a bilobed aneurysm arising at the origin of the left posterior communicating artery. Aneurysm measures approximately 5.6 x 3.4 mm. The aneurysm neck appears to be somewhat distinct from the origin of the left posterior communicating artery. No other aneurysms of the left carotid circulation are noted. Also of note, the above described anterior communicating artery aneurysm is not well visualized. The parenchymal and venous phases are normal. The venous sinuses are widely patent. Right vertebral: Injection reveals the presence of a widely patent vertebral artery. There is flash filling of the  contralateral left vertebral artery which appears to be non dominant. The origin of the left PICA is seen without any aneurysms noted. This leads to a widely patent basilar artery that terminates in left P1. The right P1 is hypoplastic consistent with the fetal type posterior cerebral artery seen on right carotid injection. The basilar apex is normal. No aneurysms, AVMs, or high-flow fistulas are seen. There is no vasospasm of the posterior circulation. The parenchymal and venous phases are normal. The venous sinuses are widely patent. Right anterior cerebral artery, head (pre embolization) Again seen is the leftward projecting aneurysm arising from the anterior communicating artery in the setting of triple A2 segments. Right anterior cerebral artery, head (during embolization): Injection reveals the presence of a widely patent A1 and all 3 A2 segments. Coil mass within the aneurysm is stable, without coil prolapse or filling defect to suggest thrombus. Right internal carotid artery, head (immediate post embolization): Injection reveals the presence of a widely patent distal internal carotid artery. The ICA bifurcation is normal. Coil mass appears to be stable within the aneurysm. There is good filling of the distal 3 A2 segments. No filling defects are noted. Right internal carotid artery, head (final control): Injection reveals the presence of a widely patent ICA that leads to a patent ACA and MCA. No thrombus is visualized. Coil mass is seen within the aneurysm and is in stable position. Capillary phase does not demonstrate any perfusion deficits. Venous sinuses remain widely patent. 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 The origin of the left posterior communicating artery appears distinct from the aneurysm neck. The anterior choroidal  artery is also visualized. Left internal carotid artery, head (during embolization) Angiograms taken during coil embolization of the posterior communicating artery aneurysm confirm coil mass to remain within the aneurysm. No coil prolapse is noted. The posterior communicating artery remains patent, as does the anterior choroidal artery. No filling defects are noted to suggest thrombus formation. Left internal carotid artery, head (immediate post-embolization): Injection reveals the presence of a widely patent ICA that leads to a patent ACA and MCA. Coil mass within the aneurysm is stable, without coil prolapse or filling defect to suggest thrombus. There is minimal aneurysm residual filling, with significant contrast stasis extending well past the venous phase. Left internal carotid artery, head (final control): Injection reveals the presence of a widely patent ICA that leads to a patent ACA and MCA. No thrombus is visualized. Coil mass is seen within the aneurysm and is in stable position. The parenchymal and venous phases are unremarkable. Right femoral: Normal vessel.  No significant atherosclerotic disease. Arterial sheath in adequate position. DISPOSITION: Upon completion of the study, the femoral sheath was removed and hemostasis obtained using manual compression. Good proximal and distal lower extremity pulses were documented upon achievement of hemostasis. The procedure was well tolerated and no early complications were observed. The patient was transferred to the neuro intensive care unit for further care. IMPRESSION: 1. 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 Minimal aneurysm residual is noted at both locations, with significant contrast stasis in the left posterior communicating artery aneurysm. 2. No other intracranial aneurysms, arteriovenous malformations, or fistulas are noted. There is no vasospasm noted. The  preliminary results of this procedure were shared with the patient's family. Electronically Signed   By: Lisbeth Renshaw   On: 01/07/2022 15:41   IR Angiogram Follow Up Study  Result Date: 01/07/2022 PROCEDURE: DIAGNOSTIC CEREBRAL ANGIOGRAM COIL EMBOLIZATION OF ANTERIOR COMMUNICATING ARTERY ANEURYSM COIL EMBOLIZATION OF LEFT POSTERIOR COMMUNICATING ARTERY ANEURYSM HISTORY: The patient is a 46 year old woman initially presenting to the hospital with sudden onset of severe headache. She appeared to have relatively rapid decline while in the emergency department with repeat CT scan confirming progressive hydrocephalus. After her transfer to [facility] external ventricular drain was placed. She appeared to improve neurologically. 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 She therefore presents for diagnostic cerebral angiogram and possible coil embolization of 1 or both aneurysms. ACCESS: The technical aspects of the procedure as well as its potential risks and benefits were reviewed with the patient's family including her son and daughter. These risks included but were not limited to stroke, intracranial hemorrhage, bleeding, infection, allergic reaction, damage to organs or vital structures, stroke, non-diagnostic procedure, and the catastrophic outcomes of heart attack, coma, and death. With an understanding of these risks, informed consent was obtained and witnessed. The patient was placed in the supine position on the angiography table and the skin of right groin prepped in the usual sterile fashion. The procedure was performed under general anesthesia. A 5-French sheath was introduced in the right common femoral artery using Seldinger technique. MEDICATIONS: HEPARIN: 0 Units total (exclusive of heparinized saline flush) CONTRAST:  68mL OMNIPAQUE IOHEXOL 300 MG/ML SOLN, 40mL OMNIPAQUE IOHEXOL  300 MG/ML SOLNcc, Omnipaque 300 FLUOROSCOPY TIME:  FLUOROSCOPY TIME: See IR records TECHNIQUE: CATHETERS AND WIRES 5-French JB-1 catheter 180 cm 0.035" glidewire 6-French 90cm NeuronMax guide sheath 6-French Berenstein Select JB-1 catheter 0.058" CatV guidecatheter 150 cm Marksman microcatheter Synchro 2 select standard microwire Excelsior XT-17 microcatheter COILS USED Anterior communicating aneurysm: Target XL 360 soft 3 mm x 9 cm Posterior communicating artery aneurysm: Target XL 360 soft 4 mm x 8 cm Target Tetra 2.0 mm x 6 cm Target Tetra 2.0 mm x 4.5 cm Target Tetra 1.5 mm x 3 cm VESSELS CATHETERIZED Right internal carotid Left internal carotid Right vertebral Right common femoral Right anterior cerebral artery Left internal carotid artery VESSELS STUDIED Right internal carotid, head Left internal carotid, head Right vertebral Right anterior cerebral artery, head (pre-embolization) Right anterior cerebral artery, head (during embolization) Right internal carotid artery, head (immediate post-embolization) Right internal carotid artery, head (final control) Left internal carotid artery, head (pre embolization) Left internal carotid artery, head (during embolization) Left internal carotid artery, head (immediate post-embolization) Left internal carotid artery, head (final control) Right common femoral PROCEDURAL NARRATIVE A 5-Fr JB-1 glide catheter was advanced over a 0.035 glidewire into  the aortic arch. The above vessels were then sequentially catheterized and cervical / cerebral angiograms taken. After review of images, the catheter was removed without incident. The 5-Fr sheath was then exchanged over the glidewire for an 8-Fr sheath. Under real-time fluoroscopy, the guide sheath was advanced over the select catheter and glidewire into the descending aorta. The select catheter was then advanced into the right internal carotid artery. The guide sheath was then advanced into the proximal segment of the cervical  right internal carotid artery. The Select catheter was removed and the 058 guide catheter was coaxially introduced over the 27 microcatheter and microwire. The microcatheter was then advanced under roadmap guidance into the distal right anterior cerebral artery. The guide catheter was then tracked over the microcatheter to its final position in the proximal right A1. The microcatheter was then removed and the coiling catheter introduced over the microwire. The catheter was then navigated into the aneurysm lumen over the microwire. The wire was then removed. The above coils were then sequentially deployed with progressive exclusion of the aneurysm. Angiogram was taken immediately prior, after placement of the first coil, and immediately after coiling. The microcatheter was then removed and the guide catheter withdrawn down into the distal cervical internal carotid artery. Final control angiogram was taken. The guide catheter was then removed and the guide sheath was withdrawn down into the descending aorta. The Berenstein catheter was then introduced over the Glidewire and the left internal carotid artery was selected. The guide sheath was then advanced into the proximal cervical left internal carotid artery. Berenstein catheter was removed and again, the guide catheter was introduced over the 27 microcatheter and microwire. Under roadmap guidance, the microcatheter was advanced ultimately into the M2 segment. This allowed advancement of the guide catheter into the proximal supraclinoid internal carotid artery, just proximal to the origin of the posterior communicating artery. The 27 microcatheter was then removed and the coiling catheter was again introduced over the microwire. The aneurysm lumen was then carefully catheterized. The above coils were then sequentially deployed with angiograms taken during coiling. After placement of the final coil, the microcatheter was removed without incident. The guide catheter  was again withdrawn down into the distal cervical internal carotid artery and final control angiogram was taken. The guide catheter and guide sheath were then synchronously removed without incident. FINDINGS: Right internal carotid, head: Injection reveals the presence of a widely patent ICA, M1, and A1 segments and their branches. Note is made of a fetal type right posterior cerebral artery. There is a leftward projecting aneurysm arising from the anterior communicating artery proper. This aneurysm measures 4.5 x 3.0 mm. Note is made of triplicate A2 segments. No vasospasm is seen. The parenchymal and venous phases are normal. The venous sinuses are widely patent. Left internal carotid, head: Injection reveals the presence of a widely patent ICA, A1, and M1 segments and their branches. There is a bilobed aneurysm arising at the origin of the left posterior communicating artery. Aneurysm measures approximately 5.6 x 3.4 mm. The aneurysm neck appears to be somewhat distinct from the origin of the left posterior communicating artery. No other aneurysms of the left carotid circulation are noted. Also of note, the above described anterior communicating artery aneurysm is not well visualized. The parenchymal and venous phases are normal. The venous sinuses are widely patent. Right vertebral: Injection reveals the presence of a widely patent vertebral artery. There is flash filling of the contralateral left vertebral artery which appears to be non  dominant. The origin of the left PICA is seen without any aneurysms noted. This leads to a widely patent basilar artery that terminates in left P1. The right P1 is hypoplastic consistent with the fetal type posterior cerebral artery seen on right carotid injection. The basilar apex is normal. No aneurysms, AVMs, or high-flow fistulas are seen. There is no vasospasm of the posterior circulation. The parenchymal and venous phases are normal. The venous sinuses are widely patent.  Right anterior cerebral artery, head (pre embolization) Again seen is the leftward projecting aneurysm arising from the anterior communicating artery in the setting of triple A2 segments. Right anterior cerebral artery, head (during embolization): Injection reveals the presence of a widely patent A1 and all 3 A2 segments. Coil mass within the aneurysm is stable, without coil prolapse or filling defect to suggest thrombus. Right internal carotid artery, head (immediate post embolization): Injection reveals the presence of a widely patent distal internal carotid artery. The ICA bifurcation is normal. Coil mass appears to be stable within the aneurysm. There is good filling of the distal 3 A2 segments. No filling defects are noted. Right internal carotid artery, head (final control): Injection reveals the presence of a widely patent ICA that leads to a patent ACA and MCA. No thrombus is visualized. Coil mass is seen within the aneurysm and is in stable position. Capillary phase does not demonstrate any perfusion deficits. Venous sinuses remain widely patent. 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 The origin of the left posterior communicating artery appears distinct from the aneurysm neck. The anterior choroidal artery is also visualized. Left internal carotid artery, head (during embolization) Angiograms taken during coil embolization of the posterior communicating artery aneurysm confirm coil mass to remain within the aneurysm. No coil prolapse is noted. The posterior communicating artery remains patent, as does the anterior choroidal artery. No filling defects are noted to suggest thrombus formation. Left internal carotid artery, head (immediate post-embolization): Injection reveals the presence of a widely patent ICA that leads to a patent  ACA and MCA. Coil mass within the aneurysm is stable, without coil prolapse or filling defect to suggest thrombus. There is minimal aneurysm residual filling, with significant contrast stasis extending well past the venous phase. Left internal carotid artery, head (final control): Injection reveals the presence of a widely patent ICA that leads to a patent ACA and MCA. No thrombus is visualized. Coil mass is seen within the aneurysm and is in stable position. The parenchymal and venous phases are unremarkable. Right femoral: Normal vessel. No significant atherosclerotic disease. Arterial sheath in adequate position. DISPOSITION: Upon completion of the study, the femoral sheath was removed and hemostasis obtained using manual compression. Good proximal and distal lower extremity pulses were documented upon achievement of hemostasis. The procedure was well tolerated and no early complications were observed. The patient was transferred to the neuro intensive care unit for further care. IMPRESSION: 1. 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 Minimal aneurysm residual is noted at both locations, with significant contrast stasis in the left posterior communicating artery aneurysm. 2. No other intracranial aneurysms, arteriovenous malformations, or fistulas are noted. There is no vasospasm noted. The preliminary results of this procedure were shared with the patient's family. Electronically Signed   By: Lisbeth Renshaw   On: 01/07/2022 15:41   IR Angiogram Follow Up Study  Result Date: 01/07/2022 PROCEDURE: DIAGNOSTIC CEREBRAL ANGIOGRAM COIL EMBOLIZATION OF ANTERIOR COMMUNICATING ARTERY ANEURYSM COIL EMBOLIZATION OF LEFT POSTERIOR COMMUNICATING ARTERY ANEURYSM HISTORY: The patient is a 46 year old woman initially presenting to the hospital with sudden onset of severe headache. She appeared to have relatively rapid decline while in the  emergency department with repeat CT scan confirming progressive hydrocephalus. After her transfer to [facility] external ventricular drain was placed. She appeared to improve neurologically. 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 She therefore presents for diagnostic cerebral angiogram and possible coil embolization of 1 or both aneurysms. ACCESS: The technical aspects of the procedure as well as its potential risks and benefits were reviewed with the patient's family including her son and daughter. These risks included but were not limited to stroke, intracranial hemorrhage, bleeding, infection, allergic reaction, damage to organs or vital structures, stroke, non-diagnostic procedure, and the catastrophic outcomes of heart attack, coma, and death. With an understanding of these risks, informed consent was obtained and witnessed. The patient was placed in the supine position on the angiography table and the skin of right groin prepped in the usual sterile fashion. The procedure was performed under general anesthesia. A 5-French sheath was introduced in the right common femoral artery using Seldinger technique. MEDICATIONS: HEPARIN: 0 Units total (exclusive of heparinized saline flush) CONTRAST:  75mL OMNIPAQUE IOHEXOL 300 MG/ML SOLN, 25mL OMNIPAQUE IOHEXOL 300 MG/ML SOLNcc, Omnipaque 300 FLUOROSCOPY TIME:  FLUOROSCOPY TIME: See IR records TECHNIQUE: CATHETERS AND WIRES 5-French JB-1 catheter 180 cm 0.035" glidewire 6-French 90cm NeuronMax guide sheath 6-French Berenstein Select JB-1 catheter 0.058" CatV guidecatheter 150 cm Marksman microcatheter Synchro 2 select standard microwire Excelsior XT-17 microcatheter COILS USED Anterior communicating aneurysm: Target XL 360 soft 3 mm x 9 cm Posterior communicating artery aneurysm: Target XL 360 soft 4 mm x 8 cm Target Tetra 2.0 mm x 6 cm Target Tetra  2.0 mm x 4.5 cm Target Tetra 1.5 mm x 3 cm VESSELS CATHETERIZED Right internal carotid Left internal carotid Right vertebral Right common femoral Right anterior cerebral artery Left internal carotid artery VESSELS STUDIED Right internal carotid, head Left internal carotid, head Right vertebral Right anterior cerebral artery, head (pre-embolization) Right anterior cerebral artery, head (during embolization) Right internal carotid artery, head (immediate post-embolization) Right internal carotid artery, head (final control) Left internal carotid artery, head (pre embolization) Left internal carotid artery, head (during embolization) Left internal carotid artery, head (immediate post-embolization) Left internal carotid artery, head (final control) Right common femoral PROCEDURAL NARRATIVE A 5-Fr JB-1 glide catheter was advanced over a 0.035 glidewire into the aortic arch. The above vessels were then sequentially catheterized and cervical / cerebral angiograms taken. After review of images, the catheter was removed without incident. The 5-Fr sheath was then exchanged over the glidewire for an 8-Fr sheath. Under real-time fluoroscopy, the guide sheath was advanced over the select catheter and glidewire into the descending aorta. The select catheter was then advanced into the right internal carotid artery. The guide sheath was then advanced into the proximal segment of the cervical right internal carotid artery. The Select catheter was removed and the 058 guide catheter was coaxially introduced over the 27 microcatheter and microwire. The microcatheter was then advanced under roadmap guidance into the distal right anterior cerebral artery. The guide catheter was then tracked over the microcatheter to its final position in the proximal right A1. The microcatheter was then removed and the coiling catheter introduced  over the microwire. The catheter was then navigated into the aneurysm lumen over the microwire. The wire was  then removed. The above coils were then sequentially deployed with progressive exclusion of the aneurysm. Angiogram was taken immediately prior, after placement of the first coil, and immediately after coiling. The microcatheter was then removed and the guide catheter withdrawn down into the distal cervical internal carotid artery. Final control angiogram was taken. The guide catheter was then removed and the guide sheath was withdrawn down into the descending aorta. The Berenstein catheter was then introduced over the Glidewire and the left internal carotid artery was selected. The guide sheath was then advanced into the proximal cervical left internal carotid artery. Berenstein catheter was removed and again, the guide catheter was introduced over the 27 microcatheter and microwire. Under roadmap guidance, the microcatheter was advanced ultimately into the M2 segment. This allowed advancement of the guide catheter into the proximal supraclinoid internal carotid artery, just proximal to the origin of the posterior communicating artery. The 27 microcatheter was then removed and the coiling catheter was again introduced over the microwire. The aneurysm lumen was then carefully catheterized. The above coils were then sequentially deployed with angiograms taken during coiling. After placement of the final coil, the microcatheter was removed without incident. The guide catheter was again withdrawn down into the distal cervical internal carotid artery and final control angiogram was taken. The guide catheter and guide sheath were then synchronously removed without incident. FINDINGS: Right internal carotid, head: Injection reveals the presence of a widely patent ICA, M1, and A1 segments and their branches. Note is made of a fetal type right posterior cerebral artery. There is a leftward projecting aneurysm arising from the anterior communicating artery proper. This aneurysm measures 4.5 x 3.0 mm. Note is made of  triplicate A2 segments. No vasospasm is seen. The parenchymal and venous phases are normal. The venous sinuses are widely patent. Left internal carotid, head: Injection reveals the presence of a widely patent ICA, A1, and M1 segments and their branches. There is a bilobed aneurysm arising at the origin of the left posterior communicating artery. Aneurysm measures approximately 5.6 x 3.4 mm. The aneurysm neck appears to be somewhat distinct from the origin of the left posterior communicating artery. No other aneurysms of the left carotid circulation are noted. Also of note, the above described anterior communicating artery aneurysm is not well visualized. The parenchymal and venous phases are normal. The venous sinuses are widely patent. Right vertebral: Injection reveals the presence of a widely patent vertebral artery. There is flash filling of the contralateral left vertebral artery which appears to be non dominant. The origin of the left PICA is seen without any aneurysms noted. This leads to a widely patent basilar artery that terminates in left P1. The right P1 is hypoplastic consistent with the fetal type posterior cerebral artery seen on right carotid injection. The basilar apex is normal. No aneurysms, AVMs, or high-flow fistulas are seen. There is no vasospasm of the posterior circulation. The parenchymal and venous phases are normal. The venous sinuses are widely patent. Right anterior cerebral artery, head (pre embolization) Again seen is the leftward projecting aneurysm arising from the anterior communicating artery in the setting of triple A2 segments. Right anterior cerebral artery, head (during embolization): Injection reveals the presence of a widely patent A1 and all 3 A2 segments. Coil mass within the aneurysm is stable, without coil prolapse or filling defect to suggest thrombus. Right internal carotid artery, head (immediate  post embolization): Injection reveals the presence of a widely patent  distal internal carotid artery. The ICA bifurcation is normal. Coil mass appears to be stable within the aneurysm. There is good filling of the distal 3 A2 segments. No filling defects are noted. Right internal carotid artery, head (final control): Injection reveals the presence of a widely patent ICA that leads to a patent ACA and MCA. No thrombus is visualized. Coil mass is seen within the aneurysm and is in stable position. Capillary phase does not demonstrate any perfusion deficits. Venous sinuses remain widely patent. 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 The origin of the left posterior communicating artery appears distinct from the aneurysm neck. The anterior choroidal artery is also visualized. Left internal carotid artery, head (during embolization) Angiograms taken during coil embolization of the posterior communicating artery aneurysm confirm coil mass to remain within the aneurysm. No coil prolapse is noted. The posterior communicating artery remains patent, as does the anterior choroidal artery. No filling defects are noted to suggest thrombus formation. Left internal carotid artery, head (immediate post-embolization): Injection reveals the presence of a widely patent ICA that leads to a patent ACA and MCA. Coil mass within the aneurysm is stable, without coil prolapse or filling defect to suggest thrombus. There is minimal aneurysm residual filling, with significant contrast stasis extending well past the venous phase. Left internal carotid artery, head (final control): Injection reveals the presence of a widely patent ICA that leads to a patent ACA and MCA. No thrombus is visualized. Coil mass is seen within the aneurysm and is in stable position. The parenchymal and venous phases are unremarkable. Right femoral: Normal  vessel. No significant atherosclerotic disease. Arterial sheath in adequate position. DISPOSITION: Upon completion of the study, the femoral sheath was removed and hemostasis obtained using manual compression. Good proximal and distal lower extremity pulses were documented upon achievement of hemostasis. The procedure was well tolerated and no early complications were observed. The patient was transferred to the neuro intensive care unit for further care. IMPRESSION: 1. 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 Minimal aneurysm residual is noted at both locations, with significant contrast stasis in the left posterior communicating artery aneurysm. 2. No other intracranial aneurysms, arteriovenous malformations, or fistulas are noted. There is no vasospasm noted. The preliminary results of this procedure were shared with the patient's family. Electronically Signed   By: Lisbeth Renshaw   On: 01/07/2022 15:41   CT HEAD WO CONTRAST  Result Date: 01/07/2022 CLINICAL DATA:  Follow-up examination for subarachnoid hemorrhage. EXAM: CT HEAD WITHOUT CONTRAST TECHNIQUE: Contiguous axial images were obtained from the base of the skull through the vertex without intravenous contrast. RADIATION DOSE REDUCTION: This exam was performed according to the departmental dose-optimization program which includes automated exposure control, adjustment of the mA and/or kV according to patient size and/or use of iterative reconstruction technique. COMPARISON:  Prior CT from 01/06/2022. FINDINGS: Brain: Scattered moderate volume subarachnoid hemorrhage again seen, similar to perhaps minimally decreased as compared to previous exam. There has been interval placement of a right frontal approach ventriculostomy with tip at the margin of the right lateral ventricle. Persistent hydrocephalus appears slightly decreased from prior. Small volume  intraventricular hemorrhage seen, likely related to redistribution. No new intracranial hemorrhage. No acute large vessel territory infarct. No mass lesion or midline shift. No  significant extra-axial collection. Vascular: No appreciable hyperdense vessel, although intracranial vasculature is obscured by adjacent subarachnoid hemorrhage. Skull: Interval right frontal ventriculostomy. No other new finding. Sinuses/Orbits: Globes and orbital soft tissues within normal limits. Paranasal sinuses and mastoid air cells remain clear. Other: None. IMPRESSION: 1. Interval placement of a right frontal approach ventriculostomy with tip at the margin of the right lateral ventricle. Persistent but slightly improved hydrocephalus from previous. 2. Scattered moderate volume subarachnoid hemorrhage, similar to perhaps minimally decreased from prior. Small volume intraventricular hemorrhage consistent with redistribution. 3. No other new acute intracranial abnormality. Electronically Signed   By: Rise Mu M.D.   On: 01/07/2022 02:49   DG CHEST PORT 1 VIEW  Result Date: 01/06/2022 CLINICAL DATA:  OG tube placement EXAM: PORTABLE CHEST 1 VIEW COMPARISON:  01/06/2022 FINDINGS: Endotracheal tube is 4 cm above the carina. OG tube is in the stomach. No confluent airspace opacities. Heart is normal size. No effusions or acute bony abnormality. IMPRESSION: OG tube in the stomach. No acute cardiopulmonary disease. Electronically Signed   By: Charlett Nose M.D.   On: 01/06/2022 23:29   DG Chest Portable 1 View  Result Date: 01/06/2022 CLINICAL DATA:  Intubation. EXAM: PORTABLE CHEST 1 VIEW COMPARISON:  Chest radiograph dated 07/26/2021. FINDINGS: Endotracheal tube with tip approximately 5 cm above the carina. The lungs are clear. There is no pleural effusion or pneumothorax. The cardiac silhouette is within normal limits. No acute osseous pathology. IMPRESSION: No acute cardiopulmonary process. Endotracheal tube with tip  above the carina. Electronically Signed   By: Elgie Collard M.D.   On: 01/06/2022 21:55   CT HEAD WO CONTRAST ( )  Addendum Date: 01/06/2022   ADDENDUM REPORT: 01/06/2022 21:42 ADDENDUM: Case discussed over the phone with Dr. Rubin Payor at 9:43 p.m., 01/06/2022, with verbal acknowledgement of findings. Electronically Signed   By: Almira Bar M.D.   On: 01/06/2022 21:42   Result Date: 01/06/2022 CLINICAL DATA:  Subarachnoid hemorrhage follow-up. Mental status changes. EXAM: CT HEAD WITHOUT CONTRAST TECHNIQUE: Contiguous axial images were obtained from the base of the skull through the vertex without intravenous contrast. RADIATION DOSE REDUCTION: This exam was performed according to the departmental dose-optimization program which includes automated exposure control, adjustment of the mA and/or kV according to patient size and/or use of iterative reconstruction technique. COMPARISON:  Head CT earlier today at 5:32 p.m., CTA head and neck today 5:38 p.m. FINDINGS: Brain: There is interval increased moderate subarachnoid hemorrhage in the interhemispheric fissures, suprasellar, midbrain and posterior fossa cisterns, and in the right-greater-than-left sylvian fissures. Small amount of layering blood products have also increased in the posterior horns of both ventricles and in the posterior aspect of the third ventricle extending into the sylvian aqueduct. In addition there is increased generalized cerebral edema and sulcal crowding and increased moderate ventriculomegaly involving the third and lateral ventricles. No large territorial infarct or midline shift is seen. No parenchymal bleed is evident. There does not appear to be increased cerebellar or brainstem edema at least by CT appearance. No cerebellar tonsillar herniation is seen with the left tonsil lower in position than the right but unchanged. There is increased basal cisternal crowding along with the increased cerebral edema. The fourth ventricle  appears no narrower than previously Vascular: Difficult to evaluate given the presence of contrast and subarachnoid hemorrhage. CTA head demonstrated 4-5 mm aneurysms suspected of the supraclinoid left ICA and ACOM but these are not visible due to subarachnoid hemorrhaging. Skull: No focal abnormality. Sinuses/Orbits: Clear paranasal  sinuses. Unremarkable orbital contents. Other: No mastoid effusion. IMPRESSION: 1. Worsening moderate-volume subarachnoid hemorrhage as detailed above. 2. Increased cerebral edema and sulcal crowding. Increased crowding in the basal cisterns as well but no tonsillar herniation is seen. 3. Increased moderate hydrocephalus. 4. We are attempting to reach the ordering physician to relay these results. The report will be addended when I have spoken to the ordering physician. Electronically Signed: By: Almira Bar M.D. On: 01/06/2022 21:39   CT ANGIO HEAD W OR WO CONTRAST  Result Date: 01/06/2022 CLINICAL DATA:  Subarachnoid hemorrhage on same day CT head. Evaluate for aneurysm. EXAM: CT ANGIOGRAPHY HEAD TECHNIQUE: Multidetector CT imaging of the head was performed using the standard protocol during bolus administration of intravenous contrast. Multiplanar CT image reconstructions and MIPs were obtained to evaluate the vascular anatomy. RADIATION DOSE REDUCTION: This exam was performed according to the departmental dose-optimization program which includes automated exposure control, adjustment of the mA and/or kV according to patient size and/or use of iterative reconstruction technique. CONTRAST:  59mL OMNIPAQUE IOHEXOL 350 MG/ML SOLN COMPARISON:  Same day CT head. FINDINGS: CTA HEAD Anterior circulation: Bilateral intracranial ICAs, MCAs, and ACAs are patent without proximal hemodynamically significant stenosis. Irregular, lobulated 5 x 4 mm outpouching rising from the left supraclinoid ICA (see series 5, image 38). Additional 4 x 3 mm anterior communicating artery aneurysm (series  5, image 44; series 8, image 94). Posterior circulation: Bilateral intradural vertebral arteries, basilar artery, and bilateral posterior cerebral arteries are patent without proximal hemodynamically significant stenosis. Right fetal type PCA, anatomic variant. Venous sinuses: As permitted by contrast timing, patent. Review of the MIP images confirms the above findings. IMPRESSION: 1. Irregular, lobulated 5 x 4 mm outpouching rising from the left supraclinoid ICA, concerning for aneurysm that probably is ruptured given irregular morphology and subarachnoid hemorrhage seen on same day CT head. 2. 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 3. No emergent large vessel occlusion or proximal hemodynamically significant stenosis. Findings discussed with Dr. Rubin Payor via telephone at 5:55 p.m. Electronically Signed   By: Feliberto Harts M.D.   On: 01/06/2022 18:06   CT HEAD WO CONTRAST ( )  Result Date: 01/06/2022 CLINICAL DATA:  Headache, sudden, severe EXAM: CT HEAD WITHOUT CONTRAST TECHNIQUE: Contiguous axial images were obtained from the base of the skull through the vertex without intravenous contrast. RADIATION DOSE REDUCTION: This exam was performed according to the departmental dose-optimization program which includes automated exposure control, adjustment of the mA and/or kV according to patient size and/or use of iterative reconstruction technique. COMPARISON:  None Available. FINDINGS: Brain: Moderate volume of subarachnoid hemorrhage with hemorrhage along the interhemispheric falx, suprasellar cistern, sylvian fissures and basal cisterns. No evidence of acute large vascular territory infarct, mass lesion, or midline shift. Borderline ventriculomegaly. Vascular: Limited assessment for hyperdense vessel due to the subarachnoid hemorrhage. Skull: No acute fracture. Sinuses/Orbits: Clear sinuses.  No acute orbital findings. Other: No mastoid effusions. IMPRESSION: 1. Moderate volume of  acute subarachnoid hemorrhage, described above. Recommend CTA to evaluate for aneurysm. 2. Borderline ventriculomegaly. Recommend continued attention on follow-up to exclude developing hydrocephalus. Critical findings discussed with Dr. Rubin Payor via telephone at 5:40 p.m. Electronically Signed   By: Feliberto Harts M.D.   On: 01/06/2022 17:43    Labs:  Basic Metabolic Panel: Recent Labs  Lab 01/22/22 0527  NA 136  K 4.1  CL 100  CO2 21*  GLUCOSE 111*  BUN 11  CREATININE 0.67  CALCIUM 9.7    CBC:  Recent Labs  Lab 01/22/22 0527  WBC 5.0  NEUTROABS 3.4  HGB 14.4  HCT 41.6  MCV 86.8  PLT 373    CBG: No results for input(s): "GLUCAP" in the last 168 hours.  Family history.  Father with CVA.  Denies any colon cancer esophageal cancer or rectal cancer  Brief HPI:   BURNELL MATLIN is a 46 y.o. right-handed female with history of myopericarditis with admission 11/22 and ejection fraction on echocardiogram of 55 to 60% no wall motion abnormalities initially placed on colchicine and followed by Dr. Zoila Shutter.  Per chart review lives with her 64 year old daughter.  Independent prior to admission working full-time.  Presented 01/06/2022 with severe headache as well as nausea and vomiting.  Cranial CT scan showed moderate volume of acute subarachnoid hemorrhage along the interhemispheric falx, suprasellar cistern, sylvian fissures and basal cisterns.  No evidence of midline shift.  Borderline ventriculomegaly.  CT angiogram head and neck showed irregular, lobulated 5 x 4 mm outpouching arising from the left supraclinoid ICA concerning for aneurysm as well as additional 4 x 3 mm anterior communicating artery aneurysm.  No emergent large vessel occlusion or proximal hemodynamically significant stenosis.  Admission chemistries unremarkable except potassium 2.7 alcohol urine drug screen negative.  Patient underwent diagnostic cerebral angiogram 01/07/2022 by Dr. 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  Maintained on Nimotop per protocol.  Keppra added for seizure prophylaxis.  Patient was cleared to begin Lovenox for DVT prophylaxis 01/14/2022.  Therapy evaluations completed due to patient's decreased functional mobility was admitted for a comprehensive rehab program.   Hospital Course: TIRZA SENTENO was admitted to rehab 01/21/2022 for inpatient therapies to consist of PT, ST and OT at least three hours five days a week. Past admission physiatrist, therapy team and rehab RN have worked together to provide customized collaborative inpatient rehab.  Pertaining to patient's SAH/ruptured anterior communicating/left posterior communicating artery aneurysm status post coil embolization 01/07/2022 per Dr. Conchita Paris.  Patient had been cleared to begin Lovenox for DVT prophylaxis 01/14/2022.  No bleeding episodes.  Pain managed use of Neurontin titrate as needed and since discontinued.  Placed on Topamax for headaches.  Seizure prophylaxis with Keppra no seizure activity.  Blood pressure control completing protocol for Nimotop.  History of myopericarditis follow-up outpatient Dr. Zoila Shutter patient asymptomatic.   Blood pressures were monitored on TID basis and controlled     Rehab course: During patient's stay in rehab weekly team conferences were held to monitor patient's progress, set goals and discuss barriers to discharge. At admission, patient required min mod assist 260 feet rolling walker min assist sit to stand  Physical exam.  Blood pressure 121/77 pulse 85 temperature 98.8 respirations 16 oxygen saturations 100% room air Constitutional.  No acute distress HEENT.  EVD site clean and dry 2 staples were intact Eyes.  Pupils round and reactive to light no discharge without nystagmus Neck.  Supple nontender no JVD without thyromegaly Cardiac regular rate and rhythm without any extra sounds or murmur  heard Abdomen.  Soft nontender positive bowel sounds without rebound Respiratory effort normal no respiratory distress without wheeze Musculoskeletal.  Normal range of motion Neurologic.  Alert oriented x3.  No cranial nerve deficits.  No sensory deficit.  He/She  has had improvement in activity tolerance, balance, postural control as well as ability to compensate for deficits. He/She has had improvement in functional use RUE/LUE  and RLE/LLE as well as  improvement in awareness.  Perform sit to stand independently without assistive device.  Ambulates supervision without assistive device.  Patient continent of bowel and bladder perform [employer] lower body clothing management and hand hygiene independently.  Completes sit to stand transfers independently ambulates within her room with supervision to retrieve and sort clean close from dirty close.  Ambulates to the Ortho gym with rolling walker modified independent.  Completes stand to sit transfers modified independent.  Recalls 2 out of 4 task completing during speech therapy sessions completed counting money subtest with 80% accuracy given extended processing time.  Patient sustained attention to complete alpha subtest for up to 20 minutes with no rest breaks needed giving supervision.  It was expressed to family need for 24-hour supervision for safety.  Full family teaching completed and discharged to home       Disposition: Discharged home    Diet: Regular  Special Instructions: No driving smoking or alcohol  Medications at discharge 1.  Folic acid 1 mg p.o. daily 2.  Keppra 500 mg p.o. twice daily 3.  Melatonin 5 mg p.o. nightly 4.  Robaxin 500 mg p.o. every 6 hours as needed muscle spasms 5.  Multivitamin daily 6.  MiraLAX daily hold for loose stools 7.  Topamax 25 mg p.o. twice daily 8.  Estradiol 0.25-35 mcg daily 9.Tramadol 50 mg every 6 hour as needed   30-35 minutes were spent completing discharge summary and discharge  planning  Discharge Instructions     Ambulatory referral to Physical Medicine Rehab   Complete by: As directed    Moderate complexity follow-up 1 to 2 weeks ICH        Follow-up Information     Ranelle Oyster, MD Follow up.   Specialty: Physical Medicine and Rehabilitation Why: Office to call for appointment Contact information: [address] [place] [address] [phone]         Lisbeth Renshaw, MD Follow up.   Specialty: Neurosurgery Why: Call for appointment Contact information: [address] [place] [address] [phone]                 Signed: Charlton Amor 01/27/2022, 5:27 AM

## 2022-01-24 NOTE — Progress Notes (Signed)
Occupational Therapy Discharge Summary  Patient Details  Name: Judy Walsh MRN: [id number] Date of Birth: May 07, 1976  {CHL IP REHAB OT TIME CALCULATIONS:[id number]}   Patient has met 6 of 6 long term goals due to improved activity tolerance, improved balance, postural control, ability to compensate for deficits, improved attention, improved awareness, and improved coordination.  Patient to discharge at overall Modified Independent level.  Patient's care partner is independent to provide the necessary cognitive assistance at discharge.  Family education has been completed with her parents. [name] will benefit from further SLP services to advance higher level cognition and return to work.   Reasons goals not met: All goals met.   Recommendation:  No further OT services needed.   Equipment: No equipment provided  Reasons for discharge: treatment goals met and discharge from hospital  Patient/family agrees with progress made and goals achieved: Yes  OT Discharge Precautions/Restrictions  Precautions Precautions: Fall Precaution Comments: SBP goal 130-150 Restrictions Weight Bearing Restrictions: No  ADL ADL Eating: Independent Grooming: Modified independent Where Assessed-Grooming: Standing at sink Upper Body Bathing: Modified independent Where Assessed-Upper Body Bathing: Shower Lower Body Bathing: Modified independent Where Assessed-Lower Body Bathing: Shower Upper Body Dressing: Modified independent (Device) Where Assessed-Upper Body Dressing: Edge of bed Lower Body Dressing: Modified independent Where Assessed-Lower Body Dressing: Edge of bed Toileting: Modified independent Where Assessed-Toileting: Glass blower/designer: Diplomatic Services operational officer Method: Human resources officer: Modified independent Clinical cytogeneticist Method: Optometrist: Civil engineer, contracting with back, Energy manager: Modified independent Press photographer Method: Heritage manager: Civil engineer, contracting with back Vision Baseline Vision/History: 0 No visual deficits Patient Visual Report: Eye fatigue/eye pain/headache Vision Assessment?: Yes Eye Alignment: Within Functional Limits (dilated pupils) Ocular Range of Motion: Within Functional Limits Alignment/Gaze Preference: Within Defined Limits Tracking/Visual Pursuits: Able to track stimulus in all quads without difficulty Convergence: Within functional limits Perception  Perception: Within Functional Limits Praxis Praxis: Intact Cognition Cognition Overall Cognitive Status: Impaired/Different from baseline Arousal/Alertness: Awake/alert Orientation Level: Person;Place;Situation Person: Oriented Place: Oriented Situation: Oriented Memory: Impaired Memory Impairment: Retrieval deficit;Decreased recall of new information;Decreased short term memory Decreased Short Term Memory: Verbal basic;Functional basic Attention: Selective Selective Attention: Impaired Selective Attention Impairment: Verbal basic;Functional basic Awareness: Impaired Awareness Impairment: Anticipatory impairment Problem Solving: Impaired Problem Solving Impairment: Verbal complex;Functional complex Executive Function: Self Monitoring Self Monitoring: Impaired Self Monitoring Impairment: Verbal complex;Functional complex Safety/Judgment: Impaired Comments: Slight overestimation of abilities Sensation Sensation Light Touch: Appears Intact Hot/Cold: Appears Intact Proprioception: Appears Intact Coordination Gross Motor Movements are Fluid and Coordinated: Yes Fine Motor Movements are Fluid and Coordinated: Yes Motor  Motor Motor: Within Functional Limits Mobility  Bed Mobility Bed Mobility: Rolling Left;Sit to Supine;Supine to Sit Rolling Left: Independent Left Sidelying to Sit: Independent Supine to Sit: Independent Sitting - Scoot to Edge of Bed: Independent Sit to Supine:  Independent Sit to Sidelying Left: Independent Transfers Sit to Stand: Independent Stand to Sit: Independent  Trunk/Postural Assessment  Cervical Assessment Cervical Assessment: Within Functional Limits Thoracic Assessment Thoracic Assessment: Within Functional Limits Lumbar Assessment Lumbar Assessment: Within Functional Limits Postural Control Postural Control: Within Functional Limits  Balance Balance Balance Assessed: Yes Static Sitting Balance Static Sitting - Balance Support: Feet supported;No upper extremity supported Static Sitting - Level of Assistance: 7: Independent Dynamic Sitting Balance Dynamic Sitting - Balance Support: No upper extremity supported;Feet supported Dynamic Sitting - Level of Assistance: 7: Independent Static Standing Balance Static Standing - Balance Support: No upper extremity supported;During functional activity Static  Standing - Level of Assistance: 7: Independent Dynamic Standing Balance Dynamic Standing - Balance Support: During functional activity;No upper extremity supported Dynamic Standing - Level of Assistance: 7: Independent Extremity/Trunk Assessment RUE Assessment RUE Assessment: Within Functional Limits LUE Assessment LUE Assessment: Within Functional Limits   Curtis Sites 01/24/2022, 2:54 PM

## 2022-01-24 NOTE — Progress Notes (Signed)
Occupational Therapy Session Note  Patient Details  Name: Judy Walsh MRN: [id number] Date of Birth: Apr 27, 1976  Today's Date: 01/24/2022 OT Individual Time: 8721-5872 OT Individual Time Calculation (min): 60 min    Short Term Goals: Week 1:  OT Short Term Goal 1 (Week 1): Pt will complete LB dressing with Mod I. OT Short Term Goal 2 (Week 1): Pt will maintain dynamic standing balance during funcitonal ADL with Mod I. OT Short Term Goal 3 (Week 1): Pt will increase memory recall with familiar tasks with min verbal cues. OT Short Term Goal 4 (Week 1): Pt will demonstrated selective attention to tasks with min verbal cueing.  Skilled Therapeutic Interventions/Progress Updates:    Upon OT arrival, pt seated in chair talking with nurse and requesting to go home ASAP. Pt reports minimal pain in head from headache 2/10. Agreeable to OT treatment session. Treatment intervention with a focus on IADLs, functional mobility, education, and higher level cognition. Pt completes sit to stand transfer Independently and ambulates within room with Supervision to retrieve and sort clean clothes from dirty clothes and fold to put into suitcase. Pt demonstrates no difficulty to complete but does require increased time. Pt then ambulates to ortho gym with RW and Mod I. Pt completes stand to sit transfer with Mod I to EOM. Pt sits EOM to shuffle and sort cards based on suit then in numerical order using B UE. Pt able to sort all cards without errors. Pt able to engage in conversation while sorting. Pt and therapist discussed attempt to engage in reading with more manageable goal to accomplish. For example, reading 5 pages versus a chapter to reduce feeling overwhelming. Pt verbalizes good understanding. Pt completes sit to stand transfer with Mod I with RW and ambulates back to her room with Mod I. Pt completes stand to sit transfer with Mod I and sit to supine transfer with Mod I. Pt left in bed with all needs met.    Therapy Documentation Precautions:  Precautions Precautions: Fall Precaution Comments: SBP goal 130-150 Restrictions Weight Bearing Restrictions: No    Therapy/Group: Individual Therapy  Maha Fischel 01/24/2022, 10:45 AM

## 2022-01-24 NOTE — Progress Notes (Signed)
PROGRESS NOTE   Subjective/Complaints: Slept much better last night. Just finished getting washed up with OT. Headaches present but dull, improved. Anxious to get home  ROS: Patient denies fever, rash, sore throat, blurred vision, dizziness, nausea, vomiting, diarrhea, cough, shortness of breath or chest pain, joint or back/neck pain, or mood change.    Objective:   No results found.  Recent Labs    01/22/22 0527  WBC 5.0  HGB 14.4  HCT 41.6  PLT 373   Recent Labs    01/22/22 0527  NA 136  K 4.1  CL 100  CO2 21*  GLUCOSE 111*  BUN 11  CREATININE 0.67  CALCIUM 9.7    Intake/Output Summary (Last 24 hours) at 01/24/2022 1014 Last data filed at 01/23/2022 1847 Gross per 24 hour  Intake 480 ml  Output --  Net 480 ml        Physical Exam: Vital Signs Blood pressure (!) 111/96, pulse 91, temperature 97.7 F (36.5 C), temperature source Oral, resp. rate 18, height 5\' 8"  (1.727 m), weight 74.2 kg, SpO2 100 %.  Constitutional: No distress . Vital signs reviewed. HEENT: NCAT, EOMI, oral membranes moist Neck: supple Cardiovascular: RRR without murmur. No JVD    Respiratory/Chest: CTA Bilaterally without wheezes or rales. Normal effort    GI/Abdomen: BS +, non-tender, non-distended Ext: no clubbing, cyanosis, or edema Psych: pleasant and cooperative, a little impulsive  Skin: Clean and intact without signs of breakdown, staples intact on scalp Neuro:  Pt is alert. Oriented. Still somewhat distracted. Fair awareness and memory. Motor 4-5/5. No focal sensory deficits Musculoskeletal: low back tender   Assessment/Plan: 1. Functional deficits which require 3+ hours per day of interdisciplinary therapy in a comprehensive inpatient rehab setting. Physiatrist is providing close team supervision and 24 hour management of active medical problems listed below. Physiatrist and rehab team continue to assess barriers to  discharge/monitor patient progress toward functional and medical goals  Care Tool:  Bathing    Body parts bathed by patient: Right arm, Left upper leg, Right lower leg, Left arm, Chest, Left lower leg, Abdomen, Face, Front perineal area, Buttocks, Right upper leg         Bathing assist Assist Level: Supervision/Verbal cueing     Upper Body Dressing/Undressing Upper body dressing   What is the patient wearing?: Pull over shirt    Upper body assist Assist Level: Supervision/Verbal cueing    Lower Body Dressing/Undressing Lower body dressing      What is the patient wearing?: Underwear/pull up, Pants     Lower body assist Assist for lower body dressing: Contact Guard/Touching assist     Toileting Toileting Toileting Activity did not occur (Clothing management and hygiene only):  (pt used bathroom)  Toileting assist Assist for toileting: Supervision/Verbal cueing     Transfers Chair/bed transfer  Transfers assist     Chair/bed transfer assist level: Independent     Locomotion Ambulation   Ambulation assist      Assist level: Supervision/Verbal cueing Assistive device: No Device Max distance: 1134 ft   Walk 10 feet activity   Assist     Assist level: Supervision/Verbal cueing Assistive device: No Device  Walk 50 feet activity   Assist    Assist level: Supervision/Verbal cueing Assistive device: No Device    Walk 150 feet activity   Assist    Assist level: Supervision/Verbal cueing Assistive device: No Device    Walk 10 feet on uneven surface  activity   Assist     Assist level: Supervision/Verbal cueing     Wheelchair     Assist Is the patient using a wheelchair?: No             Wheelchair 50 feet with 2 turns activity    Assist            Wheelchair 150 feet activity     Assist          Blood pressure (!) 111/96, pulse 91, temperature 97.7 F (36.5 C), temperature source Oral, resp. rate 18,  height 5\' 8"  (1.727 m), weight 74.2 kg, SpO2 100 %.  Medical Problem List and Plan: 1. 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  Status post coil embolization 01/07/2022 per Dr. 01/09/2022.             -patient may shower             -ELOS/Goals: 5-7 days, mod I to supervision goals with PT, OT, SLP            -Continue CIR therapies including PT, OT, and SLP  2.  Antithrombotics: -DVT/anticoagulation:  Pharmaceutical: Lovenox initiated 01/14/2022             -antiplatelet therapy: N/A 3. Pain Management: Neurontin 200 mg every 8 hours, Robaxin as needed, Advil 400 mg every 6 hours as needed headache             -continue topamax 25mg  bid  --it has helped headaches  -dc gabapentin after next 2 doses  - kpad for low back 4. Mood/Sleep: Melatonin 5 mg nightly.  Provide emotional support             -antipsychotic agents: N/A  -scheduled trazodone has helped sleep 5. Neuropsych/cognition: This patient is capable of making decisions on her own behalf. 6. Skin/Wound Care: remove scalp staples today 7. Fluids/Electrolytes/Nutrition: encourage PO    lab work is within normal limits.  8.  Seizure prophylaxis.  Keppra 500 mg twice daily 9.  Hypertension.Nimotop protocol             -bp has generally been controlled. Some increase in DBP 6/30 10.  Myopericarditis.  Follow-up outpatient Dr.             -pt seems currently asymptomatic    LOS: 3 days A FACE TO FACE EVALUATION WAS PERFORMED  7/30 01/24/2022, 10:14 AM

## 2022-01-24 NOTE — Plan of Care (Signed)
  Problem: Consults Goal: RH BRAIN INJURY PATIENT EDUCATION Description: Description: See Patient Education module for eduction specifics Outcome: Progressing   Problem: RH SKIN INTEGRITY Goal: RH STG MAINTAIN SKIN INTEGRITY WITH ASSISTANCE Description: STG Maintain Skin Integrity With Mod I Assistance. Outcome: Progressing   Problem: RH SAFETY Goal: RH STG ADHERE TO SAFETY PRECAUTIONS W/ASSISTANCE/DEVICE Description: STG Adhere to Safety Precautions With Cues and reminders. Outcome: Progressing Goal: RH STG DECREASED RISK OF FALL WITH ASSISTANCE Description: STG Decreased Risk of Fall With Mod I Assistance. Outcome: Progressing   Problem: RH COGNITION-NURSING Goal: RH STG USES MEMORY AIDS/STRATEGIES W/ASSIST TO PROBLEM SOLVE Description: STG Uses Memory Aids/Strategies With Mod I Assistance to Problem Solve. Outcome: Progressing Goal: RH STG ANTICIPATES NEEDS/CALLS FOR ASSIST W/ASSIST/CUES Description: STG Anticipates Needs/Calls for Assist With cues and reminders. Outcome: Progressing   Problem: RH KNOWLEDGE DEFICIT BRAIN INJURY Goal: RH STG INCREASE KNOWLEDGE OF SELF CARE AFTER BRAIN INJURY Description: Patient will demonstrate knowledge of self-care management, medication management, safety awareness with educational materials and handouts provided by staff independently at discharge. Outcome: Progressing   Problem: Education: Goal: Understanding of CV disease, CV risk reduction, and recovery process will improve Outcome: Progressing Goal: Individualized Educational Video(s) Outcome: Progressing   Problem: Activity: Goal: Ability to return to baseline activity level will improve Outcome: Progressing   Problem: Cardiovascular: Goal: Ability to achieve and maintain adequate cardiovascular perfusion will improve Outcome: Progressing Goal: Vascular access site(s) Level 0-1 will be maintained Outcome: Progressing   Problem: Health Behavior/Discharge Planning: Goal:  Ability to safely manage health-related needs after discharge will improve Outcome: Progressing

## 2022-01-24 NOTE — Progress Notes (Signed)
Speech Language Pathology Daily Session Note  Patient Details  Name: Judy Walsh MRN: [id number] Date of Birth: [birth year]  Today's Date: 01/24/2022 SLP Individual Time: 2202-5427 SLP Individual Time Calculation (min): 39 min  Short Term Goals: Week 1: SLP Short Term Goal 1 (Week 1): Pt will complete generative and convergent naming tasks with 80% accuracy given Min A. SLP Short Term Goal 2 (Week 1): Pt will utilize compensatory memory strategies (internal and external aids) to recall information from daily events and restorative tasks x 4 with Min A. SLP Short Term Goal 3 (Week 1): Pt will participate in mildly-complex iADL tasks targeting medication and financial skills with 80% accuracy given Min A. SLP Short Term Goal 4 (Week 1): Pt will sustain attention to functional tasks for up to 20 minutes with less than 3 breaks needed given Sup A.  Skilled Therapeutic Interventions: Pt seen for skilled ST with focus on cognitive goals, pt in bed with parents present following OT session. Pt very anxious to go home, glad to not have ST on Sunday. SLP providing education to patient and parents on cognitive tasks to complete at home and recommendations for OPST which they agree to. Discussed cognitive fatigue and allowing for extra time to complete cognitive tasks. Pt reports her mood has been fluctuating, reporting mild agitation at times, SLP providing tools to utilize to reduce frustration and agitation at home. Pt completing "Solving Daily Math Problems" subtest from [employer] with 90% accuracy given extra time to complete task. Pt reports she is purposefully taking extra time and double checking answers to improve accuracy. During structured tasks, pt demonstrating occasional impulsivity however during unstructured tasks impulsivity increased and awareness decreased. Pt's mother had questions about strategies to help memory, shown memory notebook that was started and re-educated on WRAP memory strategies.  Pt left in bed with parents available as needed, Mod I in room currently. Cont ST POC.   Pain Pain Assessment Pain Scale: 0-10 Pain Score: 0-No pain  Therapy/Group: Individual Therapy  Tacey Ruiz 01/24/2022, 3:24 PM

## 2022-01-24 NOTE — IPOC Note (Signed)
Overall Plan of Care [facility]) Patient Details Name: Judy Walsh MRN: [id number] DOB: 1975/12/23  Admitting Diagnosis: Subarachnoid hemorrhage [facility])  Hospital Problems: Principal Problem:   Subarachnoid hemorrhage (HCC)     Functional Problem List: Nursing Edema, Endurance, Medication Management, Motor, Perception, Safety  PT Balance, Pain, Behavior, Edema, Safety, Endurance, Skin Integrity  OT Safety, Cognition, Endurance, Pain, Balance, Motor  SLP Cognition  TR         Basic ADL's: OT Dressing, Toileting     Advanced  ADL's: OT Full Meal Preparation, Laundry     Transfers: PT Bed Mobility, Bed to Chair, Car, [employer], Floor  OT Tub/Shower, Technical brewer: PT Ambulation, Stairs     Additional Impairments: OT None  SLP None      TR      Anticipated Outcomes Item Anticipated Outcome  Self Feeding Independent  Swallowing  N/A   Basic self-care  Independent  Toileting  Mod I   Bathroom Transfers Mod I  Bowel/Bladder  n/a  Transfers  Independent  Locomotion  Independent household, supervision community due to attention deficits  Communication  Min A for word finding  Cognition  Min A  Pain  < 3  Safety/Judgment  supervision   Therapy Plan: PT Intensity: Minimum of 1-2 x/day ,45 to 90 minutes PT Frequency: 5 out of 7 days PT Duration Estimated Length of Stay: 5-7 days OT Intensity: Minimum of 1-2 x/day, 45 to 90 minutes OT Frequency: 5 out of 7 days OT Duration/Estimated Length of Stay: 5-7 days SLP Intensity: Minumum of 1-2 x/day, 30 to 90 minutes SLP Frequency: 3 to 5 out of 7 days SLP Duration/Estimated Length of Stay: 2 weeks   Team Interventions: Nursing Interventions Patient/Family Education, Disease Management/Prevention, Medication Management, Discharge Planning  PT interventions Ambulation/gait training, Cognitive remediation/compensation, Discharge planning, DME/adaptive equipment instruction, Functional mobility training,  Pain management, Psychosocial support, Splinting/orthotics, Therapeutic Activities, UE/LE Strength taining/ROM, Visual/perceptual remediation/compensation, UE/LE Coordination activities, Therapeutic Exercise, Stair training, Skin care/wound management, Patient/family education, Neuromuscular re-education, Functional electrical stimulation, Disease management/prevention, Firefighter, Warden/ranger  OT Interventions Warden/ranger, Discharge planning, Pain management, Self Care/advanced ADL retraining, Therapeutic Activities, UE/LE Coordination activities, Visual/perceptual remediation/compensation, Therapeutic Exercise, Patient/family education, Functional mobility training, Disease mangement/prevention, Cognitive remediation/compensation, Community reintegration, Fish farm manager, Psychosocial support, UE/LE Strength taining/ROM, Skin care/wound managment  SLP Interventions Cognitive remediation/compensation, Functional tasks, Internal/external aids, Patient/family education, Cueing hierarchy  TR Interventions    SW/CM Interventions Discharge Planning, Psychosocial Support, Patient/Family Education   Barriers to Discharge MD  Medical stability  Nursing Decreased caregiver support, Home environment access/layout, Lack of/limited family support 2 level, 2 steps, left rail. Main level bed/bath. Mom and dad to provide 24/7 care at discharge. 64 yr old daughter and older married son live outside the home.  PT Home environment access/layout, Decreased caregiver support Patient reports that she would like to go home with her 76 year old daughter, patient's mother reports she would prefer the patient to d/c to her residence for 24/7 supervision from hre parents  OT Decreased caregiver support, Home environment access/layout cogniton will likely be a barrier to safety at home  SLP      SW Decreased caregiver support, Lack of/limited family support      Team Discharge Planning: Destination: PT-Home ,OT- Home , SLP-Home Projected Follow-up: PT-Outpatient PT, OT-  Other (comment) (to be determined depending on support available), SLP-Outpatient SLP, 24 hour supervision/assistance Projected Equipment Needs: PT-None recommended by PT, OT-  , SLP-None recommended  by SLP Equipment Details: PT- , OT-to be determined Patient/family involved in discharge planning: PT- Patient,  OT-Patient, Family member/caregiver, SLP-Patient, Family member/caregiver  MD ELOS: 5-7 days Medical Rehab Prognosis:  Excellent Assessment: The patient has been admitted for CIR therapies with the diagnosis of SAH d/t ruptured A-com aneurysm. The team will be addressing functional mobility, strength, stamina, balance, safety, adaptive techniques and equipment, self-care, bowel and bladder mgt, patient and caregiver education, NMR, cognition, communication, behavior, community reentry. Goals have been set at mod I for mobility and self-care and supervision to min assist for cognition. Anticipated discharge destination is home with family.        See Team Conference Notes for weekly updates to the plan of care

## 2022-01-24 NOTE — Progress Notes (Signed)
Occupational Therapy Session Note  Patient Details  Name: Judy Walsh MRN: [id number] Date of Birth: 1975/11/28  Today's Date: 01/24/2022 OT Individual Time: 0623-7628 OT Individual Time Calculation (min): 29 min    Short Term Goals: Week 1:  OT Short Term Goal 1 (Week 1): Pt will complete LB dressing with Mod I. OT Short Term Goal 2 (Week 1): Pt will maintain dynamic standing balance during funcitonal ADL with Mod I. OT Short Term Goal 3 (Week 1): Pt will increase memory recall with familiar tasks with min verbal cues. OT Short Term Goal 4 (Week 1): Pt will demonstrated selective attention to tasks with min verbal cueing.  Skilled Therapeutic Interventions/Progress Updates:   Pt greeted supine in bed requesting to shower, pt  agreeable to OT intervention. Session focus on BADL reeducation, functional mobility, dynamic standing balance and decreasing overall caregiver burden. Pt reporting she really wants to leave on Monday.                 Pt completed supine>sit with supervision, ambulatory toilet transfer with no AD with supervision. Pt completed 3/3 toileting tasks with supervision. Pt ambulated to shower with supervision pt completed bathing seated on shower seat with supervision. Pt exited shower with no Ad and supervision. Pt completed dressing from EOB with supervision. Pt left seated EOB with family present and MD present, all needs within reach.  Therapy Documentation Precautions:  Precautions Precautions: Fall Precaution Comments: SBP goal 130-150 Restrictions Weight Bearing Restrictions: No  Pain: no pain reported during session     Therapy/Group: Individual Therapy  Barron Schmid 01/24/2022, 12:18 PM

## 2022-01-24 NOTE — Progress Notes (Signed)
Physical Therapy Session Note  Patient Details  Name: Judy Walsh MRN: [id number] Date of Birth: 28-Oct-1975  Today's Date: 01/24/2022 PT Individual Time: 1502-1559 PT Individual Time Calculation (min): 57 min   Short Term Goals: Week 1:  PT Short Term Goal 1 (Week 1): STG=LTG due to ELOS.  Skilled Therapeutic Interventions/Progress Updates:     Pt received supine in bed and agrees to therapy. Reports pain in head where staples had just been removed, and asks if there is still a staple in scalp. PT inspects incision and notes that there is a stitch that is still intact at pt's area of reported pain. RN notified.   Pt performs bed mobility independently. Pt ambulates to dayroom, x300', without AD and with cues for navigation. Pt takes brief seated rest break. Pt completes Wii bowling activity to provide dynamic balance challenge in conjunction with cognitive task, as pt is required to synthesize visual input and perform large amplitude, multiplanar movements along with fine motor coordination of R upper extremity. PT initially provides pt with cueing on how to perform activity, but then allows pt to use critical thinking to navigate activity, which requires increased time and occasional verbal cues from PT. Pt also requires several rest breaks in sitting during activity.  Pt ambulates x800' with simultaneous task of naming supermarket items with each letter of the alphabet. Pt quickly names items from A through C, but has difficulty with remainder of alphabet, requiring mod cues throughout.   Pt left supine in bed with all needs within reach.  Therapy Documentation Precautions:  Precautions Precautions: Fall Precaution Comments: SBP goal 130-150 Restrictions Weight Bearing Restrictions: No    Therapy/Group: Individual Therapy  Beau Fanny, PT, DPT 01/24/2022, 5:49 PM

## 2022-01-25 NOTE — Progress Notes (Signed)
Speech Language Pathology Discharge Summary  Patient Details  Name: Judy Walsh MRN: [id number] Date of Birth: 03-10-1976  Today's Date: 01/25/2022 SLP Individual Time: 2824-1753 SLP Individual Time Calculation (min): 42 min  Skilled Therapeutic Interventions:  Skilled ST treatment focused on cognitive goals. SLP facilitated session by providing follow up education regarding cognitive-linguistic changes, strategies to maximize attention and memory, monitoring cognitive fatigue, and taking breaks. Pt demonstrated good insight into deficits and reported her concentration feels limited due to fatigue and memory is slowly progressing. Pt is feeling hopeful for further improvement, especially with further speech therapy in outpatient. Pt verbalized various compensatory memory strategies discussed during yesterday's education session with pt's mom. SLP provided with handout regarding cognitive-communication deficits for pt/mother per request. All education complete. No additional questions or concerns identified at this time. Patient was left in bed with immediate needs within reach at end of session. Pt made independent in room on 6/30.   Patient has met 7 of 7 long term goals.  Patient to discharge at overall Supervision level.  Reasons goals not met: All goals met   Clinical Impression/Discharge Summary: Patient has made excellent gains and has met 7 of 7 long-term goals this admission due to improved cognitive-linguistic skills in the areas of functional problem solving, attention, awareness, and memory. Patient is currently completing functional and complex cognitive tasks with sup A cues. Patient and family education is complete and patient to discharge at overall supervision level from a cognitive perspective. Patient's care partner is independent to provide the necessary cognitive assistance at discharge. Pt informed of recommendations to have assistance and supervision with all iADL tasks at time  of discharge given, as well as ongoing ST intervention in the OP setting to maximize cognitive function and functional independence; pt verbalized understanding.  Care Partner:  Caregiver Able to Provide Assistance: Yes  Type of Caregiver Assistance: Cognitive  Recommendation:  Outpatient SLP;24 hour supervision/assistance  Rationale for SLP Follow Up: Maximize cognitive function and independence;Reduce caregiver burden   Equipment: None recommended   Reasons for discharge: Discharged from hospital;Treatment goals met   Patient/Family Agrees with Progress Made and Goals Achieved: Yes    Hoyle Barkdull T Jediah Horger 01/25/2022, 12:15 PM

## 2022-01-25 NOTE — Plan of Care (Signed)
  Problem: RH Expression Communication Goal: LTG Patient will increase word finding of common (SLP) Description: LTG:  Patient will increase word finding of common objects/daily info/abstract thoughts with cues using compensatory strategies (SLP). Outcome: Completed/Met   Problem: RH Problem Solving Goal: LTG Patient will demonstrate problem solving for (SLP) Description: LTG:  Patient will demonstrate problem solving for basic/complex daily situations with cues  (SLP) Outcome: Completed/Met   Problem: RH Memory Goal: LTG Patient will follow step by step directions w/cues (SLP) Description: LTG: Patient will follow step by step directions with cues (SLP). Outcome: Completed/Met Goal: LTG Patient will demonstrate ability for day to day (SLP) Description: LTG:   Patient will demonstrate ability for day to day recall/carryover during cognitive/linguistic activities with assist  (SLP) Outcome: Completed/Met Goal: LTG Patient will use memory compensatory aids to (SLP) Description: LTG:  Patient will use memory compensatory aids to recall biographical/new, daily complex information with cues (SLP) Outcome: Completed/Met   Problem: RH Attention Goal: LTG Patient will demonstrate this level of attention during functional activites (SLP) Description: LTG:  Patient will will demonstrate this level of attention during functional activites (SLP) Outcome: Completed/Met   Problem: RH Awareness Goal: LTG: Patient will demonstrate awareness during functional activites type of (SLP) Description: LTG: Patient will demonstrate awareness during functional activites type of (SLP) Outcome: Completed/Met

## 2022-01-25 NOTE — Progress Notes (Signed)
PROGRESS NOTE   Subjective/Complaints:  Pt reports has a "crick I nher neck"- asking for pain meds from nursing- just asked.  Wants a shower- says leaving Monday Has IBS- LBM yesterday  ROS:  Pt denies SOB, abd pain, CP, N/V/C/D, and vision changes  Objective:   No results found.  No results for input(s): "WBC", "HGB", "HCT", "PLT" in the last 72 hours.  No results for input(s): "NA", "K", "CL", "CO2", "GLUCOSE", "BUN", "CREATININE", "CALCIUM" in the last 72 hours.   Intake/Output Summary (Last 24 hours) at 01/25/2022 0948 Last data filed at 01/25/2022 0740 Gross per 24 hour  Intake 840 ml  Output --  Net 840 ml        Physical Exam: Vital Signs Blood pressure 102/77, pulse 76, temperature 97.8 F (36.6 C), temperature source Oral, resp. rate 14, height 5\' 8"  (1.727 m), weight 74.2 kg, SpO2 100 %.    General: awake, alert, appropriate, sitting up in bed; rubbing neck; NAD HENT: conjugate gaze; oropharynx moist CV: regular rate; no JVD Pulmonary: CTA B/L; no W/R/R- good air movement GI: soft, NT, ND, (+)BS Psychiatric: appropriate- slightly impulsive Neurological: alert- tangential Skin: Clean and intact without signs of breakdown, staples intact on scalp Neuro:  Pt is alert. Oriented. Still somewhat distracted. Fair awareness and memory. Motor 4-5/5. No focal sensory deficits Musculoskeletal: low back tender   Assessment/Plan: 1. Functional deficits which require 3+ hours per day of interdisciplinary therapy in a comprehensive inpatient rehab setting. Physiatrist is providing close team supervision and 24 hour management of active medical problems listed below. Physiatrist and rehab team continue to assess barriers to discharge/monitor patient progress toward functional and medical goals  Care Tool:  Bathing    Body parts bathed by patient: Right arm, Left upper leg, Right lower leg, Left arm, Chest, Left  lower leg, Abdomen, Face, Front perineal area, Buttocks, Right upper leg         Bathing assist Assist Level: Supervision/Verbal cueing     Upper Body Dressing/Undressing Upper body dressing   What is the patient wearing?: Pull over shirt    Upper body assist Assist Level: Set up assist    Lower Body Dressing/Undressing Lower body dressing      What is the patient wearing?: Underwear/pull up, Pants     Lower body assist Assist for lower body dressing: Supervision/Verbal cueing     Toileting Toileting Toileting Activity did not occur (Clothing management and hygiene only):  (pt used bathroom)  Toileting assist Assist for toileting: Supervision/Verbal cueing     Transfers Chair/bed transfer  Transfers assist     Chair/bed transfer assist level: Independent     Locomotion Ambulation   Ambulation assist      Assist level: Supervision/Verbal cueing Assistive device: No Device Max distance: 1134 ft   Walk 10 feet activity   Assist     Assist level: Supervision/Verbal cueing Assistive device: No Device   Walk 50 feet activity   Assist    Assist level: Supervision/Verbal cueing Assistive device: No Device    Walk 150 feet activity   Assist    Assist level: Supervision/Verbal cueing Assistive device: No Device    Walk  10 feet on uneven surface  activity   Assist     Assist level: Supervision/Verbal cueing     Wheelchair     Assist Is the patient using a wheelchair?: No             Wheelchair 50 feet with 2 turns activity    Assist            Wheelchair 150 feet activity     Assist          Blood pressure 102/77, pulse 76, temperature 97.8 F (36.6 C), temperature source Oral, resp. rate 14, height 5\' 8"  (1.727 m), weight 74.2 kg, SpO2 100 %.  Medical Problem List and Plan: 1. 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  Status  post coil embolization 01/07/2022 per Dr. 01/09/2022.             -patient may shower             -ELOS/Goals: 5-7 days, mod I to supervision goals with PT, OT, SLP            -Con't CIR- PT, OT and SLP- wants shower- will d/w nursing 2.  Antithrombotics: -DVT/anticoagulation:  Pharmaceutical: Lovenox initiated 01/14/2022             -antiplatelet therapy: N/A 3. Pain Management: Neurontin 200 mg every 8 hours, Robaxin as needed, Advil 400 mg every 6 hours as needed headache             -continue topamax 25mg  bid  --it has helped headaches  -dc gabapentin after next 2 doses  - kpad for low back  7/1- "crick in her neck"- wil give her muscle relaxant and Ibuprofen 4. Mood/Sleep: Melatonin 5 mg nightly.  Provide emotional support             -antipsychotic agents: N/A  -scheduled trazodone has helped sleep 5. Neuropsych/cognition: This patient is capable of making decisions on her own behalf. 6. Skin/Wound Care: remove scalp staples today 7. Fluids/Electrolytes/Nutrition: encourage PO    lab work is within normal limits.  8.  Seizure prophylaxis.  Keppra 500 mg twice daily 9.  Hypertension.Nimotop protocol             -bp has generally been controlled. Some increase in DBP 6/30  7/1- BP well controlled- actually slightly soft, no hypotensive Sx's- con't regimen 10.  Myopericarditis.  Follow-up outpatient Dr. 7/30             -pt seems currently asymptomatic    LOS: 4 days A FACE TO FACE EVALUATION WAS PERFORMED  Tabb Croghan 01/25/2022, 9:48 AM

## 2022-01-26 NOTE — Progress Notes (Signed)
Occupational Therapy Session Note  Patient Details  Name: Judy Walsh MRN: [id number] Date of Birth: Aug 15, 1975  Today's Date: 01/26/2022 OT Individual Time: 1015-1115 & 1300-1400 OT Individual Time Calculation (min): 60 min & 60 min   Short Term Goals: Week 1:  OT Short Term Goal 1 (Week 1): Pt will complete LB dressing with Mod I. OT Short Term Goal 2 (Week 1): Pt will maintain dynamic standing balance during funcitonal ADL with Mod I. OT Short Term Goal 3 (Week 1): Pt will increase memory recall with familiar tasks with min verbal cues. OT Short Term Goal 4 (Week 1): Pt will demonstrated selective attention to tasks with min verbal cueing.  Skilled Therapeutic Interventions/Progress Updates:  Session 1:   Upon OT arrival, pt sleeping in bed. Pt was able to be awakened easily by voice. Pt agreeable to OT treatment session and reports minimal pain in head 2/10. Pt completes supine to sit transfer independently and ambulates to bathroom independently to complete toilet transfer and toileting independently. Pt washes hands at sink independently. Pt engages in community reintegration task navigating through the hospital to locate the cafeteria and outside of the atrium. Pt demonstrates min difficulty to multitask, communicating with therapist and focusing on managing elevator and locating guide of hospital. Pt able to locate outside of atrium and sits outside on bench to rest independently. Pt then requests directions from hospital worker to locate cafeteria and continues to have min difficulty. Pt requires min verbal cues and locates cafeteria with extended time. Pt returns to rehab unit and her room independently. Pt removes her dirty bed linens and makes her bed with clean linen with increased time noting fatigue at end of task. Pt completes task independently. Pt left in bed at end of session with all needs met.   Session 2: Upon OT arrival, pt semi recumbent in bed waking up with c/o the  food here. Pt reports she "called her dad to bring her a BLT". Pt agreeable to OT treatment session and reports no pain but that she is sleeping too much. Pt completes supine to sit Independently and eats her lunch independently. Pt completes sit to stand transfer independently and ambulates to the bathroom to complete toilet transfer and toileting independently. Pt washes her hands independently then ambulates to the ortho gym to engage in higher level cognitive task. Pt engaged in game of Rummikub using B UE with this therapist which requires the ability to sequence, count, and group game tiles together. Pt requires increased time for processing but demonstrates ability to problem solve and have good attention to task. Pt ambulates back to her independently and completes sit to supine transfer independently. Pt left in bed at end of session with all needs met. Pt progressing towards stated OT goals and continues to benefit from OT services to achieve highest level of independence.   Therapy Documentation Precautions:  Precautions Precautions: Fall Precaution Comments: SBP goal 130-150 Restrictions Weight Bearing Restrictions: No   Therapy/Group: Individual Therapy  Jeren Dufrane 01/26/2022, 12:12 PM

## 2022-01-26 NOTE — Progress Notes (Signed)
PROGRESS NOTE   Subjective/Complaints:  Pt is mod I in room- d/c tomorrow.  Has daily HA- as usual, but ready to get in shower- family member in room   ROS:   Pt denies SOB, abd pain, CP, N/V/C/D, and vision changes  Objective:   No results found.  No results for input(s): "WBC", "HGB", "HCT", "PLT" in the last 72 hours.  No results for input(s): "NA", "K", "CL", "CO2", "GLUCOSE", "BUN", "CREATININE", "CALCIUM" in the last 72 hours.   Intake/Output Summary (Last 24 hours) at 01/26/2022 1007 Last data filed at 01/26/2022 6269 Gross per 24 hour  Intake 1320 ml  Output --  Net 1320 ml        Physical Exam: Vital Signs Blood pressure 107/79, pulse 76, temperature 98.1 F (36.7 C), temperature source Oral, resp. rate 20, height 5\' 8"  (1.727 m), weight 74.2 kg, SpO2 98 %.     General: awake, alert, appropriate, NAD HENT: conjugate gaze; oropharynx moist CV: regular rate; no JVD Pulmonary: CTA B/L; no W/R/R- good air movement GI: soft, NT, ND, (+)BS Psychiatric: appropriate- calmer- less tangential Neurological: Ox3  Skin: Clean and intact without signs of breakdown, staples intact on scalp Neuro:  Pt is alert. Oriented. Still somewhat distracted. Fair awareness and memory. Motor 4-5/5. No focal sensory deficits Musculoskeletal: low back tender   Assessment/Plan: 1. Functional deficits which require 3+ hours per day of interdisciplinary therapy in a comprehensive inpatient rehab setting. Physiatrist is providing close team supervision and 24 hour management of active medical problems listed below. Physiatrist and rehab team continue to assess barriers to discharge/monitor patient progress toward functional and medical goals  Care Tool:  Bathing    Body parts bathed by patient: Right arm, Left upper leg, Right lower leg, Left arm, Chest, Left lower leg, Abdomen, Face, Front perineal area, Buttocks, Right upper  leg         Bathing assist Assist Level: Supervision/Verbal cueing     Upper Body Dressing/Undressing Upper body dressing   What is the patient wearing?: Pull over shirt    Upper body assist Assist Level: Set up assist    Lower Body Dressing/Undressing Lower body dressing      What is the patient wearing?: Underwear/pull up, Pants     Lower body assist Assist for lower body dressing: Supervision/Verbal cueing     Toileting Toileting Toileting Activity did not occur (Clothing management and hygiene only):  (pt used bathroom)  Toileting assist Assist for toileting: Supervision/Verbal cueing     Transfers Chair/bed transfer  Transfers assist     Chair/bed transfer assist level: Independent     Locomotion Ambulation   Ambulation assist      Assist level: Independent Assistive device: No Device Max distance: 1795 ft   Walk 10 feet activity   Assist     Assist level: Independent Assistive device: No Device   Walk 50 feet activity   Assist    Assist level: Independent Assistive device: No Device    Walk 150 feet activity   Assist    Assist level: Independent Assistive device: No Device    Walk 10 feet on uneven surface  activity  Assist     Assist level: Independent     Wheelchair     Assist Is the patient using a wheelchair?: No             Wheelchair 50 feet with 2 turns activity    Assist            Wheelchair 150 feet activity     Assist          Blood pressure 107/79, pulse 76, temperature 98.1 F (36.7 C), temperature source Oral, resp. rate 20, height 5\' 8"  (1.727 m), weight 74.2 kg, SpO2 98 %.  Medical Problem List and Plan: 1. 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  Status post coil embolization 01/07/2022 per Dr. 01/09/2022.             -patient may shower             -ELOS/Goals: 5-7 days, mod I to supervision  goals with PT, OT, SLP           Con't CIR- PT, OT and SLP- d/c tomorrow 2.  Antithrombotics: -DVT/anticoagulation:  Pharmaceutical: Lovenox initiated 01/14/2022             -antiplatelet therapy: N/A 3. Pain Management: Neurontin 200 mg every 8 hours, Robaxin as needed, Advil 400 mg every 6 hours as needed headache             -continue topamax 25mg  bid  --it has helped headaches  -dc gabapentin after next 2 doses  - kpad for low back  7/1- "crick in her neck"- wil give her muscle relaxant and Ibuprofen  7/2- feeling better- con't regimen prn 4. Mood/Sleep: Melatonin 5 mg nightly.  Provide emotional support             -antipsychotic agents: N/A  -scheduled trazodone has helped sleep 5. Neuropsych/cognition: This patient is capable of making decisions on her own behalf. 6. Skin/Wound Care: remove scalp staples today 7. Fluids/Electrolytes/Nutrition: encourage PO    lab work is within normal limits.  8.  Seizure prophylaxis.  Keppra 500 mg twice daily 9.  Hypertension.Nimotop protocol             -bp has generally been controlled. Some increase in DBP 6/30  7/1-7/2- BP slightly soft, but asymptomatic- con't regimen 10.  Myopericarditis.  Follow-up outpatient Dr. 7/30             -pt seems currently asymptomatic    LOS: 5 days A FACE TO FACE EVALUATION WAS PERFORMED  Judy Walsh 01/26/2022, 10:07 AM

## 2022-01-26 NOTE — Progress Notes (Signed)
Physical Therapy Discharge Summary  Patient Details  Name: Judy Walsh MRN: [id number] Date of Birth: 1976/07/14  Today's Date: 01/26/2022 PT Individual Time: 0800-0915 PT Individual Time Calculation (min): 75 min    Patient has met 8 of 8 long term goals due to improved activity tolerance, improved balance, improved postural control, decreased pain, ability to compensate for deficits, improved attention, and improved awareness.  Patient to discharge at an ambulatory level Independent without an AD for household mobility and with supervision for community mobility and cognition.   Patient's care partner is independent to provide the necessary cognitive assistance at discharge.  Reasons goals not met: All PT goals met at this time.  Recommendation:  Patient will benefit from ongoing skilled PT services in outpatient setting to continue to advance safe functional mobility, address ongoing impairments in activity tolerance, community integration, return to work conditioning, dual task training, BP management with activity, patient/caregiver education, and minimize fall risk.  Equipment: No equipment provided  Reasons for discharge: treatment goals met  Patient/family agrees with progress made and goals achieved: Yes  Skilled Therapeutic Intervention: Patient in chair in the room with her son and DIL in the room upon PT arrival. Patient alert and agreeable to PT session. Patient denied pain during session.ession. Patient denied pain during session.  Patient's son and DIL present for family education and hands on training throughout session. Performed safe guarding with all mobility following PT cues and/or demonstration. Educated on fall risk/prevention, home modifications to prevent falls, and activation of emergency services in the event of a fall during session.   Therapeutic Activity: Bed Mobility: Patient performed supine to/from independently in a flat bed without use of bed  rails. Transfers: Patient performed sit to/from stand from various household surfaces independently throughout session.  Patient performed a simulated sedan height car transfer independently without an AD. PT demonstrated a floor transfer from sitting<>lying. Educated on signs and symptoms to assess during self-assessment in the event of a fall and symptoms that indicate immediate medical attention. Instructed patient to have his phone in his pocket at all times to have access to call emergency services and not to get up if any sign of critical injury, symptoms, or LOC. Then instructed patient if no signs or symptoms present, he should call for assistance from another person, crawl to the nearest stable place to sit and perform a transfer from the floor to the seat, not to standing for safety to reduce risk of a second fall. Patient then performed a floor transfer from standing<>mat on the floor independently demonstrating good technique following PT instruction.   Gait Training:  Patient ambulated throughout the department independently without an AD without LOB or gait deviations. Patient ascended/descended 16x6" steps without use of rails independently. Performed reciprocal gait pattern throughout. Educated on use of rail when available for safety and restricting dual tasks on stairs (talking, carrying objects, etc) at this time for safety. Plan to progress with OPPT with dual task challenges.  Patient ambulated up/down a ramp, over 10 feet of mulch (unlevel surface), and up/down a curb to simulate community ambulation over unlevel surfaces independently without an AD. Educated on increased fall risk with increased environmental stimulation in the community and to have supervision for safety initially and remember to focus on safety and balance first over secondary tasks and environmental distracters to reduce fall risk, patient in agreement.   6 Min Walk Test:  Instructed patient to ambulate as  quickly and as safely as possible for  6 minutes using LRAD. Patient was allowed to take standing rest breaks without stopping the test, but if the patient required a sitting rest break the clock would be stopped and the test would be over.  Results: 1795 feet (547 meters, Avg speed 1.5 m/s) independently without an AD. Results indicate that the patient has endurance [facility] for age matched norms with ambulation.  Age Matched Norms: 22-69 yo F: 538 meters MDC: 58.21 meters (190.98 feet) or 50 meters (ANPTA Core Set of Outcome Measures for Adults with Neurologic Conditions, 2018)  Patient in room at end of session with breaks locked, patient independent in the room per therapy issued certificate on her door. Patient wrote down activities performed form therapy session without error in her memory notebook (paper provided as notebook was taken home by family).    PT Discharge Precautions/Restrictions Precautions Precautions: Fall Precaution Comments: SBP goal 130-150 Restrictions Weight Bearing Restrictions: No Pain Interference Pain Interference Pain Effect on Sleep: 2. Occasionally Pain Interference with Therapy Activities: 2. Occasionally Pain Interference with Day-to-Day Activities: 2. Occasionally Vision/Perception  Vision - Assessment Eye Alignment: Within Functional Limits Ocular Range of Motion: Within Functional Limits Alignment/Gaze Preference: Within Defined Limits Tracking/Visual Pursuits: Able to track stimulus in all quads without difficulty Saccades: Within functional limits Convergence: Within functional limits Additional Comments: slowed protective responses and limited pupillary constriction Perception Perception: Within Functional Limits Praxis Praxis: Intact  Cognition Overall Cognitive Status: Impaired/Different from baseline Arousal/Alertness: Awake/alert Orientation Level: Oriented X4 Year: 2023 Month: July Day of Week: Correct Focused Attention: Appears  intact Sustained Attention: Impaired Sustained Attention Impairment: Verbal complex;Functional complex Selective Attention: Impaired Selective Attention Impairment: Verbal basic;Functional basic;Functional complex Memory: Impaired Memory Impairment: Retrieval deficit;Decreased recall of new information;Decreased short term memory Decreased Short Term Memory: Verbal basic;Functional basic Awareness: Appears intact Safety/Judgment: Impaired Comments: Slight overestimation of abilities Sensation Sensation Light Touch: Appears Intact Hot/Cold: Appears Intact Proprioception: Appears Intact Coordination Gross Motor Movements are Fluid and Coordinated: Yes Fine Motor Movements are Fluid and Coordinated: Yes Motor  Motor Motor: Within Functional Limits  Mobility Bed Mobility Bed Mobility: Rolling Left;Sit to Supine;Supine to Sit;Rolling Right Rolling Right: Independent Rolling Left: Independent Supine to Sit: Independent Sit to Supine: Independent Transfers Transfers: Sit to Stand;Stand to Sit;Stand Pivot Transfers Sit to Stand: Independent Stand to Sit: Independent Stand Pivot Transfers: Independent Transfer (Assistive device): None Locomotion  Gait Ambulation: Yes Gait Assistance: Independent Gait Distance (Feet): 1795 Feet (during 6MWT) Assistive device: None Gait Gait: Yes Gait Pattern: Within Functional Limits Gait velocity: WFL, 1.5 m/s avg on 6MWT Stairs / Additional Locomotion Stairs: Yes Stairs Assistance: Independent Stair Management Technique: No rails Number of Stairs: 16 Height of Stairs: 6 Ramp: Independent Curb: Independent Wheelchair Mobility Wheelchair Mobility: No  Trunk/Postural Assessment  Cervical Assessment Cervical Assessment: Within Functional Limits Thoracic Assessment Thoracic Assessment: Within Functional Limits Lumbar Assessment Lumbar Assessment: Within Functional Limits Postural Control Postural Control: Within Functional Limits   Balance Standardized Balance Assessment Standardized Balance Assessment: Berg Balance Test;Functional Gait Assessment Berg Balance Test Sit to Stand: Able to stand without using hands and stabilize independently Standing Unsupported: Able to stand safely 2 minutes Sitting with Back Unsupported but Feet Supported on Floor or Stool: Able to sit safely and securely 2 minutes Stand to Sit: Sits safely with minimal use of hands Transfers: Able to transfer safely, minor use of hands Standing Unsupported with Eyes Closed: Able to stand 10 seconds safely Standing Ubsupported with Feet Together: Able to place feet together independently and stand  1 minute safely From Standing, Reach Forward with Outstretched Arm: Can reach confidently >25 cm (10") From Standing Position, Pick up Object from Floor: Able to pick up shoe safely and easily From Standing Position, Turn to Look Behind Over each Shoulder: Looks behind from both sides and weight shifts well Turn 360 Degrees: Able to turn 360 degrees safely in 4 seconds or less Standing Unsupported, Alternately Place Feet on Step/Stool: Able to stand independently and safely and complete 8 steps in 20 seconds Standing Unsupported, One Foot in Front: Able to place foot tandem independently and hold 30 seconds Standing on One Leg: Able to lift leg independently and hold > 10 seconds Total Score: 56 Static Sitting Balance Static Sitting - Level of Assistance: 7: Independent Dynamic Sitting Balance Dynamic Sitting - Level of Assistance: 7: Independent Static Standing Balance Static Standing - Level of Assistance: 7: Independent Dynamic Standing Balance Dynamic Standing - Level of Assistance: 7: Independent Functional Gait  Assessment Gait Level Surface: Walks 20 ft in less than 5.5 sec, no assistive devices, good speed, no evidence for imbalance, normal gait pattern, deviates no more than 6 in outside of the 12 in walkway width. Change in Gait Speed: Able  to smoothly change walking speed without loss of balance or gait deviation. Deviate no more than 6 in outside of the 12 in walkway width. Gait with Horizontal Head Turns: Performs head turns smoothly with no change in gait. Deviates no more than 6 in outside 12 in walkway width Gait with Vertical Head Turns: Performs head turns with no change in gait. Deviates no more than 6 in outside 12 in walkway width. Gait and Pivot Turn: Pivot turns safely within 3 sec and stops quickly with no loss of balance. Step Over Obstacle: Is able to step over 2 stacked shoe boxes taped together (9 in total height) without changing gait speed. No evidence of imbalance. Gait with Narrow Base of Support: Is able to ambulate for 10 steps heel to toe with no staggering. Gait with Eyes Closed: Walks 20 ft, no assistive devices, good speed, no evidence of imbalance, normal gait pattern, deviates no more than 6 in outside 12 in walkway width. Ambulates 20 ft in less than 7 sec. Ambulating Backwards: Walks 20 ft, uses assistive device, slower speed, mild gait deviations, deviates 6-10 in outside 12 in walkway width. Steps: Alternating feet, no rail. Total Score: 29 Extremity Assessment  RLE Assessment RLE Assessment: Within Functional Limits General Strength Comments: Grossly 5/5 throughout in sitting LLE Assessment LLE Assessment: Within Functional Limits General Strength Comments: Grossly 5/5 throughout in sitting    Duan Scharnhorst L Kaniah Rizzolo PT, DPT  01/26/2022, 1:00 PM

## 2022-01-27 MED ORDER — TRAMADOL HCL 50 MG PO TABS: 50.0000 mg | ORAL_TABLET | Freq: Four times a day (QID) | ORAL | 0 refills | Status: DC | PRN

## 2022-01-27 MED ORDER — TOPIRAMATE 25 MG PO TABS: 25.0000 mg | ORAL_TABLET | Freq: Two times a day (BID) | ORAL | 0 refills | Status: DC

## 2022-01-27 MED ORDER — LEVETIRACETAM 500 MG PO TABS: 500.0000 mg | ORAL_TABLET | Freq: Two times a day (BID) | ORAL | 0 refills | Status: DC

## 2022-01-27 MED ORDER — METHOCARBAMOL 500 MG PO TABS: 500.0000 mg | ORAL_TABLET | Freq: Four times a day (QID) | ORAL | 0 refills | Status: DC | PRN

## 2022-01-27 MED ORDER — FOLIC ACID 1 MG PO TABS: 1.0000 mg | ORAL_TABLET | Freq: Every day | ORAL | 0 refills | Status: DC

## 2022-01-27 MED ORDER — ADULT MULTIVITAMIN W/MINERALS CH: 1.0000 | ORAL_TABLET | Freq: Every day | ORAL | Status: AC

## 2022-01-27 MED ORDER — POLYETHYLENE GLYCOL 3350 17 G PO PACK: 17.0000 g | PACK | Freq: Every day | ORAL | 0 refills | Status: DC

## 2022-01-27 MED ORDER — MELATONIN 5 MG PO TABS: 5.0000 mg | ORAL_TABLET | Freq: Every day | ORAL | 0 refills | Status: DC

## 2022-01-27 NOTE — Progress Notes (Signed)
Inpatient Rehabilitation Discharge Medication Review by a Pharmacist  A complete drug regimen review was completed for this patient to identify any potential clinically significant medication issues.  High Risk Drug Classes Is patient taking? Indication by Medication  Antipsychotic No   Anticoagulant No   Antibiotic No   Opioid No   Antiplatelet No   Hypoglycemics/insulin No   Vasoactive Medication No   Chemotherapy No   Other Yes Keppra for seizure ppx Robaxin prn spasms Topamax for HAs Melatonin for sleep     Type of Medication Issue Identified Description of Issue Recommendation(s)  Drug Interaction(s) (clinically significant)     Duplicate Therapy     Allergy     No Medication Administration End Date  Keppra for seizure prophylaxis has no stop date Consider clarify LOT with NSGY  Incorrect Dose     Additional Drug Therapy Needed     Significant med changes from prior encounter (inform family/care partners about these prior to discharge).    Other       Clinically significant medication issues were identified that warrant physician communication and completion of prescribed/recommended actions by midnight of the next day:  Yes   Pharmacist comments: Sent secure chat to Deatra Ina re: Keppra - was viewed but received no response back   Time spent performing this drug regimen review (minutes): 20 minutes  Thank you for involving pharmacy in this patient's care.  Loura Back, PharmD, BCPS Clinical Pharmacist Clinical phone for 01/27/2022 until 3p is [phone] 01/27/2022 7:48 AM

## 2022-01-27 NOTE — Progress Notes (Signed)
Pt discharged off unit with family and with all belongings. Patient education given by Jesusita Oka PA.   Rayburn Ma

## 2022-01-27 NOTE — Patient Care Conference (Cosign Needed Addendum)
Inpatient RehabilitationTeam Conference and Plan of Care Update Date: 01/27/2022   Time: 9:00 AM    Patient Name: Judy Walsh      Medical Record Number: [id number]  Date of Birth: 01/31/1976 Sex: Female         Room/Bed: 4M04C/4M04C-01 Payor Info: Payor: [employer] / Plan: [employer] NAP / Product Type: *No Product type* /    Admit Date/Time:  01/21/2022  4:09 PM  Primary Diagnosis:  Subarachnoid hemorrhage (HCC)  Hospital Problems: Principal Problem:   Subarachnoid hemorrhage (HCC)    Expected Discharge Date: Expected Discharge Date: 01/27/22  Team Members Present: Physician leading conference: Dr. Zachary Swartz Social Worker Present: Christina Baskerville, BSW Nurse Present:  , RN PT Present: Cherie Grunenberg, PT OT Present: Sandra Davis, OT SLP Present: Bri Garretson, SLP     Current Status/Progress Goal Weekly Team Focus  Bowel/Bladder   continent b/b  remain continent  toilet as needed   Swallow/Nutrition/ Hydration             ADL's   Mod I with ADLs, (S) with cognition. family edu complete  Mod I overall, supervision with cognition  family education, cognitive retraining, dynamic balance   Mobility   Independent household mobility, supervision community mobility  Independent household mobility, supervision community mobility  d/c assessment and family education completed, focus on community integration, dual task training, safety awareness, and return to work conditionin with [employer]   Communication             Safety/Cognition/ Behavioral Observations  sup A  min A  education with family, memory, attention, problem solving, awareness   Pain   no reported pain  < 3  assess pain q 4 hr and prn   Skin   CDI  no new breakdown  assess skin q shift and prn     Discharge Planning:  Patient discharging home today with parents.   Team Discussion: No new issues. Mild h/a. Excited about discharge today. "Soft" spot on scalp she's noticed. Continent B/B, no  reported pain. Incision to scalp CDI with no s/s infection. Discharging home with family.  Patient on target to meet rehab goals: yes, mod I goals. All goals met.  *See Care Plan and progress notes for long and short-term goals.   Revisions to Treatment Plan:  Finalizing discharge plans.   Teaching Needs: Family education complete.   Current Barriers to Discharge: No current barriers  Possible Resolutions to Barriers: All barriers addressed     Medical Summary Current Status: rupture p-com, a-com aneurysms s/p coiling. vascular headaches. improved bp control. cognition improving but still impulsive, distracted at timtes  Barriers to Discharge: Medical stability   Possible Resolutions to Barriers/Weekly Focus: treated headache, stabilized bp and cv parameters. ready for dc today   Continued Need for Acute Rehabilitation Level of Care: The patient requires daily medical management by a physician with specialized training in physical medicine and rehabilitation for the following reasons: Direction of a multidisciplinary physical rehabilitation program to maximize functional independence : Yes Medical management of patient stability for increased activity during participation in an intensive rehabilitation regime.: Yes Analysis of laboratory values and/or radiology reports with any subsequent need for medication adjustment and/or medical intervention. : Yes   I attest that I was present, lead the team conference, and concur with the assessment and plan of the team.   ,  G 01/27/2022, 2:17 PM        

## 2022-01-27 NOTE — Progress Notes (Signed)
PROGRESS NOTE   Subjective/Complaints:  No new issues. Mild h/a. Excited about discharge. "Soft" spot on scalp she's noticed.   ROS: Patient denies fever, rash, sore throat, blurred vision, dizziness, nausea, vomiting, diarrhea, cough, shortness of breath or chest pain, joint or back/neck pain, headache, or mood change.   Objective:   No results found.  No results for input(s): "WBC", "HGB", "HCT", "PLT" in the last 72 hours.  No results for input(s): "NA", "K", "CL", "CO2", "GLUCOSE", "BUN", "CREATININE", "CALCIUM" in the last 72 hours.   Intake/Output Summary (Last 24 hours) at 01/27/2022 0932 Last data filed at 01/26/2022 2152 Gross per 24 hour  Intake 240 ml  Output --  Net 240 ml        Physical Exam: Vital Signs Blood pressure 125/80, pulse 72, temperature 98.6 F (37 C), temperature source Oral, resp. rate 16, height 5\' 8"  (1.727 m), weight 74.2 kg, SpO2 99 %.     Constitutional: No distress . Vital signs reviewed. HEENT: NCAT, EOMI, oral membranes moist Neck: supple Cardiovascular: RRR without murmur. No JVD    Respiratory/Chest: CTA Bilaterally without wheezes or rales. Normal effort    GI/Abdomen: BS +, non-tender, non-distended Ext: no clubbing, cyanosis, or edema Psych: pleasant and cooperative  Skin: Clean and intact without signs of breakdown, staples intact on scalp Neuro:  Pt is alert. Oriented. Still somewhat distracted. Fair awareness and memory. Motor 4-5/5. No focal sensory deficits Musculoskeletal: low back tender   Assessment/Plan: 1. Functional deficits which require 3+ hours per day of interdisciplinary therapy in a comprehensive inpatient rehab setting. Physiatrist is providing close team supervision and 24 hour management of active medical problems listed below. Physiatrist and rehab team continue to assess barriers to discharge/monitor patient progress toward functional and medical  goals  Care Tool:  Bathing    Body parts bathed by patient: Right arm, Left upper leg, Right lower leg, Left arm, Chest, Left lower leg, Abdomen, Face, Front perineal area, Buttocks, Right upper leg         Bathing assist Assist Level: Supervision/Verbal cueing     Upper Body Dressing/Undressing Upper body dressing   What is the patient wearing?: Pull over shirt    Upper body assist Assist Level: Set up assist    Lower Body Dressing/Undressing Lower body dressing      What is the patient wearing?: Underwear/pull up, Pants     Lower body assist Assist for lower body dressing: Supervision/Verbal cueing     Toileting Toileting Toileting Activity did not occur (Clothing management and hygiene only):  (pt used bathroom)  Toileting assist Assist for toileting: Independent     Transfers Chair/bed transfer  Transfers assist     Chair/bed transfer assist level: Independent     Locomotion Ambulation   Ambulation assist      Assist level: Independent Assistive device: No Device Max distance: 1795 ft   Walk 10 feet activity   Assist     Assist level: Independent Assistive device: No Device   Walk 50 feet activity   Assist    Assist level: Independent Assistive device: No Device    Walk 150 feet activity   Assist  Assist level: Independent Assistive device: No Device    Walk 10 feet on uneven surface  activity   Assist     Assist level: Independent     Wheelchair     Assist Is the patient using a wheelchair?: No             Wheelchair 50 feet with 2 turns activity    Assist            Wheelchair 150 feet activity     Assist          Blood pressure 125/80, pulse 72, temperature 98.6 F (37 C), temperature source Oral, resp. rate 16, height 5\' 8"  (1.727 m), weight 74.2 kg, SpO2 99 %.  Medical Problem List and Plan: 1. 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  Status post coil embolization 01/07/2022 per Dr. 01/09/2022.             -patient may shower             -dc home today. F/u with NS, [facility], primary 2.  Antithrombotics: -DVT/anticoagulation:  Pharmaceutical: Lovenox initiated 01/14/2022             -antiplatelet therapy: N/A 3. Pain Management: Neurontin 200 mg every 8 hours, Robaxin as needed,               -continue topamax 25mg  bid  --it has helped headaches  -off gabapentin  - kpad for low back   Will give her a few tramadol for severe pain. I would prefer that she doesn't use ibuprofen in the short term.  4. Mood/Sleep: Melatonin 5 mg nightly.  Provide emotional support             -antipsychotic agents: N/A  -scheduled trazodone has helped sleep 5. Neuropsych/cognition: This patient is capable of making decisions on her own behalf. 6. Skin/Wound Care: remove scalp staples today 7. Fluids/Electrolytes/Nutrition: encourage PO    lab work is within normal limits.  8.  Seizure prophylaxis.  Keppra 500 mg twice daily 9.  Hypertension.Nimotop protocol             -bp has generally been controlled. Some increase in DBP 6/30  7/3 bp stable 10.  Myopericarditis.  Follow-up outpatient Dr. 01/16/2022             -pt seems currently asymptomatic    LOS: 6 days A FACE TO FACE EVALUATION WAS PERFORMED  01/27/2022, 9:32 AM

## 2022-01-27 NOTE — Progress Notes (Signed)
Inpatient Rehabilitation Care Coordinator Discharge Note   Patient Details  Name: Judy Walsh MRN: [id number] Date of Birth: [birth year]   Discharge location: Home  Length of Stay: 6 Days  Discharge activity level: sup/Cga  Home/community participation: mother and father  Patient response KB:TCYELY Literacy - How often do you need to have someone help you when you read instructions, pamphlets, or other written material from your doctor or pharmacy?: Never  Patient response HT:MBPJPE Isolation - How often do you feel lonely or isolated from those around you?: Never  Services provided included: Pharmacy, TR, CM, RN, SLP, OT, PT, RD, MD  Financial Services:  Financial Services Utilized: [employer]  Choices offered to/list presented to: patient  Follow-up services arranged:  Outpatient    Outpatient Servicies: [facility]      Patient response to transportation need: Is the patient able to respond to transportation needs?: Yes In the past 12 months, has lack of transportation kept you from medical appointments or from getting medications?: No In the past 12 months, has lack of transportation kept you from meetings, work, or from getting things needed for daily living?: No    Comments (or additional information):  Patient/Family verbalized understanding of follow-up arrangements:  Yes  Individual responsible for coordination of the follow-up plan: self  Confirmed correct DME delivered: Andria Rhein 01/27/2022    Andria Rhein

## 2022-02-11 ENCOUNTER — Ambulatory Visit: Payer: 59 | Admitting: Nurse Practitioner

## 2022-02-12 ENCOUNTER — Encounter: Payer: Self-pay | Admitting: Registered Nurse

## 2022-02-12 ENCOUNTER — Encounter: Payer: 59 | Attending: Registered Nurse | Admitting: Registered Nurse

## 2022-02-12 VITALS — BP 132/91 | HR 85 | Ht 68.0 in | Wt 162.0 lb

## 2022-02-12 DIAGNOSIS — I609 Nontraumatic subarachnoid hemorrhage, unspecified: Secondary | ICD-10-CM | POA: Diagnosis not present

## 2022-02-12 MED ORDER — TOPIRAMATE 25 MG PO TABS: 25.0000 mg | ORAL_TABLET | Freq: Two times a day (BID) | ORAL | 0 refills | Status: DC

## 2022-02-12 NOTE — Progress Notes (Signed)
Subjective:    Patient ID: Judy Walsh, female    DOB: 1976/05/29, 46 y.o.   MRN: [id number]  HPI: Judy Walsh is a 46 y.o. female who is here for HFU appointment, for follow up of her Subarachnoid hemorrhage.  She presented to [facility] on 01/06/2022 via EMS with severe headache with nausea and vomiting.  Dr Rubin Payor H&P 01/06/2022 Patient presented with cute onset headache.  Was worried for subarachnoid hemorrhage with the history.  Initial head CT done and showed subarachnoid hemorrhage.  CTA added on.  Did show to likely aneurysms.  Discussed with Dr. Yetta Barre from neuroradiology.  I also independently interpreted the head CT.  Started on urgent blood pressure control after the initial head CT.  Also discussed with Dr. Iver Nestle from neurology although this will be more of a neurosurgery intervention.  Discussed with Dr. Conchita Paris once aneurysms were found and will discuss with primary neurosurgery on-call.  Continued headache.  Still somewhat slow to answer.  Called back to see patient.  [facility] was here to transport.  Now decreased responsiveness again and compared to before.  Nonverbal.  Not following commands for me.  Eyes may be moving more to the left.  Still breathing spontaneously however.  Will repeat head CT and with eyes movement particular to left we will get Keppra load.   Discussed with neurosurgery again.  Will intubate and transfer to ICU for drain.  Intubated without difficulty.  Chest x-ray interpreted at the bedside and showed good positioning of the ET tube.  NG tube not been placed at this time.  Propofol ordered and will be taken emergently to ICU.  Dr Wynetta Emery Note  Patient with decline in mental status needed to be intubated repeat CT scan showed rebleed subarachnoid hemorrhage and worsening hydrocephalus.  Patient was emergently transferred to [facility] and I extensively discussed with her son placement of a external ventricular drain that this was an emergent needed  procedure went over the risks and benefits perioperative course expectations of outcome and alternatives and he understood and agreed to proceed forward.  Dr Conchita Paris Note 01/07/2022 PLAN: - Proceed with diagnostic angiogram, appropriate treatment (coiling v clipping) of Acom and LPcom aneurysm. - Cont to monitor in ICU - Nimotop 60mg  Q4 hrs x 21 days - Routine TCD monitoring - Vent mgmt per PCCM   I have reviewed the imaging findings at this point with the patient's son and daughter at bedside.  We have reviewed the details of the angiogram procedure as well as possible coiling and surgical clipping.  Specifically, we did discuss risks of the procedure primarily being stroke or rehemorrhage.  Other risks including arterial dissection, groin hematoma, and contrast nephropathy as well as risk associated with craniotomy including seizure, hematoma, and need for additional surgeries were all discussed.  Patient's son appeared to understand our discussion and provided consent on the patient's behalf to proceed as above.  All his questions today were answered.     , MD [facility]     CT Head: WO Contrast IMPRESSION: 1. Moderate volume of acute subarachnoid hemorrhage, described above. Recommend CTA to evaluate for aneurysm. 2. Borderline ventriculomegaly. Recommend continued attention on follow-up to exclude developing hydrocephalus.   Critical findings discussed with Dr. Lisbeth Renshaw via telephone at 5:40 p.m.   CT Angio:  IMPRESSION: 1. Irregular, lobulated 5 x 4 mm outpouching rising from the left supraclinoid ICA, concerning for aneurysm that probably is ruptured given irregular morphology and  subarachnoid hemorrhage seen on same day CT head. 2. 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 3. No emergent large vessel occlusion or proximal hemodynamically significant stenosis.   Findings discussed with Dr. Rubin Payor via  telephone at 5:55 p.m.   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  Judy Walsh was admitted to inpatient rehabilitation on 01/21/2022 and discharged home on 01/27/2022. She reports she has a headache. She rates her pain 2. She asked this provider to find her another outpatient therapy, referral placed to Neuro-Rehabilitation , she verbalizes understanding.    This provider spoke with Judy Walsh father and daughter.   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  no  Physicians involved in your care Any changes since last visit?  no   Family History  Problem Relation Age of Onset   CVA Father    Social History   Socioeconomic History   Marital status: Single    Spouse name: Not on file   Number of children: Not on file   Years of education: Not on file   Highest education level: Not on file  Occupational History   Not on file  Tobacco Use   Smoking status: Former    Types: Cigarettes   Smokeless tobacco: Never  Vaping Use   Vaping Use: Never used  Substance and Sexual Activity   Alcohol use: Not Currently   Drug use: Never   Sexual activity: Not on file  Other Topics Concern   Not on file  Social History Narrative   Not on file   Social Determinants of Health   Financial Resource Strain: Not on file  Food Insecurity: Not on file  Transportation Needs: Not on file  Physical Activity: Not on file  Stress: Not on file  Social Connections: Not on file   Past Surgical  History:  Procedure Laterality Date   IR ANGIO INTRA EXTRACRAN SEL INTERNAL CAROTID BILAT MOD SED  01/07/2022   IR ANGIO VERTEBRAL SEL VERTEBRAL UNI R MOD SED  01/08/2022   IR ANGIOGRAM FOLLOW UP STUDY  01/07/2022   IR ANGIOGRAM FOLLOW UP STUDY  01/07/2022   IR ANGIOGRAM FOLLOW UP STUDY  01/07/2022   IR ANGIOGRAM FOLLOW UP STUDY  01/07/2022   IR NEURO EACH ADD'L AFTER BASIC UNI LEFT (MS)  01/08/2022   IR NEURO EACH ADD'L AFTER BASIC UNI RIGHT (MS)  01/08/2022   IR TRANSCATH/EMBOLIZ  01/07/2022   RADIOLOGY WITH ANESTHESIA N/A 01/07/2022   Procedure: IR WITH ANESTHESIA;  Surgeon: Lisbeth Renshaw, MD;  Location: [facility] OR;  Service: Radiology;  Laterality: N/A;   Past Medical History:  Diagnosis Date   Chest x-ray abnormality 06/09/2021   CXR 11/22: Vague nodular opacities in the mid left lung and right upper lobe which may be due to overlapping structures. Comparison with older imaging studies or follow-up is recommended to exclude pulmonary nodule.    Myopericarditis 06/08/2021   admx 11/22 >> hsTrop mildly elevated w/o trend; BNP and CRP high - trending down at DC // Echocardiogram 11/22:  no effusion, EF 55-60, no RWMA, mild LVH >> NSAIDs/Colchicine   BP (!) 127/92   Pulse 93   Ht 5\' 8"  (1.727 m)   Wt 162 lb (73.5 kg)   SpO2 98%   BMI 24.63 kg/m   Opioid Risk Score:   Fall Risk Score:  `1  Depression screen [facility] 2/9     02/12/2022   11:18 AM  Depression screen PHQ 2/9  Decreased Interest 0  Down, Depressed, Hopeless 0  PHQ - 2 Score 0  Altered sleeping 1  Tired, decreased energy 1  Change in appetite 0  Feeling bad or failure about yourself  0  Trouble concentrating 1  Moving slowly or fidgety/restless 0  Suicidal thoughts 0  PHQ-9 Score 3    Review of Systems  Neurological:  Positive for headaches.  All other systems reviewed and are negative.      Objective:   Physical Exam Vitals and nursing note reviewed.  Constitutional:      Appearance: Normal appearance.   Cardiovascular:     Rate and Rhythm: Normal rate and regular rhythm.     Pulses: Normal pulses.     Heart sounds: Normal heart sounds.  Pulmonary:     Effort: Pulmonary effort is normal.     Breath sounds: Normal breath sounds.  Musculoskeletal:     Cervical back: Normal range of motion and neck supple.     Comments: Normal Muscle Bulk and Muscle Testing Reveals:  Upper Extremities: Full ROM and Muscle Strength 5/5 Lower Extremities: Full ROM and Muscle Strength 5/5 Arises from chair with ease Narrow Based  Gait     Skin:    General: Skin is warm and dry.  Neurological:     Mental Status: She is alert and oriented to person, place, and time.  Psychiatric:        Mood and Affect: Mood normal.        Behavior: Behavior normal.         Assessment & Plan:  Subarachnoid hemorrhage. : S/P Ms. Garbett underwent: on 01/07/2022: Dr 01/09/2022 PROCEDURE: Diagnostic cerebral angiogram, Coil embolization of Anterior communicating artery aneurysm and left posterior communicating artery aneurysm.Dr Conchita Paris Following.  Referral for outpatient Therapy.  2. Ms. Heinzelman was encouraged to obtain a Primary care Physician , she verbalizes understanding.   F/U with Dr Elane Fritz in 4- 6 weeks

## 2022-02-19 NOTE — Therapy (Signed)
OUTPATIENT SPEECH LANGUAGE PATHOLOGY EVALUATION   Patient Name: Judy Walsh MRN: [id number] DOB:1975-12-09, 46 y.o., female Today's Date: 02/21/2022  PCP: none on file REFERRING PROVIDER: Jones Bales, NP   End of Session - 02/21/22 1135     Visit Number 1    Number of Visits 17    Date for SLP Re-Evaluation 04/18/22    Authorization Type [employer]    SLP Start Time 1100    SLP Stop Time  1148    SLP Time Calculation (min) 48 min    Activity Tolerance Patient tolerated treatment well             Past Medical History:  Diagnosis Date   Chest x-ray abnormality 06/09/2021   CXR 11/22: Vague nodular opacities in the mid left lung and right upper lobe which may be due to overlapping structures. Comparison with older imaging studies or follow-up is recommended to exclude pulmonary nodule.    Myopericarditis 06/08/2021   admx 11/22 >> hsTrop mildly elevated w/o trend; BNP and CRP high - trending down at DC // Echocardiogram 11/22: no effusion, EF 55-60, no RWMA, mild LVH >> NSAIDs/Colchicine   Past Surgical History:  Procedure Laterality Date   IR ANGIO INTRA EXTRACRAN SEL INTERNAL CAROTID BILAT MOD SED  01/07/2022   IR ANGIO VERTEBRAL SEL VERTEBRAL UNI R MOD SED  01/08/2022   IR ANGIOGRAM FOLLOW UP STUDY  01/07/2022   IR ANGIOGRAM FOLLOW UP STUDY  01/07/2022   IR ANGIOGRAM FOLLOW UP STUDY  01/07/2022   IR ANGIOGRAM FOLLOW UP STUDY  01/07/2022   IR NEURO EACH ADD'L AFTER BASIC UNI LEFT (MS)  01/08/2022   IR NEURO EACH ADD'L AFTER BASIC UNI RIGHT (MS)  01/08/2022   IR TRANSCATH/EMBOLIZ  01/07/2022   RADIOLOGY WITH ANESTHESIA N/A 01/07/2022   Procedure: IR WITH ANESTHESIA;  Surgeon: Lisbeth Renshaw, MD;  Location: [facility] OR;  Service: Radiology;  Laterality: N/A;   Patient Active Problem List   Diagnosis Date Noted   Subarachnoid hemorrhage (HCC) 01/21/2022   Cephalgia 01/14/2022   Acute delirium 01/13/2022   SAH (subarachnoid hemorrhage) (HCC) 01/06/2022   Orthostatic  hypotension 06/09/2021   Menorrhagia 06/09/2021   Chest x-ray abnormality 06/09/2021   Myopericarditis 06/08/2021    ONSET DATE: 01-21-22   REFERRING DIAG: I60.9 (ICD-10-CM) - Subarachnoid hemorrhage (HCC)  THERAPY DIAG:  Cognitive communication deficit  Rationale for Evaluation and Treatment Rehabilitation  SUBJECTIVE:   SUBJECTIVE STATEMENT: Reports altered view of self and abilities following stroke, decreased motivation and increased fatigue.  Pt accompanied by: family member daughter, Nelia Shi   PERTINENT HISTORY: Presented 01/06/2022 to ED with severe headache as well as nausea and vomiting.  Cranial CT scan showed moderate volume of acute subarachnoid hemorrhage along the interhemispheric falx, suprasellar cistern, sylvian fissures and basal cisterns.  No evidence of midline shift.Pt admitted to IP rehab. 01-21-22 to 01-27-22. Independent prior to admission working full-time.    PAIN:  Are you having pain? Yes: NPRS scale: 2/10 Pain location: headache  FALLS: Has patient fallen in last 6 months?  No  LIVING ENVIRONMENT: Lives with: lives with their daughter Lives in: House/apartment  PLOF:  Level of assistance: Independent with IADLs Employment: Full-time employment currently using short term disability   PATIENT GOALS "get as close to normal as I can"   OBJECTIVE:   DIAGNOSTIC FINDINGS: 01/14/22 CT HEAD WO CONTRAST: IMPRESSION: Increased lateral ventricular volume but no ventriculomegaly. Resolved high-density blood clot.  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 Is unable to work at this time. Reporting short term memory deficits, difficulty processing and assimilating complex information or multiple pieces of incoming auditory information.   COGNITIVE COMMUNICATION Following directions: Follows multi-step commands consistently  Auditory comprehension: WFL Verbal expression: Impaired: "I have thoughts in my head and I can't get them out" Mazing and circumlocution observed during today's session but overall manages to successfully relay thoughts and ideas.  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   ORAL MOTOR EXAMINATION Overall status: Did not assess  STANDARDIZED ASSESSMENTS: To be administered first therapy session  PATIENT REPORTED OUTCOME MEASURES (PROM): Ability to participate in Social Roles:  100 Notable for having to work/engage in activities for reduced time, difficulty meeting needs of family or completing household responsibilities, reduced or negatively impacted participation in work and leisure activities.   TODAY'S TREATMENT:  SLP provides pt with education regarding recovery following ABI and SLP role in facilitating return to desired activities through use of strategies/ compensations and targeted   PATIENT EDUCATION: Education details: see above Person educated: Patient and Child(ren) Education method: Explanation, Demonstration, and Handouts Education comprehension: verbalized understanding, returned demonstration, and needs further education  GOALS: Goals reviewed with patient? Yes  SHORT TERM GOALS: Target date: 03/20/22  Pt will utilize external aid to prioritize, schedule, and complete x1 novel daily tasks over 1 week period.  Baseline: Goal status: INITIAL  2.  Pt will teach back attention strategies and compensations to aid in completion of desired home  tasks with rare min-A.  Baseline:  Goal status: INITIAL  3.  Pt will utilize self-selected internal memory compensation to aid in short term memory during structured therapy task with 80% accuracy, given rare min-A.  Baseline:  Goal status: INITIAL  4.  Pt will teach back x3 cognitive fatigue management principles/strategies to facilitate return to engagement in desired activities with rare min-A.  Baseline:  Goal status: INITIAL  5.  Pt will identify appropriate external memory aid to facilitate improved successful completion of task given personally relevant, hypothetical situation in 4/5 opportunities.  Baseline:  Goal status: INITIAL  6.  Pt will complete standardized cognitive assessment within 1-2 therapy sessions.  Baseline:  Goal status: INITIAL  LONG TERM GOALS: Target date: 04/18/22  Pt will report improved ability to participate in social roles by 5 points via PROM by d/c.  Baseline:  Goal status: INITIAL  2.  Pt will utilize trained strategies and compensations (fatigue management, external aids, etc.) to complete x3 self-selected daily tasks over 1 week period with mod-I.  Baseline:  Goal status: INITIAL  3.  Through use of all targeted strategies, and adherence to HEP, pt will report subjective improvement in motivation by 20% by d/c.  Baseline: Currently rates motivation as 4/10 (1- not getting out of bed, 10-ideal motivation) Goal status: INITIAL  4.  Pt will report carryover of attention strategies and compensation in x3 functional scenarios at home, resulting in improved performance of daily tasks, with mod-I over 1 week period.  Baseline:  Goal status: INITIAL  5.  Pt will demonstrate use of trained strategies to  discuss hypothetical home and buying process (emulating realtor duties as buyers agent) in 15 minute conversation with self-rating of 3/5 for satisfaction of performance given rare min-A.  Baseline:  Goal status: INITIAL  ASSESSMENT:  CLINICAL  IMPRESSION: Patient is a 46 y.o. 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 Since ABI, pt presenting with mild-moderate impairment in short term, working, and prospective memory, attention, executive functioning, and verbal expression 2/2 cognitive deficits. 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 Pt's cognitive impairments are resulting in reduced ability to participate in tasks required to manage home without oversight from teenaged daughter, work, or engage in social activities. Reduced attention limits time able to spend engaged in activities, accurately complete simple tasks (e.g. not putting cookies in oven), and recall verbally presented information. Verbal expression characterized by mild mazing and pausing as pt attempts to organize thoughts and express them coherently. I recommend skilled ST to optimize pt's independence and reduce caregiver burden.   OBJECTIVE IMPAIRMENTS include attention, memory, executive functioning, and expressive language. These impairments are limiting patient from return to work, managing appointments, household responsibilities, ADLs/IADLs, and effectively communicating at home and in community. Factors affecting potential to achieve goals and functional outcome are  n/a . Patient will benefit from skilled SLP services to address above impairments and improve overall function.  REHAB POTENTIAL: Good  PLAN: SLP FREQUENCY: 2x/week  SLP DURATION: 8 weeks  PLANNED INTERVENTIONS: Language facilitation, Cueing hierachy, Cognitive reorganization, Internal/external aids, Functional tasks, SLP instruction and feedback, Compensatory strategies, and Patient/family education    Su Monks, CCC-SLP 02/21/2022, 11:36 AM

## 2022-02-20 ENCOUNTER — Encounter: Payer: Self-pay | Admitting: Speech Pathology

## 2022-02-20 ENCOUNTER — Ambulatory Visit: Payer: 59 | Attending: Registered Nurse | Admitting: Speech Pathology

## 2022-02-20 DIAGNOSIS — R41841 Cognitive communication deficit: Secondary | ICD-10-CM | POA: Insufficient documentation

## 2022-02-20 NOTE — Patient Instructions (Signed)
Checkers Chief [employer] Officer 4 [employer] games Jig saw puzzles Easy cross words Memory match Board games [employer] Learn a new game!  Listen to and discuss Ted Talks or Podcasts Read and discuss short articles of interest to you- Take notes on these if memory is a challenge Discuss social media posts Look and discuss photo albums  The best activities to improve cognition are functional, real life activities that are important to you:  Plan a menu Participate in household chores and decisions (with supervision) Participate in managing finances Plan a party, trip or tailgate with all of the details (even if you aren't really going to carry it out) Participate in your hobby as you are able with assistance Manage your texts, emails with supervision if needed. Google search for items (even if you're not really going to buy anything) and compare prices and features Socialize -  however, too many visitors can be overwhelming, so set limits "My doctor said I should only visit (or talk) for 20 minutes" or "I do better when I visit with just 1-2 people at a time for 20 minutes"  It's good to use real in-person games, not just apps  Apps:  [employer]

## 2022-02-23 ENCOUNTER — Other Ambulatory Visit: Payer: Self-pay | Admitting: Registered Nurse

## 2022-02-24 ENCOUNTER — Other Ambulatory Visit: Payer: Self-pay

## 2022-02-25 ENCOUNTER — Telehealth: Payer: Self-pay

## 2022-02-25 MED ORDER — LEVETIRACETAM 500 MG PO TABS: 500.0000 mg | ORAL_TABLET | Freq: Two times a day (BID) | ORAL | 0 refills | Status: DC

## 2022-02-25 MED ORDER — MELATONIN 5 MG PO TABS: 5.0000 mg | ORAL_TABLET | Freq: Every day | ORAL | 0 refills | Status: DC

## 2022-02-25 NOTE — Telephone Encounter (Signed)
Received a refill request for Topiramate 25 mg. Called pharmacy and left message that a prescription was sent over Receipt confirmed by pharmacy (02/12/2022 11:41 AM EDT

## 2022-02-25 NOTE — Therapy (Unsigned)
OUTPATIENT SPEECH LANGUAGE PATHOLOGY TREATMENT   Patient Name: Judy Walsh MRN: [id number] DOB:11/16/1975, 46 y.o., female Today's Date: 02/26/2022  PCP: none on file REFERRING PROVIDER: Jones Bales, NP   End of Session - 02/26/22 1015     Visit Number 2    Number of Visits 17    Date for SLP Re-Evaluation 04/18/22    Authorization Type [employer]    SLP Start Time 1015    SLP Stop Time  1058    SLP Time Calculation (min) 43 min    Activity Tolerance Patient tolerated treatment well              Past Medical History:  Diagnosis Date   Chest x-ray abnormality 06/09/2021   CXR 11/22: Vague nodular opacities in the mid left lung and right upper lobe which may be due to overlapping structures. Comparison with older imaging studies or follow-up is recommended to exclude pulmonary nodule.    Myopericarditis 06/08/2021   admx 11/22 >> hsTrop mildly elevated w/o trend; BNP and CRP high - trending down at DC // Echocardiogram 11/22: no effusion, EF 55-60, no RWMA, mild LVH >> NSAIDs/Colchicine   Past Surgical History:  Procedure Laterality Date   IR ANGIO INTRA EXTRACRAN SEL INTERNAL CAROTID BILAT MOD SED  01/07/2022   IR ANGIO VERTEBRAL SEL VERTEBRAL UNI R MOD SED  01/08/2022   IR ANGIOGRAM FOLLOW UP STUDY  01/07/2022   IR ANGIOGRAM FOLLOW UP STUDY  01/07/2022   IR ANGIOGRAM FOLLOW UP STUDY  01/07/2022   IR ANGIOGRAM FOLLOW UP STUDY  01/07/2022   IR NEURO EACH ADD'L AFTER BASIC UNI LEFT (MS)  01/08/2022   IR NEURO EACH ADD'L AFTER BASIC UNI RIGHT (MS)  01/08/2022   IR TRANSCATH/EMBOLIZ  01/07/2022   RADIOLOGY WITH ANESTHESIA N/A 01/07/2022   Procedure: IR WITH ANESTHESIA;  Surgeon: Lisbeth Renshaw, MD;  Location: [facility] OR;  Service: Radiology;  Laterality: N/A;   Patient Active Problem List   Diagnosis Date Noted   Subarachnoid hemorrhage (HCC) 01/21/2022   Cephalgia 01/14/2022   Acute delirium 01/13/2022   SAH (subarachnoid hemorrhage) (HCC) 01/06/2022   Orthostatic  hypotension 06/09/2021   Menorrhagia 06/09/2021   Chest x-ray abnormality 06/09/2021   Myopericarditis 06/08/2021    ONSET DATE: 01-21-22   REFERRING DIAG: I60.9 (ICD-10-CM) - Subarachnoid hemorrhage (HCC)  THERAPY DIAG:  Cognitive communication deficit  Rationale for Evaluation and Treatment Rehabilitation  SUBJECTIVE:   SUBJECTIVE STATEMENT: "I read my book!"    PAIN:  Are you having pain? Yes: NPRS scale: 2/10 Pain location: headache   OBJECTIVE:   STANDARDIZED ASSESSMENTS: To be administered first therapy session  TODAY'S TREATMENT:  02-26-22: Zoriah reports she has successfully read 40+ pages of a book. Slight visual issues reported. SLP provides eduction on neuro-ophthalmologist as potential professional who can ID and treat vision issues post brain event. Pt reports difficulty recalling to take evening medications and other evening wrap up tasks such as blowing out candles. Given min-A, pt able to generate strategy of creating nightly to-do list to aid in successful completion of required tasks. Generates x7 items to add to list which are important for pt to complete. SLP provides refinement to strategy, suggesting that pt place list on bathroom mirror. SLP challenges pt to attempt implementation of nightly routine as internal memory aid with external aid of to-do list as supporting support. Pt to check list when brushing teeth and assess completion of desired tasks. Pt agreeable to begin daily journaling as strategy  to aid in recall of daily tasks and support initiation of tasks following day. Provided pt with journal prompt.   PATIENT EDUCATION: Education details: see above Person educated: Patient and Child(ren) Education method: Explanation, Demonstration, and Handouts Education comprehension: verbalized understanding, returned demonstration, and needs further education  GOALS: Goals reviewed with patient? Yes  SHORT TERM GOALS: Target date: 03/20/22  Pt will utilize  external aid to prioritize, schedule, and complete x1 novel daily tasks over 1 week period.  Baseline: Goal status: IN PROGRESS  2.  Pt will teach back attention strategies and compensations to aid in completion of desired home tasks with rare min-A.  Baseline:  Goal status: IN PROGRESS  3.  Pt will utilize self-selected internal memory compensation to aid in short term memory during structured therapy task with 80% accuracy, given rare min-A.  Baseline:  Goal status: IN PROGRESS  4.  Pt will teach back x3 cognitive fatigue management principles/strategies to facilitate return to engagement in desired activities with rare min-A.  Baseline:  Goal status: IN PROGRESS  5.  Pt will identify appropriate external memory aid to facilitate improved successful completion of task given personally relevant, hypothetical situation in 4/5 opportunities.  Baseline:  Goal status: IN PROGRESS  6.  Pt will complete standardized cognitive assessment within 1-2 therapy sessions.  Baseline:  Goal status: IN PROGRESS  LONG TERM GOALS: Target date: 04/18/22  Pt will report improved ability to participate in social roles by 5 points via PROM by d/c.  Baseline:  Goal status: IN PROGRESS  2.  Pt will utilize trained strategies and compensations (fatigue management, external aids, etc.) to complete x3 self-selected daily tasks over 1 week period with mod-I.  Baseline:  Goal status: IN PROGRESS  3.  Through use of all targeted strategies, and adherence to HEP, pt will report subjective improvement in motivation by 20% by d/c.  Baseline: Currently rates motivation as 4/10 (1- not getting out of bed, 10-ideal motivation) Goal status: IN PROGRESS  4.  Pt will report carryover of attention strategies and compensation in x3 functional scenarios at home, resulting in improved performance of daily tasks, with mod-I over 1 week period.  Baseline:  Goal status: IN PROGRESS  5.  Pt will demonstrate use of  trained strategies to discuss hypothetical home and buying process (emulating realtor duties as buyers agent) in 15 minute conversation with self-rating of 3/5 for satisfaction of performance given rare min-A.  Baseline:  Goal status: IN PROGRESS  ASSESSMENT:  CLINICAL IMPRESSION: Patient is a 46 y.o. 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 Since ABI, pt presenting with mild-moderate impairment in short term, working, and prospective memory, attention, executive functioning, and verbal expression 2/2 cognitive deficits. 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 Pt's cognitive impairments are resulting in reduced ability to participate in tasks required to manage home without oversight from teenaged daughter, work, or engage in social activities. Reduced attention limits time able to spend engaged in activities, accurately complete simple tasks (e.g. not putting cookies in oven), and recall verbally presented information. Verbal expression characterized by mild mazing and pausing as pt attempts to organize thoughts and express them coherently. I recommend skilled ST to optimize pt's independence and reduce caregiver burden.   OBJECTIVE IMPAIRMENTS include attention, memory, executive functioning, and expressive language. These  impairments are limiting patient from return to work, managing appointments, household responsibilities, ADLs/IADLs, and effectively communicating at home and in community. Factors affecting potential to achieve goals and functional outcome are  n/a . Patient will benefit from skilled SLP services to address above impairments and improve overall function.  REHAB POTENTIAL: Good  PLAN: SLP FREQUENCY: 2x/week  SLP DURATION: 8 weeks  PLANNED  INTERVENTIONS: Language facilitation, Cueing hierachy, Cognitive reorganization, Internal/external aids, Functional tasks, SLP instruction and feedback, Compensatory strategies, and Patient/family education    Su Monks, CCC-SLP 02/26/2022, 10:16 AM

## 2022-02-25 NOTE — Telephone Encounter (Signed)
Refill request for Folic Acid.

## 2022-02-26 ENCOUNTER — Ambulatory Visit: Payer: 59 | Attending: Registered Nurse | Admitting: Speech Pathology

## 2022-02-26 DIAGNOSIS — R41841 Cognitive communication deficit: Secondary | ICD-10-CM | POA: Insufficient documentation

## 2022-02-26 NOTE — Patient Instructions (Signed)
Neuro ophthalmologist - for vision issues s/p brain event   End of Evening List Lock front door Appliances are off Candles blown out Plugged in cell phone  Check medication box  Lock car doors   Daily Journal:  What are two things I accomplished today:    What is something I want to accomplish tomorrow:

## 2022-02-28 ENCOUNTER — Ambulatory Visit: Payer: 59 | Admitting: Speech Pathology

## 2022-02-28 DIAGNOSIS — R41841 Cognitive communication deficit: Secondary | ICD-10-CM

## 2022-02-28 NOTE — Therapy (Signed)
OUTPATIENT SPEECH LANGUAGE PATHOLOGY TREATMENT   Patient Name: Judy Walsh MRN: [id number] DOB:May 30, 1976, 46 y.o., female Today's Date: 02/28/2022  PCP: none on file REFERRING PROVIDER: Jones Bales, NP   End of Session - 02/28/22 1308     Visit Number 3    Number of Visits 17    Date for SLP Re-Evaluation 04/18/22    Authorization Type [employer]et[employer]    SLP Start Time 1308    SLP Stop Time  1353    SLP Time Calculation (min) 45 min    Activity Tolerance Patient tolerated treatment well               Past Medical History:  Diagnosis Date   Chest x-ray abnormality 06/09/2021   CXR 11/22: Vague nodular opacities in the mid left lung and right upper lobe which may be due to overlapping structures. Comparison with older imaging studies or follow-up is recommended to exclude pulmonary nodule.    Myopericarditis 06/08/2021   admx 11/22 >> hsTrop mildly elevated w/o trend; BNP and CRP high - trending down at DC // Echocardiogram 11/22: no effusion, EF 55-60, no RWMA, mild LVH >> NSAIDs/Colchicine   Past Surgical History:  Procedure Laterality Date   IR ANGIO INTRA EXTRACRAN SEL INTERNAL CAROTID BILAT MOD SED  01/07/2022   IR ANGIO VERTEBRAL SEL VERTEBRAL UNI R MOD SED  01/08/2022   IR ANGIOGRAM FOLLOW UP STUDY  01/07/2022   IR ANGIOGRAM FOLLOW UP STUDY  01/07/2022   IR ANGIOGRAM FOLLOW UP STUDY  01/07/2022   IR ANGIOGRAM FOLLOW UP STUDY  01/07/2022   IR NEURO EACH ADD'L AFTER BASIC UNI LEFT (MS)  01/08/2022   IR NEURO EACH ADD'L AFTER BASIC UNI RIGHT (MS)  01/08/2022   IR TRANSCATH/EMBOLIZ  01/07/2022   RADIOLOGY WITH ANESTHESIA N/A 01/07/2022   Procedure: IR WITH ANESTHESIA;  Surgeon: Lisbeth Renshaw, MD;  Location: [facility] OR;  Service: Radiology;  Laterality: N/A;   Patient Active Problem List   Diagnosis Date Noted   Subarachnoid hemorrhage (HCC) 01/21/2022   Cephalgia 01/14/2022   Acute delirium 01/13/2022   SAH (subarachnoid hemorrhage) (HCC) 01/06/2022   Orthostatic  hypotension 06/09/2021   Menorrhagia 06/09/2021   Chest x-ray abnormality 06/09/2021   Myopericarditis 06/08/2021    ONSET DATE: 01-21-22   REFERRING DIAG: I60.9 (ICD-10-CM) - Subarachnoid hemorrhage (HCC)  THERAPY DIAG:  Cognitive communication deficit  Rationale for Evaluation and Treatment Rehabilitation  SUBJECTIVE:   SUBJECTIVE STATEMENT: "I had a later night last night"  PAIN:  Are you having pain? Yes: NPRS scale: 2/10 Pain location: headache   OBJECTIVE:   STANDARDIZED ASSESSMENTS: To be administered first therapy session  TODAY'S TREATMENT:  02-28-22: Pt reports carryover of creating daily to-do list with success initiation of daily tasks over previous x2 days. Pt verbalizes that creating plan for following day is assisting her in increased motivation and has her thinking more so about her plans for the future. SLP provided education on energy conservation strategies and principles of cognitive fatigue management. Using budgeting framework. Kymari able to list all activities participated in yesterday and assign relative value to tasks. Able to teach back pacing and budgeting strategies and their role in facilitating completion of priority tasks with rare min-A.   02-26-22: Linette reports she has successfully read 40+ pages of a book. Slight visual issues reported. SLP provides eduction on neuro-ophthalmologist as potential professional who can ID and treat vision issues post brain event. Pt reports difficulty recalling to take evening medications and  other evening wrap up tasks such as blowing out candles. Given min-A, pt able to generate strategy of creating nightly to-do list to aid in successful completion of required tasks. Generates x7 items to add to list which are important for pt to complete. SLP provides refinement to strategy, suggesting that pt place list on bathroom mirror. SLP challenges pt to attempt implementation of nightly routine as internal memory aid with  external aid of to-do list as supporting support. Pt to check list when brushing teeth and assess completion of desired tasks. Pt agreeable to begin daily journaling as strategy to aid in recall of daily tasks and support initiation of tasks following day. Provided pt with journal prompt.   PATIENT EDUCATION: Education details: see above Person educated: Patient and Child(ren) Education method: Explanation, Demonstration, and Handouts Education comprehension: verbalized understanding, returned demonstration, and needs further education  GOALS: Goals reviewed with patient? Yes  SHORT TERM GOALS: Target date: 03/20/22  Pt will utilize external aid to prioritize, schedule, and complete x1 novel daily tasks over 1 week period.  Baseline: 02/28/22 Goal status: IN PROGRESS  2.  Pt will teach back attention strategies and compensations to aid in completion of desired home tasks with rare min-A.  Baseline:  Goal status: IN PROGRESS  3.  Pt will utilize self-selected internal memory compensation to aid in short term memory during structured therapy task with 80% accuracy, given rare min-A.  Baseline:  Goal status: IN PROGRESS  4.  Pt will teach back x3 cognitive fatigue management principles/strategies to facilitate return to engagement in desired activities with rare min-A.  Baseline:  Goal status: IN PROGRESS  5.  Pt will identify appropriate external memory aid to facilitate improved successful completion of task given personally relevant, hypothetical situation in 4/5 opportunities.  Baseline:  Goal status: IN PROGRESS  6.  Pt will complete standardized cognitive assessment within 1-2 therapy sessions.  Baseline:  Goal status: IN PROGRESS  LONG TERM GOALS: Target date: 04/18/22  Pt will report improved ability to participate in social roles by 5 points via PROM by d/c.  Baseline:  Goal status: IN PROGRESS  2.  Pt will utilize trained strategies and compensations (fatigue management,  external aids, etc.) to complete x3 self-selected daily tasks over 1 week period with mod-I.  Baseline:  Goal status: IN PROGRESS  3.  Through use of all targeted strategies, and adherence to HEP, pt will report subjective improvement in motivation by 20% by d/c.  Baseline: Currently rates motivation as 4/10 (1- not getting out of bed, 10-ideal motivation) Goal status: IN PROGRESS  4.  Pt will report carryover of attention strategies and compensation in x3 functional scenarios at home, resulting in improved performance of daily tasks, with mod-I over 1 week period.  Baseline:  Goal status: IN PROGRESS  5.  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  Baseline:  Goal status: IN PROGRESS  ASSESSMENT:  CLINICAL IMPRESSION: Patient is a 46 y.o. 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 Since ABI, pt presenting with mild-moderate impairment in short term, working, and prospective memory, attention, executive functioning, and verbal expression 2/2 cognitive deficits. 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 Pt's cognitive impairments are resulting in reduced ability to participate in tasks required to manage home without oversight from teenaged daughter, work, or engage in social activities. Reduced attention limits time able to spend engaged in activities, accurately complete simple tasks (e.g. not putting cookies in oven), and recall verbally presented information. Verbal expression characterized by mild mazing and pausing as pt attempts to organize thoughts and express them  coherently. I recommend skilled ST to optimize pt's independence and reduce caregiver burden.   OBJECTIVE IMPAIRMENTS include attention, memory, executive functioning, and expressive language. These impairments are limiting patient from return to work, managing appointments, household responsibilities, ADLs/IADLs, and effectively communicating at home and in community. Factors affecting potential to achieve goals and functional outcome are  n/a . Patient will benefit from skilled SLP services to address above impairments and improve overall function.  REHAB POTENTIAL: Good  PLAN: SLP FREQUENCY: 2x/week  SLP DURATION: 8 weeks  PLANNED INTERVENTIONS: Language facilitation, Cueing hierachy, Cognitive reorganization, Internal/external aids, Functional tasks, SLP instruction and feedback, Compensatory strategies, and Patient/family education    Maia Breslow, CCC-SLP 02/28/2022, 1:09 PM

## 2022-03-05 ENCOUNTER — Encounter: Payer: Self-pay | Admitting: Speech Pathology

## 2022-03-05 ENCOUNTER — Ambulatory Visit: Payer: 59 | Admitting: Speech Pathology

## 2022-03-05 DIAGNOSIS — R41841 Cognitive communication deficit: Secondary | ICD-10-CM | POA: Diagnosis not present

## 2022-03-05 NOTE — Therapy (Signed)
OUTPATIENT SPEECH LANGUAGE PATHOLOGY TREATMENT   Patient Name: Judy Walsh MRN: [id number] DOB:1976/05/10, 46 y.o., female Today's Date: 03/05/2022  PCP: none on file REFERRING PROVIDER: Jones Bales, NP   End of Session - 03/05/22 1022     Visit Number 4    Number of Visits 17    Date for SLP Re-Evaluation 04/18/22    Authorization Type [employer]    SLP Start Time 1015    SLP Stop Time  1100    SLP Time Calculation (min) 45 min    Activity Tolerance Patient tolerated treatment well               Past Medical History:  Diagnosis Date   Chest x-ray abnormality 06/09/2021   CXR 11/22: Vague nodular opacities in the mid left lung and right upper lobe which may be due to overlapping structures. Comparison with older imaging studies or follow-up is recommended to exclude pulmonary nodule.    Myopericarditis 06/08/2021   admx 11/22 >> hsTrop mildly elevated w/o trend; BNP and CRP high - trending down at DC // Echocardiogram 11/22: no effusion, EF 55-60, no RWMA, mild LVH >> NSAIDs/Colchicine   Past Surgical History:  Procedure Laterality Date   IR ANGIO INTRA EXTRACRAN SEL INTERNAL CAROTID BILAT MOD SED  01/07/2022   IR ANGIO VERTEBRAL SEL VERTEBRAL UNI R MOD SED  01/08/2022   IR ANGIOGRAM FOLLOW UP STUDY  01/07/2022   IR ANGIOGRAM FOLLOW UP STUDY  01/07/2022   IR ANGIOGRAM FOLLOW UP STUDY  01/07/2022   IR ANGIOGRAM FOLLOW UP STUDY  01/07/2022   IR NEURO EACH ADD'L AFTER BASIC UNI LEFT (MS)  01/08/2022   IR NEURO EACH ADD'L AFTER BASIC UNI RIGHT (MS)  01/08/2022   IR TRANSCATH/EMBOLIZ  01/07/2022   RADIOLOGY WITH ANESTHESIA N/A 01/07/2022   Procedure: IR WITH ANESTHESIA;  Surgeon: Lisbeth Renshaw, MD;  Location: [facility] OR;  Service: Radiology;  Laterality: N/A;   Patient Active Problem List   Diagnosis Date Noted   Subarachnoid hemorrhage (HCC) 01/21/2022   Cephalgia 01/14/2022   Acute delirium 01/13/2022   SAH (subarachnoid hemorrhage) (HCC) 01/06/2022   Orthostatic  hypotension 06/09/2021   Menorrhagia 06/09/2021   Chest x-ray abnormality 06/09/2021   Myopericarditis 06/08/2021    ONSET DATE: 01-21-22   REFERRING DIAG: I60.9 (ICD-10-CM) - Subarachnoid hemorrhage (HCC)  THERAPY DIAG:  Cognitive communication deficit  Rationale for Evaluation and Treatment Rehabilitation  SUBJECTIVE:   SUBJECTIVE STATEMENT: "I took a nap before my nieces birthday party"  PAIN:  Are you having pain? Yes: NPRS scale: 2/10 Pain location: headache   OBJECTIVE:   STANDARDIZED ASSESSMENTS: To be administered first therapy session  TODAY'S TREATMENT:              03-05-22: Lyndsee reports she is carrying over energy conservation - resting before a family party, limiting reading to 20 pages. She did push herself then tried to make cookies but forgot to put them in the oven. She continues to use daily to do lists successfully. Trained Cherrish in compensatory strategies for attention and slow processing when talking to her HR department or insurance about FMLA or disability - see pt instructions. Generated 3 strategies to navigate back to school night in a way to maximize her attention and reduce distractions of people approaching her and asking about her health. See pt instructions   02-28-22: Pt reports carryover of creating daily to-do list with success initiation of daily tasks over previous x2 days. Pt verbalizes that  creating plan for following day is assisting her in increased motivation and has her thinking more so about her plans for the future. SLP provided education on energy conservation strategies and principles of cognitive fatigue management. Using budgeting framework. Bradlee able to list all activities participated in yesterday and assign relative value to tasks. Able to teach back pacing and budgeting strategies and their role in facilitating completion of priority tasks with rare min-A.   02-26-22: Henri reports she has successfully read 40+ pages of a book. Slight  visual issues reported. SLP provides eduction on neuro-ophthalmologist as potential professional who can ID and treat vision issues post brain event. Pt reports difficulty recalling to take evening medications and other evening wrap up tasks such as blowing out candles. Given min-A, pt able to generate strategy of creating nightly to-do list to aid in successful completion of required tasks. Generates x7 items to add to list which are important for pt to complete. SLP provides refinement to strategy, suggesting that pt place list on bathroom mirror. SLP challenges pt to attempt implementation of nightly routine as internal memory aid with external aid of to-do list as supporting support. Pt to check list when brushing teeth and assess completion of desired tasks. Pt agreeable to begin daily journaling as strategy to aid in recall of daily tasks and support initiation of tasks following day. Provided pt with journal prompt.   PATIENT EDUCATION: Education details: see above Person educated: Patient and Child(ren) Education method: Explanation, Demonstration, and Handouts Education comprehension: verbalized understanding, returned demonstration, and needs further education  GOALS: Goals reviewed with patient? Yes  SHORT TERM GOALS: Target date: 03/20/22  Pt will utilize external aid to prioritize, schedule, and complete x1 novel daily tasks over 1 week period.  Baseline: 02/28/22 Goal status: IN PROGRESS  2.  Pt will teach back attention strategies and compensations to aid in completion of desired home tasks with rare min-A.  Baseline:  Goal status: IN PROGRESS  3.  Pt will utilize self-selected internal memory compensation to aid in short term memory during structured therapy task with 80% accuracy, given rare min-A.  Baseline:  Goal status: IN PROGRESS  4.  Pt will teach back x3 cognitive fatigue management principles/strategies to facilitate return to engagement in desired activities with rare  min-A.  Baseline:  Goal status: IN PROGRESS  5.  Pt will identify appropriate external memory aid to facilitate improved successful completion of task given personally relevant, hypothetical situation in 4/5 opportunities.  Baseline:  Goal status: IN PROGRESS  6.  Pt will complete standardized cognitive assessment within 1-2 therapy sessions.  Baseline:  Goal status: IN PROGRESS  LONG TERM GOALS: Target date: 04/18/22  Pt will report improved ability to participate in social roles by 5 points via PROM by d/c.  Baseline:  Goal status: IN PROGRESS  2.  Pt will utilize trained strategies and compensations (fatigue management, external aids, etc.) to complete x3 self-selected daily tasks over 1 week period with mod-I.  Baseline:  Goal status: IN PROGRESS  3.  Through use of all targeted strategies, and adherence to HEP, pt will report subjective improvement in motivation by 20% by d/c.  Baseline: Currently rates motivation as 4/10 (1- not getting out of bed, 10-ideal motivation) Goal status: IN PROGRESS  4.  Pt will report carryover of attention strategies and compensation in x3 functional scenarios at home, resulting in improved performance of daily tasks, with mod-I over 1 week period.  Baseline:  Goal status: IN PROGRESS  5.  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  Baseline:  Goal status: IN PROGRESS  ASSESSMENT:  CLINICAL IMPRESSION: Patient is a 46 y.o. 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 Since ABI, pt presenting with mild-moderate impairment in short term, working, and prospective memory, attention, executive functioning, and verbal expression 2/2 cognitive deficits. 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 Pt's cognitive impairments are resulting in reduced ability to participate in tasks required to manage home without oversight from teenaged daughter, work, or engage in social activities. Reduced attention limits time able to spend engaged in activities, accurately complete simple tasks (e.g. not putting cookies in oven), and recall verbally presented information. Verbal expression characterized by mild mazing and pausing as pt attempts to organize thoughts and express them coherently. I recommend skilled ST to optimize pt's independence and reduce caregiver burden.   OBJECTIVE IMPAIRMENTS include attention, memory, executive functioning, and expressive language. These impairments are limiting patient from return to work, managing appointments, household responsibilities, ADLs/IADLs, and effectively communicating at home and in community. Factors affecting potential to achieve goals and functional outcome are  n/a . Patient will benefit from skilled SLP services to address above impairments and improve overall function.  REHAB POTENTIAL: Good  PLAN: SLP FREQUENCY: 2x/week  SLP DURATION: 8 weeks  PLANNED INTERVENTIONS: Language facilitation, Cueing hierachy, Cognitive reorganization, Internal/external aids, Functional tasks, SLP instruction and feedback, Compensatory strategies, and Patient/family education    Alice Reichert Radene Journey, CCC-SLP 03/05/2022, 12:12 PM

## 2022-03-05 NOTE — Patient Instructions (Signed)
   Great job learning to manage your days and energy so you can be successful with your priorities  Right now you have limited pennies to spend each day - you need to decide where you are going to spend them   If you spend too many pennies in 1 day, you are taking them from the next of they day  Good job resting in between each chore or task you do at home   Let the other person know that you have had a stroke and that you need information given to you slower with reduced background -"can we go somewhere quiet"  Take notes, repeat back what you have heard "Let me review what we've talked about...""what I heard you  say is....."  Get information in writing  Have a family member help  you with insurance/disability conversations with your work  Let others know "The doctor said I can only visit for 15 minutes" or however long you want.  Teach you family to advocate for you and get you out of situations that are not comfortable with - have this discussion before hand  Come up with a signal if you need help to get out of a conversation or situation so they can intervene  Back to school night have your daughter help you manage questions and interactions - have a script of what you want to say   Have a script for "Is there anything I can do to help you"  For notes, have a pre-printed card that says thank you for thinking of and for all of your concern and help" then you just have to sign it   Tips to help facilitate better attention, concentration, focus   Do harder, longer tasks when you are most alert/awake  Break down larger tasks into small parts  Limit distractions of TV, radio, conversation, e mails/texts, appliance noise, etc - if a job is important, do it in a quiet room  Be aware of how you are functioning in high stimulation environments such as large stores, parties, restaurants - any place with lots of lights, noise, signs etc  Group conversations may be more difficult  to process than one on one conversations  Give yourself extra time to process conversation, reading materials, directions or information from your healthcare providers  Organization is key - clutters of laundry, mail, paperwork, dirty dishes - all make it more difficult to concentrate  Before you start a task, have all the needed supplies, directions, recipes ready and organized. This way you don't have to go looking for something in the middle of a task and become distracted.   Be aware of fatigue - take rests or breaks when needed to re-group and re-focus

## 2022-03-06 NOTE — Therapy (Signed)
OUTPATIENT SPEECH LANGUAGE PATHOLOGY TREATMENT   Patient Name: Judy Walsh MRN: [id number] DOB:02/10/1976, 46 y.o., female Today's Date: 03/07/2022  PCP: none on file REFERRING PROVIDER: Jones Bales, NP   End of Session - 03/07/22 1229     Visit Number 5    Number of Visits 17    Date for SLP Re-Evaluation 04/18/22    Authorization Type [employer]    SLP Start Time 1229    SLP Stop Time  1314    SLP Time Calculation (min) 45 min    Activity Tolerance Patient tolerated treatment well                Past Medical History:  Diagnosis Date   Chest x-ray abnormality 06/09/2021   CXR 11/22: Vague nodular opacities in the mid left lung and right upper lobe which may be due to overlapping structures. Comparison with older imaging studies or follow-up is recommended to exclude pulmonary nodule.    Myopericarditis 06/08/2021   admx 11/22 >> hsTrop mildly elevated w/o trend; BNP and CRP high - trending down at DC // Echocardiogram 11/22: no effusion, EF 55-60, no RWMA, mild LVH >> NSAIDs/Colchicine   Past Surgical History:  Procedure Laterality Date   IR ANGIO INTRA EXTRACRAN SEL INTERNAL CAROTID BILAT MOD SED  01/07/2022   IR ANGIO VERTEBRAL SEL VERTEBRAL UNI R MOD SED  01/08/2022   IR ANGIOGRAM FOLLOW UP STUDY  01/07/2022   IR ANGIOGRAM FOLLOW UP STUDY  01/07/2022   IR ANGIOGRAM FOLLOW UP STUDY  01/07/2022   IR ANGIOGRAM FOLLOW UP STUDY  01/07/2022   IR NEURO EACH ADD'L AFTER BASIC UNI LEFT (MS)  01/08/2022   IR NEURO EACH ADD'L AFTER BASIC UNI RIGHT (MS)  01/08/2022   IR TRANSCATH/EMBOLIZ  01/07/2022   RADIOLOGY WITH ANESTHESIA N/A 01/07/2022   Procedure: IR WITH ANESTHESIA;  Surgeon: Lisbeth Renshaw, MD;  Location: [facility] OR;  Service: Radiology;  Laterality: N/A;   Patient Active Problem List   Diagnosis Date Noted   Subarachnoid hemorrhage (HCC) 01/21/2022   Cephalgia 01/14/2022   Acute delirium 01/13/2022   SAH (subarachnoid hemorrhage) (HCC) 01/06/2022   Orthostatic  hypotension 06/09/2021   Menorrhagia 06/09/2021   Chest x-ray abnormality 06/09/2021   Myopericarditis 06/08/2021    ONSET DATE: 01-21-22   REFERRING DIAG: I60.9 (ICD-10-CM) - Subarachnoid hemorrhage (HCC)  THERAPY DIAG:  Cognitive communication deficit  Rationale for Evaluation and Treatment Rehabilitation  SUBJECTIVE:   SUBJECTIVE STATEMENT: Reports to improved participation in completing daily tasks.   PAIN:  Are you having pain? No   OBJECTIVE:   TODAY'S TREATMENT:  03-07-22: Pt able to teach back strategies targeted last session with rare min-A. Practiced scripting to avoid distractions during open house, pt able to generate appropriate script with occasional min-A. SLP suggests practice prior to open house to aid in employment of script in pressured situation. Target strategies and compensation to aid in auditory processing and attention during complex conversation upcoming with lawyer. Pt to ask for repetition when needed, repeat back to assess understanding and aid in recall, ask for important information and writing, and take notes if needed. Pt to prepare for meeting by writing down questions prior to meeting. Pt verbalizes understanding and is able to teach back with occasional min-A.   03-05-22: Judy Walsh reports she is carrying over energy conservation - resting before a family party, limiting reading to 20 pages. She did push herself then tried to make cookies but forgot to put them in the oven.  She continues to use daily to do lists successfully. Trained Judy Walsh in compensatory strategies for attention and slow processing when talking to her HR department or insurance about FMLA or disability - see pt instructions. Generated 3 strategies to navigate back to school night in a way to maximize her attention and reduce distractions of people approaching her and asking about her health. See pt instructions   02-28-22: Pt reports carryover of creating daily to-do list with success  initiation of daily tasks over previous x2 days. Pt verbalizes that creating plan for following day is assisting her in increased motivation and has her thinking more so about her plans for the future. SLP provided education on energy conservation strategies and principles of cognitive fatigue management. Using budgeting framework. Judy Walsh able to list all activities participated in yesterday and assign relative value to tasks. Able to teach back pacing and budgeting strategies and their role in facilitating completion of priority tasks with rare min-A.   02-26-22: Judy Walsh reports she has successfully read 40+ pages of a book. Slight visual issues reported. SLP provides eduction on neuro-ophthalmologist as potential professional who can ID and treat vision issues post brain event. Pt reports difficulty recalling to take evening medications and other evening wrap up tasks such as blowing out candles. Given min-A, pt able to generate strategy of creating nightly to-do list to aid in successful completion of required tasks. Generates x7 items to add to list which are important for pt to complete. SLP provides refinement to strategy, suggesting that pt place list on bathroom mirror. SLP challenges pt to attempt implementation of nightly routine as internal memory aid with external aid of to-do list as supporting support. Pt to check list when brushing teeth and assess completion of desired tasks. Pt agreeable to begin daily journaling as strategy to aid in recall of daily tasks and support initiation of tasks following day. Provided pt with journal prompt.   PATIENT EDUCATION: Education details: see above Person educated: Patient and Child(ren) Education method: Explanation, Demonstration, and Handouts Education comprehension: verbalized understanding, returned demonstration, and needs further education  GOALS: Goals reviewed with patient? Yes  SHORT TERM GOALS: Target date: 03/20/22  Pt will utilize external  aid to prioritize, schedule, and complete x1 novel daily tasks over 1 week period.  Baseline: 02/28/22 Goal status: IN PROGRESS  2.  Pt will teach back attention strategies and compensations to aid in completion of desired home tasks with rare min-A.  Baseline:  Goal status: IN PROGRESS  3.  Pt will utilize self-selected internal memory compensation to aid in short term memory during structured therapy task with 80% accuracy, given rare min-A.  Baseline:  Goal status: IN PROGRESS  4.  Pt will teach back x3 cognitive fatigue management principles/strategies to facilitate return to engagement in desired activities with rare min-A.  Baseline:  Goal status: IN PROGRESS  5.  Pt will identify appropriate external memory aid to facilitate improved successful completion of task given personally relevant, hypothetical situation in 4/5 opportunities.  Baseline:  Goal status: IN PROGRESS  6.  Pt will complete standardized cognitive assessment within 1-2 therapy sessions.  Baseline:  Goal status: IN PROGRESS  LONG TERM GOALS: Target date: 04/18/22  Pt will report improved ability to participate in social roles by 5 points via PROM by d/c.  Baseline:  Goal status: IN PROGRESS  2.  Pt will utilize trained strategies and compensations (fatigue management, external aids, etc.) to complete x3 self-selected daily tasks over 1 week period  with mod-I.  Baseline:  Goal status: IN PROGRESS  3.  Through use of all targeted strategies, and adherence to HEP, pt will report subjective improvement in motivation by 20% by d/c.  Baseline: Currently rates motivation as 4/10 (1- not getting out of bed, 10-ideal motivation) Goal status: IN PROGRESS  4.  Pt will report carryover of attention strategies and compensation in x3 functional scenarios at home, resulting in improved performance of daily tasks, with mod-I over 1 week period.  Baseline:  Goal status: IN PROGRESS  5.  Pt will demonstrate use of trained  strategies to discuss hypothetical home and buying process (emulating realtor duties as buyers agent) in 15 minute conversation with self-rating of 3/5 for satisfaction of performance given rare min-A.  Baseline:  Goal status: IN PROGRESS  ASSESSMENT:  CLINICAL IMPRESSION: Patient is a 46 y.o. 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 Since ABI, pt presenting with mild-moderate impairment in short term, working, and prospective memory, attention, executive functioning, and verbal expression 2/2 cognitive deficits. 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 Pt's cognitive impairments are resulting in reduced ability to participate in tasks required to manage home without oversight from teenaged daughter, work, or engage in social activities. Reduced attention limits time able to spend engaged in activities, accurately complete simple tasks (e.g. not putting cookies in oven), and recall verbally presented information. Verbal expression characterized by mild mazing and pausing as pt attempts to organize thoughts and express them coherently. I recommend skilled ST to optimize pt's independence and reduce caregiver burden.   OBJECTIVE IMPAIRMENTS include attention, memory, executive functioning, and expressive language. These impairments are limiting patient from return to work, managing appointments, household responsibilities, ADLs/IADLs, and effectively communicating at home and in community. Factors affecting potential to achieve goals and functional outcome are  n/a . Patient will benefit from skilled SLP services to address above impairments and improve overall function.  REHAB POTENTIAL: Good  PLAN: SLP FREQUENCY: 2x/week  SLP DURATION: 8 weeks  PLANNED  INTERVENTIONS: Language facilitation, Cueing hierachy, Cognitive reorganization, Internal/external aids, Functional tasks, SLP instruction and feedback, Compensatory strategies, and Patient/family education    Maia Breslow, CCC-SLP 03/07/2022, 12:30 PM

## 2022-03-07 ENCOUNTER — Ambulatory Visit: Payer: 59 | Admitting: Speech Pathology

## 2022-03-07 DIAGNOSIS — R41841 Cognitive communication deficit: Secondary | ICD-10-CM

## 2022-03-10 ENCOUNTER — Ambulatory Visit: Payer: 59 | Admitting: Speech Pathology

## 2022-03-12 ENCOUNTER — Ambulatory Visit: Payer: 59 | Admitting: Speech Pathology

## 2022-03-19 ENCOUNTER — Ambulatory Visit: Payer: 59 | Admitting: Speech Pathology

## 2022-03-19 ENCOUNTER — Encounter: Payer: Self-pay | Admitting: Speech Pathology

## 2022-03-19 DIAGNOSIS — R41841 Cognitive communication deficit: Secondary | ICD-10-CM | POA: Diagnosis not present

## 2022-03-19 NOTE — Therapy (Signed)
OUTPATIENT SPEECH LANGUAGE PATHOLOGY TREATMENT   Patient Name: Judy Walsh MRN: [id number] DOB:05/05/1976, 46 y.o., female Today's Date: 03/19/2022  PCP: none on file REFERRING PROVIDER: Bayard Hugger, NP   End of Session - 03/19/22 0936     Visit Number 6    Number of Visits 17    Date for SLP Re-Evaluation 04/18/22    Authorization Type [employer]    SLP Start Time 0932    SLP Stop Time  3888    SLP Time Calculation (min) 43 min    Activity Tolerance Patient tolerated treatment well                Past Medical History:  Diagnosis Date   Chest x-ray abnormality 06/09/2021   CXR 11/22: Vague nodular opacities in the mid left lung and right upper lobe which may be due to overlapping structures. Comparison with older imaging studies or follow-up is recommended to exclude pulmonary nodule.    Myopericarditis 06/08/2021   admx 11/22 >> hsTrop mildly elevated w/o trend; BNP and CRP high - trending down at DC // Echocardiogram 11/22: no effusion, EF 55-60, no RWMA, mild LVH >> NSAIDs/Colchicine   Past Surgical History:  Procedure Laterality Date   IR ANGIO INTRA EXTRACRAN SEL INTERNAL CAROTID BILAT MOD SED  01/07/2022   IR ANGIO VERTEBRAL SEL VERTEBRAL UNI R MOD SED  01/08/2022   IR ANGIOGRAM FOLLOW UP STUDY  01/07/2022   IR ANGIOGRAM FOLLOW UP STUDY  01/07/2022   IR ANGIOGRAM FOLLOW UP STUDY  01/07/2022   IR ANGIOGRAM FOLLOW UP STUDY  01/07/2022   IR NEURO EACH ADD'L AFTER BASIC UNI LEFT (MS)  01/08/2022   IR NEURO EACH ADD'L AFTER BASIC UNI RIGHT (MS)  01/08/2022   IR TRANSCATH/EMBOLIZ  01/07/2022   RADIOLOGY WITH ANESTHESIA N/A 01/07/2022   Procedure: IR WITH ANESTHESIA;  Surgeon: Consuella Lose, MD;  Location: [facility];  Service: Radiology;  Laterality: N/A;   Patient Active Problem List   Diagnosis Date Noted   Subarachnoid hemorrhage ([place]) 01/21/2022   Cephalgia 01/14/2022   Acute delirium 01/13/2022   SAH (subarachnoid hemorrhage) ([place]) 01/06/2022   Orthostatic  hypotension 06/09/2021   Menorrhagia 06/09/2021   Chest x-ray abnormality 06/09/2021   Myopericarditis 06/08/2021    ONSET DATE: 01-21-22   REFERRING DIAG: I60.9 (ICD-10-CM) - Subarachnoid hemorrhage (HCC)  THERAPY DIAG:  Cognitive communication deficit  Rationale for Evaluation and Treatment Rehabilitation  SUBJECTIVE:   SUBJECTIVE STATEMENT: "I went early to meet the teacher and it worked out great"  PAIN:  Are you having pain? No   OBJECTIVE:   TODAY'S TREATMENT:   03-19-22: She carried over strategy of getting to back to school night early, having a script to greet people and move on and she took her dinner to go - she felt successful handling and processing the night. She carried over strategies for attention with lawyer visit of repeating back what she heard, take notes and have family member with her to make sure she attended to and recall information.She is carrying over use of sticky notes and writing down appointments to aid memory. She is working on her book successfully by taking breaks. Today we targeted work accommodations and compensations for attention, memory and processing in the work place. We generated 7 compensatory strategies for eventual return to work - see pt instructions   03-07-22: Pt able to teach back strategies targeted last session with rare min-A. Practiced scripting to avoid distractions during open house, pt able  to generate appropriate script with occasional min-A. SLP suggests practice prior to open house to aid in employment of script in pressured situation. Target strategies and compensation to aid in auditory processing and attention during complex conversation upcoming with lawyer. Pt to ask for repetition when needed, repeat back to assess understanding and aid in recall, ask for important information and writing, and take notes if needed. Pt to prepare for meeting by writing down questions prior to meeting. Pt verbalizes understanding and is able  to teach back with occasional min-A.   03-05-22: Judy Walsh reports she is carrying over energy conservation - resting before a family party, limiting reading to 20 pages. She did push herself then tried to make cookies but forgot to put them in the oven. She continues to use daily to do lists successfully. Trained Judy Walsh in compensatory strategies for attention and slow processing when talking to her HR department or insurance about FMLA or disability - see pt instructions. Generated 3 strategies to navigate back to school night in a way to maximize her attention and reduce distractions of people approaching her and asking about her health. See pt instructions   02-28-22: Pt reports carryover of creating daily to-do list with success initiation of daily tasks over previous x2 days. Pt verbalizes that creating plan for following day is assisting her in increased motivation and has her thinking more so about her plans for the future. SLP provided education on energy conservation strategies and principles of cognitive fatigue management. Using budgeting framework. Judy Walsh able to list all activities participated in yesterday and assign relative value to tasks. Able to teach back pacing and budgeting strategies and their role in facilitating completion of priority tasks with rare min-A.   02-26-22: Judy Walsh reports she has successfully read 40+ pages of a book. Slight visual issues reported. SLP provides eduction on neuro-ophthalmologist as potential professional who can ID and treat vision issues post brain event. Pt reports difficulty recalling to take evening medications and other evening wrap up tasks such as blowing out candles. Given min-A, pt able to generate strategy of creating nightly to-do list to aid in successful completion of required tasks. Generates x7 items to add to list which are important for pt to complete. SLP provides refinement to strategy, suggesting that pt place list on bathroom mirror. SLP challenges  pt to attempt implementation of nightly routine as internal memory aid with external aid of to-do list as supporting support. Pt to check list when brushing teeth and assess completion of desired tasks. Pt agreeable to begin daily journaling as strategy to aid in recall of daily tasks and support initiation of tasks following day. Provided pt with journal prompt.   PATIENT EDUCATION: Education details: see above Person educated: Patient and Child(ren) Education method: Explanation, Demonstration, and Handouts Education comprehension: verbalized understanding, returned demonstration, and needs further education  GOALS: Goals reviewed with patient? Yes  SHORT TERM GOALS: Target date: 03/20/22  Pt will utilize external aid to prioritize, schedule, and complete x1 novel daily tasks over 1 week period.  Baseline: 02/28/22 Goal status: MET  2.  Pt will teach back attention strategies and compensations to aid in completion of desired home tasks with rare min-A.  Baseline:  Goal status: MET  3.  Pt will utilize self-selected internal memory compensation to aid in short term memory during structured therapy task with 80% accuracy, given rare min-A.  Baseline:  Goal status: IN PROGRESS  4.  Pt will teach back x3 cognitive fatigue management  principles/strategies to facilitate return to engagement in desired activities with rare min-A.  Baseline:  Goal status: MET  5.  Pt will identify appropriate external memory aid to facilitate improved successful completion of task given personally relevant, hypothetical situation in 4/5 opportunities.  Baseline:  Goal status: MET  6.  Pt will complete standardized cognitive assessment within 1-2 therapy sessions.  Baseline:  Goal status: IN PROGRESS  LONG TERM GOALS: Target date: 04/18/22  Pt will report improved ability to participate in social roles by 5 points via PROM by d/c.  Baseline:  Goal status: IN PROGRESS  2.  Pt will utilize trained  strategies and compensations (fatigue management, external aids, etc.) to complete x3 self-selected daily tasks over 1 week period with mod-I.  Baseline:  Goal status: IN PROGRESS  3.  Through use of all targeted strategies, and adherence to HEP, pt will report subjective improvement in motivation by 20% by d/c.  Baseline: Currently rates motivation as 4/10 (1- not getting out of bed, 10-ideal motivation) Goal status: IN PROGRESS  4.  Pt will report carryover of attention strategies and compensation in x3 functional scenarios at home, resulting in improved performance of daily tasks, with mod-I over 1 week period.  Baseline:  Goal status: IN PROGRESS  5.  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  Baseline:  Goal status: IN PROGRESS  ASSESSMENT:  CLINICAL IMPRESSION: Patient is a 46 y.o. 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 Since ABI, pt presenting with mild-moderate impairment in short term, working, and prospective memory, attention, executive functioning, and verbal expression 2/2 cognitive deficits. Judy Walsh has successful carried over compensatory strategies for attention, processing, memory and thought organization to return to IADLs, writing her book, managing appointments and finances. She reports improved motivation, initiation. At this time, continue training compensations for attention with regards to possible return to work in the future. I recommend skilled ST to optimize pt's independence and reduce caregiver burden.   OBJECTIVE IMPAIRMENTS include attention, memory, executive functioning, and expressive language. These impairments are limiting patient from return to work, managing appointments, household responsibilities, ADLs/IADLs, and  effectively communicating at home and in community. Factors affecting potential to achieve goals and functional outcome are  n/a . Patient will benefit from skilled SLP services to address above impairments and improve overall function.  REHAB POTENTIAL: Good  PLAN: SLP FREQUENCY: 2x/week  SLP DURATION: 8 weeks  PLANNED INTERVENTIONS: Language facilitation, Cueing hierachy, Cognitive reorganization, Internal/external aids, Functional tasks, SLP instruction and feedback, Compensatory strategies, and Patient/family education    Alice Rieger Annye Rusk, CCC-SLP 03/19/2022, 10:21 AM

## 2022-03-19 NOTE — Patient Instructions (Signed)
   Consider requesting work accommodations   When you return go back 4 hours - part time initially  Try to have set times where you check your e mail, voice mail  Find a quiet place to take breaks when needed -even if that is in your car  Know that as you go back to work, you may have less energy in the evening and on the weekends  Don't plan too much after work or on weekends - again set priorities  Let clients know you will respond within 1-2 business days (they should not expect immediate responses)  Get blinds, use a do not disturb sign or post office hours for when you are available   Try to think of anything you did outside of your job description that people might expect you to still do - have a script that you will not be doing that anymore

## 2022-03-21 ENCOUNTER — Ambulatory Visit: Payer: 59 | Admitting: Speech Pathology

## 2022-03-21 NOTE — Therapy (Deleted)
OUTPATIENT SPEECH LANGUAGE PATHOLOGY TREATMENT   Patient Name: Judy Walsh MRN: [id number] DOB:May 29, 1976, 46 y.o., female Today's Date: 03/21/2022  PCP: none on file REFERRING PROVIDER: Bayard Hugger, NP        Past Medical History:  Diagnosis Date   Chest x-ray abnormality 06/09/2021   CXR 11/22: Vague nodular opacities in the mid left lung and right upper lobe which may be due to overlapping structures. Comparison with older imaging studies or follow-up is recommended to exclude pulmonary nodule.    Myopericarditis 06/08/2021   admx 11/22 >> hsTrop mildly elevated w/o trend; BNP and CRP high - trending down at DC // Echocardiogram 11/22: no effusion, EF 55-60, no RWMA, mild LVH >> NSAIDs/Colchicine   Past Surgical History:  Procedure Laterality Date   IR ANGIO INTRA EXTRACRAN SEL INTERNAL CAROTID BILAT MOD SED  01/07/2022   IR ANGIO VERTEBRAL SEL VERTEBRAL UNI R MOD SED  01/08/2022   IR ANGIOGRAM FOLLOW UP STUDY  01/07/2022   IR ANGIOGRAM FOLLOW UP STUDY  01/07/2022   IR ANGIOGRAM FOLLOW UP STUDY  01/07/2022   IR ANGIOGRAM FOLLOW UP STUDY  01/07/2022   IR NEURO EACH ADD'L AFTER BASIC UNI LEFT (MS)  01/08/2022   IR NEURO EACH ADD'L AFTER BASIC UNI RIGHT (MS)  01/08/2022   IR TRANSCATH/EMBOLIZ  01/07/2022   RADIOLOGY WITH ANESTHESIA N/A 01/07/2022   Procedure: IR WITH ANESTHESIA;  Surgeon: Consuella Lose, MD;  Location: [facility];  Service: Radiology;  Laterality: N/A;   Patient Active Problem List   Diagnosis Date Noted   Subarachnoid hemorrhage ([place]) 01/21/2022   Cephalgia 01/14/2022   Acute delirium 01/13/2022   SAH (subarachnoid hemorrhage) ([place]) 01/06/2022   Orthostatic hypotension 06/09/2021   Menorrhagia 06/09/2021   Chest x-ray abnormality 06/09/2021   Myopericarditis 06/08/2021    ONSET DATE: 01-21-22   REFERRING DIAG: I60.9 (ICD-10-CM) - Subarachnoid hemorrhage (HCC)  THERAPY DIAG:  No diagnosis found.  Rationale for Evaluation and Treatment  Rehabilitation  SUBJECTIVE:   SUBJECTIVE STATEMENT: ***  PAIN:  Are you having pain? No   OBJECTIVE:   TODAY'S TREATMENT:   03-21-22: SLP provided education and training on internal memory strategies to aid in short term recall of important information. *** Following instruction, SLP provided pt with information, working up to *** pieces at a time, in which pt able to select appropriate strategy with *** ***-A, and then employ strategy to recall information following *** minute latency period in ***/*** trials. Pt teaches back ***/*** strategies at conclusion of session with *** ***-A.   03-19-22: She carried over strategy of getting to back to school night early, having a script to greet people and move on and she took her dinner to go - she felt successful handling and processing the night. She carried over strategies for attention with lawyer visit of repeating back what she heard, take notes and have family member with her to make sure she attended to and recall information.She is carrying over use of sticky notes and writing down appointments to aid memory. She is working on her book successfully by taking breaks. Today we targeted work accommodations and compensations for attention, memory and processing in the work place. We generated 7 compensatory strategies for eventual return to work - see pt instructions   03-07-22: Pt able to teach back strategies targeted last session with rare min-A. Practiced scripting to avoid distractions during open house, pt able to generate appropriate script with occasional min-A. SLP suggests practice prior to open  house to aid in employment of script in pressured situation. Target strategies and compensation to aid in auditory processing and attention during complex conversation upcoming with lawyer. Pt to ask for repetition when needed, repeat back to assess understanding and aid in recall, ask for important information and writing, and take notes if needed.  Pt to prepare for meeting by writing down questions prior to meeting. Pt verbalizes understanding and is able to teach back with occasional min-A.   03-05-22: Treazure reports she is carrying over energy conservation - resting before a family party, limiting reading to 20 pages. She did push herself then tried to make cookies but forgot to put them in the oven. She continues to use daily to do lists successfully. Trained Deara in compensatory strategies for attention and slow processing when talking to her HR department or insurance about FMLA or disability - see pt instructions. Generated 3 strategies to navigate back to school night in a way to maximize her attention and reduce distractions of people approaching her and asking about her health. See pt instructions   02-28-22: Pt reports carryover of creating daily to-do list with success initiation of daily tasks over previous x2 days. Pt verbalizes that creating plan for following day is assisting her in increased motivation and has her thinking more so about her plans for the future. SLP provided education on energy conservation strategies and principles of cognitive fatigue management. Using budgeting framework. Gwenivere able to list all activities participated in yesterday and assign relative value to tasks. Able to teach back pacing and budgeting strategies and their role in facilitating completion of priority tasks with rare min-A.   02-26-22: Khali reports she has successfully read 40+ pages of a book. Slight visual issues reported. SLP provides eduction on neuro-ophthalmologist as potential professional who can ID and treat vision issues post brain event. Pt reports difficulty recalling to take evening medications and other evening wrap up tasks such as blowing out candles. Given min-A, pt able to generate strategy of creating nightly to-do list to aid in successful completion of required tasks. Generates x7 items to add to list which are important for pt to  complete. SLP provides refinement to strategy, suggesting that pt place list on bathroom mirror. SLP challenges pt to attempt implementation of nightly routine as internal memory aid with external aid of to-do list as supporting support. Pt to check list when brushing teeth and assess completion of desired tasks. Pt agreeable to begin daily journaling as strategy to aid in recall of daily tasks and support initiation of tasks following day. Provided pt with journal prompt.   PATIENT EDUCATION: Education details: see above Person educated: Patient and Child(ren) Education method: Explanation, Demonstration, and Handouts Education comprehension: verbalized understanding, returned demonstration, and needs further education  GOALS: Goals reviewed with patient? Yes  SHORT TERM GOALS: Target date: 03/20/22  Pt will utilize external aid to prioritize, schedule, and complete x1 novel daily tasks over 1 week period.  Baseline: 02/28/22 Goal status: MET  2.  Pt will teach back attention strategies and compensations to aid in completion of desired home tasks with rare min-A.  Baseline:  Goal status: MET  3.  Pt will utilize self-selected internal memory compensation to aid in short term memory during structured therapy task with 80% accuracy, given rare min-A.  Baseline: 03/21/2022 Goal status: MET  4.  Pt will teach back x3 cognitive fatigue management principles/strategies to facilitate return to engagement in desired activities with rare min-A.  Baseline:  Goal status: MET  5.  Pt will identify appropriate external memory aid to facilitate improved successful completion of task given personally relevant, hypothetical situation in 4/5 opportunities.  Baseline:  Goal status: MET  6.  Pt will complete standardized cognitive assessment within 1-2 therapy sessions.  Baseline:  Goal status: Deferred  LONG TERM GOALS: Target date: 04/18/22  Pt will report improved ability to participate in social  roles by 5 points via PROM by d/c.  Baseline:  Goal status: IN PROGRESS  2.  Pt will utilize trained strategies and compensations (fatigue management, external aids, etc.) to complete x3 self-selected daily tasks over 1 week period with mod-I.  Baseline:  Goal status: IN PROGRESS  3.  Through use of all targeted strategies, and adherence to HEP, pt will report subjective improvement in motivation by 20% by d/c.  Baseline: Currently rates motivation as 4/10 (1- not getting out of bed, 10-ideal motivation) Goal status: IN PROGRESS  4.  Pt will report carryover of attention strategies and compensation in x3 functional scenarios at home, resulting in improved performance of daily tasks, with mod-I over 1 week period.  Baseline:  Goal status: IN PROGRESS  5.  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  Baseline:  Goal status: IN PROGRESS  ASSESSMENT:  CLINICAL IMPRESSION: Patient is a 46 y.o. 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 Since ABI, pt presenting with mild-moderate impairment in short term, working, and prospective memory, attention, executive functioning, and verbal expression 2/2 cognitive deficits. Decklyn has successful carried over compensatory strategies for attention, processing, memory and thought organization to return to IADLs, writing her book, managing appointments and finances. She reports improved motivation, initiation. At this time, continue training compensations for attention with regards to possible return to work in the future. I recommend skilled ST to optimize pt's independence and reduce caregiver burden.   OBJECTIVE IMPAIRMENTS include attention, memory, executive functioning, and expressive language. These impairments are limiting  patient from return to work, managing appointments, household responsibilities, ADLs/IADLs, and effectively communicating at home and in community. Factors affecting potential to achieve goals and functional outcome are  n/a . Patient will benefit from skilled SLP services to address above impairments and improve overall function.  REHAB POTENTIAL: Good  PLAN: SLP FREQUENCY: 2x/week  SLP DURATION: 8 weeks  PLANNED INTERVENTIONS: Language facilitation, Cueing hierachy, Cognitive reorganization, Internal/external aids, Functional tasks, SLP instruction and feedback, Compensatory strategies, and Patient/family education    Su Monks, CCC-SLP 03/21/2022, 8:03 AM

## 2022-03-28 ENCOUNTER — Other Ambulatory Visit: Payer: Self-pay | Admitting: Registered Nurse

## 2022-04-07 ENCOUNTER — Ambulatory Visit: Payer: 59 | Attending: Registered Nurse | Admitting: Speech Pathology

## 2022-04-07 DIAGNOSIS — R41841 Cognitive communication deficit: Secondary | ICD-10-CM | POA: Diagnosis present

## 2022-04-07 NOTE — Therapy (Signed)
OUTPATIENT SPEECH LANGUAGE PATHOLOGY TREATMENT (DISCHARGE)   Patient Name: Judy Walsh MRN: [id number] DOB:June 25, 1976, 46 y.o., female Today's Date: 04/07/2022  PCP: none on file REFERRING PROVIDER: Bayard Hugger, NP   End of Session - 04/07/22 1351     Visit Number 7    Number of Visits 17    Date for SLP Re-Evaluation 04/18/22    Authorization Type [employer]    SLP Start Time 1351    SLP Stop Time  9449    SLP Time Calculation (min) 42 min    Activity Tolerance Patient tolerated treatment well                 Past Medical History:  Diagnosis Date   Chest x-ray abnormality 06/09/2021   CXR 11/22: Vague nodular opacities in the mid left lung and right upper lobe which may be due to overlapping structures. Comparison with older imaging studies or follow-up is recommended to exclude pulmonary nodule.    Myopericarditis 06/08/2021   admx 11/22 >> hsTrop mildly elevated w/o trend; BNP and CRP high - trending down at DC // Echocardiogram 11/22: no effusion, EF 55-60, no RWMA, mild LVH >> NSAIDs/Colchicine   Past Surgical History:  Procedure Laterality Date   IR ANGIO INTRA EXTRACRAN SEL INTERNAL CAROTID BILAT MOD SED  01/07/2022   IR ANGIO VERTEBRAL SEL VERTEBRAL UNI R MOD SED  01/08/2022   IR ANGIOGRAM FOLLOW UP STUDY  01/07/2022   IR ANGIOGRAM FOLLOW UP STUDY  01/07/2022   IR ANGIOGRAM FOLLOW UP STUDY  01/07/2022   IR ANGIOGRAM FOLLOW UP STUDY  01/07/2022   IR NEURO EACH ADD'L AFTER BASIC UNI LEFT (MS)  01/08/2022   IR NEURO EACH ADD'L AFTER BASIC UNI RIGHT (MS)  01/08/2022   IR TRANSCATH/EMBOLIZ  01/07/2022   RADIOLOGY WITH ANESTHESIA N/A 01/07/2022   Procedure: IR WITH ANESTHESIA;  Surgeon: Consuella Lose, MD;  Location: [place];  Service: Radiology;  Laterality: N/A;   Patient Active Problem List   Diagnosis Date Noted   Subarachnoid hemorrhage ([place]) 01/21/2022   Cephalgia 01/14/2022   Acute delirium 01/13/2022   SAH (subarachnoid hemorrhage) ([place]) 01/06/2022    Orthostatic hypotension 06/09/2021   Menorrhagia 06/09/2021   Chest x-ray abnormality 06/09/2021   Myopericarditis 06/08/2021    ONSET DATE: 01-21-22   REFERRING DIAG: I60.9 (ICD-10-CM) - Subarachnoid hemorrhage ([place])  THERAPY DIAG:  Cognitive communication deficit  Rationale for Evaluation and Treatment Rehabilitation  SUBJECTIVE:   SUBJECTIVE STATEMENT: Pt reports she is going back to work part time beginning Wednesday.   PAIN:  Are you having pain? No  SPEECH THERAPY DISCHARGE SUMMARY  Visits from Start of Care: 7  Current functional level related to goals / functional outcomes: Pt demonstrating increased participation in home based and avocational activities resulting from implantation of strategies and compensations to aid recall, initiation, and attention. Successful communication across variety of environments with diverse communication partners reported by pt. Returing to work this week in reduced capacity.    Remaining deficits: Mild, high level cognitive impairments in areas of executive function, memory, attention, processing. Decreased motivation and persisting cognitive fatigue.    Education / Equipment: Meta-cognitive strategy instruction, external cognitive assistive device selection and implementation, attention/memory/processing strategies and compensations, healthy brain habits    Patient agrees to discharge. Patient goals were met. Patient is being discharged due to being pleased with the current functional level.     OBJECTIVE:   TODAY'S TREATMENT:   04-07-22: Pt reports to not doing same  level of household activities as previously done but is satisfied with her participation. Is cooking dinner, doing laundry, returned to driving, completing daily routine, is reading. Planning to return to work in reduced capacity this week. Reviewed goal-plan-do-review executive function strategy through return to work lens. Pt able to ID task in which could be applied  following SLP example. Reviewed previously targeted compensations and strategies to aid in optimal cognitive functioning. Pt is pleased with current status, agreeable to d/c.    PATIENT EDUCATION: Education details: see above Person educated: Patient and Child(ren) Education method: Explanation, Demonstration, and Handouts Education comprehension: verbalized understanding, returned demonstration, and needs further education  GOALS: Goals reviewed with patient? Yes  SHORT TERM GOALS: Target date: 03/20/22  Pt will utilize external aid to prioritize, schedule, and complete x1 novel daily tasks over 1 week period.  Baseline: 02/28/22 Goal status: MET  2.  Pt will teach back attention strategies and compensations to aid in completion of desired home tasks with rare min-A.  Baseline:  Goal status: MET  3.  Pt will utilize self-selected internal memory compensation to aid in short term memory during structured therapy task with 80% accuracy, given rare min-A.  Baseline: 03/21/2022 Goal status: MET  4.  Pt will teach back x3 cognitive fatigue management principles/strategies to facilitate return to engagement in desired activities with rare min-A.  Baseline:  Goal status: MET  5.  Pt will identify appropriate external memory aid to facilitate improved successful completion of task given personally relevant, hypothetical situation in 4/5 opportunities.  Baseline:  Goal status: MET  6.  Pt will complete standardized cognitive assessment within 1-2 therapy sessions.  Baseline:  Goal status: Deferred  LONG TERM GOALS: Target date: 04/18/22  Pt will report improved ability to participate in social roles by 5 points via PROM by d/c.  Baseline:  Goal status: MET  2.  Pt will utilize trained strategies and compensations (fatigue management, external aids, etc.) to complete x3 self-selected daily tasks over 1 week period with mod-I.  Baseline:  Goal status: MET  3.  Through use of all  targeted strategies, and adherence to HEP, pt will report subjective improvement in motivation by 20% by d/c.  Baseline: Currently rates motivation as 4/10 (1- not getting out of bed, 10-ideal motivation); 8/10 04-07-22 Goal status: MET  4.  Pt will report carryover of attention strategies and compensation in x3 functional scenarios at home, resulting in improved performance of daily tasks, with mod-I over 1 week period.  Baseline:  Goal status: MET  5.  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  Baseline:  Goal status: deferred  ASSESSMENT:  CLINICAL IMPRESSION: Patient is a 46 y.o. 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 Since ABI, pt with mild high level cognitive deficits but has optimized cognitive functioning through strategy and compensation usage to return to participation in desired activities. 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 She reports improved motivation, initiation. Pt pleased with therapy course and outcomes, agreeable to ST d/c.   OBJECTIVE IMPAIRMENTS include attention, memory, executive functioning, and expressive language. These impairments are limiting patient from return to work, managing appointments, household responsibilities, ADLs/IADLs, and effectively communicating at home  and in community. Factors affecting potential to achieve goals and functional outcome are  n/a . Patient will benefit from skilled SLP services to address above impairments and improve overall function.  REHAB POTENTIAL: Good  PLAN: SLP FREQUENCY: 2x/week  SLP DURATION: 8 weeks  PLANNED INTERVENTIONS:  Language facilitation, Cueing hierachy, Cognitive reorganization, Internal/external aids, Functional tasks, SLP instruction and feedback, Compensatory strategies, and Patient/family education    Su Monks, CCC-SLP 04/07/2022, 1:52 PM

## 2022-04-07 NOTE — Patient Instructions (Signed)
Goal: This is where you establish what you hope to accomplish   Plan:  ? What will make this easier ? How much time do I need ? What tools do I need ? Can I delegate this?  ? Can I break it up into smaller steps   Do: Complete according to the plan, implementing strategies and compensations you need   Review: Evaluate how it went.  ? Should I change anything for next time    Time management for return to work! Remember you CANNOT fit 8 hours of work into 4 hours Prioritize your tasks, what is it important that you do and what can you delegate.    Use your strategies and compensations- Get things in writing Confirm understanding  Re-read your emails  Avoid multi-tasking and limit distractions  Schedule out your day, make sure to allot enough time to complete tasks Advocate for yourself!

## 2022-05-06 ENCOUNTER — Other Ambulatory Visit: Payer: Self-pay | Admitting: Registered Nurse

## 2022-05-12 ENCOUNTER — Encounter: Payer: Self-pay | Admitting: Neurology

## 2022-05-14 ENCOUNTER — Other Ambulatory Visit: Payer: Self-pay | Admitting: Neurosurgery

## 2022-05-14 ENCOUNTER — Encounter: Payer: 59 | Attending: Registered Nurse | Admitting: Physical Medicine & Rehabilitation

## 2022-05-14 ENCOUNTER — Encounter: Payer: Self-pay | Admitting: Physical Medicine & Rehabilitation

## 2022-05-14 VITALS — BP 153/110 | HR 79 | Ht 68.0 in | Wt 173.8 lb

## 2022-05-14 DIAGNOSIS — G441 Vascular headache, not elsewhere classified: Secondary | ICD-10-CM | POA: Insufficient documentation

## 2022-05-14 DIAGNOSIS — I609 Nontraumatic subarachnoid hemorrhage, unspecified: Secondary | ICD-10-CM | POA: Insufficient documentation

## 2022-05-14 MED ORDER — PROPRANOLOL HCL 10 MG PO TABS: 10.0000 mg | ORAL_TABLET | Freq: Three times a day (TID) | ORAL | 3 refills | Status: AC

## 2022-05-14 MED ORDER — TOPIRAMATE 50 MG PO TABS: 50.0000 mg | ORAL_TABLET | Freq: Two times a day (BID) | ORAL | 5 refills | Status: DC

## 2022-05-14 NOTE — Progress Notes (Signed)
Subjective:    Patient ID: Judy Walsh, female    DOB: 02/20/1976, 46 y.o.   MRN: [id number]  HPI  Judy Walsh is here in follow up of her [place] and ruptured ACPOM/PCOM anuerysm s/p coiling. She had a positive report from NS this week. Follow up CTA later this year?  She has returned to work but continues to have significant  headaches. They are located in the central frontal region and/or behind her right eye. They are constant but may be more severe after she's been active for awhile.  She is photosensitive. Noises can cause pain to be more severe.   She uses fioricet every 8 hours. She uses tramadol for more severe breakthrough pain but it doesn't help much.   She is working 6 hours per day mostly at a desk currently. 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   Her sleep is consistent with melatonin she takes at bedtime. Her mood has been worse because of the constant pain and that she's tired of feeling like she does.   She has gained some weight because she is more sedentary than before. She has tried to engage in different activities but just can't push through.  Pain Inventory Average Pain 7 Pain Right Now 7 My pain is constant and aching  In the last 24 hours, has pain interfered with the following? General activity 5 Relation with others 5 Enjoyment of life 5 What TIME of day is your pain at its worst? morning , daytime, evening, and night Sleep (in general) Poor  Pain is worse with: computers and light Pain improves with: rest and reduce light Relief from Meds: 0  Family History  Problem Relation Age of Onset   CVA Father    Social History   Socioeconomic History   Marital status: Single    Spouse name: Not on file   Number of children: Not on file   Years of education: Not on file   Highest education level: Not on file  Occupational History   Not on file  Tobacco Use   Smoking status: Former    Types: Cigarettes    Smokeless tobacco: Never  Vaping Use   Vaping Use: Never used  Substance and Sexual Activity   Alcohol use: Not Currently   Drug use: Never   Sexual activity: Not on file  Other Topics Concern   Not on file  Social History Narrative   Not on file   Social Determinants of Health   Financial Resource Strain: Not on file  Food Insecurity: Not on file  Transportation Needs: Not on file  Physical Activity: Not on file  Stress: Not on file  Social Connections: Not on file   Past Surgical History:  Procedure Laterality Date   IR ANGIO INTRA EXTRACRAN SEL INTERNAL CAROTID BILAT MOD SED  01/07/2022   IR ANGIO VERTEBRAL SEL VERTEBRAL UNI R MOD SED  01/08/2022   IR ANGIOGRAM FOLLOW UP STUDY  01/07/2022   IR ANGIOGRAM FOLLOW UP STUDY  01/07/2022   IR ANGIOGRAM FOLLOW UP STUDY  01/07/2022   IR ANGIOGRAM FOLLOW UP STUDY  01/07/2022   IR NEURO EACH ADD'L AFTER BASIC UNI LEFT (MS)  01/08/2022   IR NEURO EACH ADD'L AFTER BASIC UNI RIGHT (MS)  01/08/2022   IR TRANSCATH/EMBOLIZ  01/07/2022   RADIOLOGY WITH ANESTHESIA N/A 01/07/2022   Procedure: IR WITH ANESTHESIA;  Surgeon: Consuella Lose, MD;  Location: [place];  Service: Radiology;  Laterality: N/A;   Past Surgical History:  Procedure Laterality Date   IR ANGIO INTRA EXTRACRAN SEL INTERNAL CAROTID BILAT MOD SED  01/07/2022   IR ANGIO VERTEBRAL SEL VERTEBRAL UNI R MOD SED  01/08/2022   IR ANGIOGRAM FOLLOW UP STUDY  01/07/2022   IR ANGIOGRAM FOLLOW UP STUDY  01/07/2022   IR ANGIOGRAM FOLLOW UP STUDY  01/07/2022   IR ANGIOGRAM FOLLOW UP STUDY  01/07/2022   IR NEURO EACH ADD'L AFTER BASIC UNI LEFT (MS)  01/08/2022   IR NEURO EACH ADD'L AFTER BASIC UNI RIGHT (MS)  01/08/2022   IR TRANSCATH/EMBOLIZ  01/07/2022   RADIOLOGY WITH ANESTHESIA N/A 01/07/2022   Procedure: IR WITH ANESTHESIA;  Surgeon: Consuella Lose, MD;  Location: [place];  Service: Radiology;  Laterality: N/A;   Past Medical History:  Diagnosis Date   Chest x-ray abnormality 06/09/2021   CXR  11/22: Vague nodular opacities in the mid left lung and right upper lobe which may be due to overlapping structures. Comparison with older imaging studies or follow-up is recommended to exclude pulmonary nodule.    Myopericarditis 06/08/2021   admx 11/22 >> hsTrop mildly elevated w/o trend; BNP and CRP high - trending down at DC // Echocardiogram 11/22: no effusion, EF 55-60, no RWMA, mild LVH >> NSAIDs/Colchicine   BP (!) 153/110 Comment: checked x2  Pulse 79   Ht 5\' 8"  (1.727 m)   Wt 173 lb 12.8 oz (78.8 kg)   SpO2 95%   BMI 26.43 kg/m   Opioid Risk Score:   Fall Risk Score:  `1  Depression screen [facility] 2/9     05/14/2022   10:40 AM 02/12/2022   11:18 AM  Depression screen PHQ 2/9  Decreased Interest 0 0  Down, Depressed, Hopeless 0 0  PHQ - 2 Score 0 0  Altered sleeping  1  Tired, decreased energy  1  Change in appetite  0  Feeling bad or failure about yourself   0  Trouble concentrating  1  Moving slowly or fidgety/restless  0  Suicidal thoughts  0  PHQ-9 Score  3     Review of Systems  Constitutional:  Positive for unexpected weight change.       Weight gain  HENT: Negative.    Eyes: Negative.   Respiratory: Negative.    Cardiovascular: Negative.   Gastrointestinal: Negative.   Endocrine: Negative.   Genitourinary: Negative.   Musculoskeletal: Negative.   Skin: Negative.   Allergic/Immunologic: Negative.   Neurological:  Positive for headaches.  Hematological: Negative.   Psychiatric/Behavioral:  Positive for dysphoric mood.        Very tearful she reports (for no reason)  All other systems reviewed and are negative.      Objective:   Physical Exam   General: Alert and oriented x 3, No apparent distress HEENT: Head is normocephalic, atraumatic, PERRLA, EOMI, sclera anicteric, oral mucosa pink and moist, dentition intact, ext ear canals clear,  Neck: Supple without JVD or lymphadenopathy Heart: Reg rate and rhythm. No murmurs rubs or gallops Chest: CTA  bilaterally without wheezes, rales, or rhonchi; no distress Abdomen: Soft, non-tender, non-distended, bowel sounds positive. Extremities: No clubbing, cyanosis, or edema. Pulses are 2+ Psych: Pt's affect is appropriate. Pt is cooperative. Occasionally emotional, tearful Skin: Clean and intact without signs of breakdown Neuro:  Alert and oriented x 3. Normal insight and awareness. Intact Memory. Normal language and speech. Cranial nerve exam unremarkable. Normal motor. Sensory exam normal for light  touch and pain in all 4 limbs. No limb ataxia or cerebellar signs. No abnormal tone appreciated.   Musculoskeletal: Full ROM, No pain with AROM or PROM in the neck, trunk, or extremities. Posture appropriate    Medical Problem List and Plan: 1. 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  Status post coil embolization 01/07/2022 per Dr. Kathyrn Sheriff.             -moving well. Cognitively at baseline  -should limite work to 6 hours per day until headaches are improved 2.   Persistent vascular headaches:                -topamax --increase to 50mg  bid  -add propranolol 10mg  bid for 3 days then tid thereafter--start after topamax  -prn acetaminophen and ibuprofen--NOT SCHEDULED  -use fioricet only prn along with tramadol for severe breakthrough pain 4. Mood/Sleep: Melatonin 5 mg nightly.   5. Neuropsych/cognition: mood should improve as pain is better  -CONTINUE lexapro 6.  Hypertension. Metoprolol as above 7.  Myopericarditis.  per cardiology             -pt currently asymptomatic   30 minutes of face to face patient care time were spent during this visit. All questions were encouraged and answered.  Follow up with me in 2 mos .

## 2022-05-14 NOTE — Patient Instructions (Addendum)
ALWAYS FEEL FREE TO CALL OUR OFFICE WITH ANY PROBLEMS OR QUESTIONS ([phone])  **PLEASE NOTE** ALL MEDICATION REFILL REQUESTS (INCLUDING CONTROLLED SUBSTANCES) NEED TO BE MADE AT LEAST 7 DAYS PRIOR TO REFILL BEING DUE. ANY REFILL REQUESTS INSIDE THAT TIME FRAME MAY RESULT IN DELAYS IN RECEIVING YOUR PRESCRIPTION.    TOPAMAX: START TODAY   PROPRANOLOL: START IN 3 DAYS AT 1 PILL TWICE DAILY THEN AFTER 2 MORE DAYS INCREASE TO 3 X DAILY.    TRY ACETAMINOPHEN AND IBUPROFEN FOR PAIN AS NEEDED.   TAKE TRAMADOL AND FIORICET AS NEEDED ONLY.

## 2022-06-09 ENCOUNTER — Other Ambulatory Visit: Payer: Self-pay | Admitting: Neurosurgery

## 2022-06-10 ENCOUNTER — Other Ambulatory Visit: Payer: Self-pay | Admitting: Registered Nurse

## 2022-06-27 HISTORY — PX: CEREBRAL ANEURYSM REPAIR: SHX164

## 2022-07-11 ENCOUNTER — Other Ambulatory Visit: Payer: Self-pay | Admitting: Neurosurgery

## 2022-07-11 ENCOUNTER — Ambulatory Visit (HOSPITAL_COMMUNITY)
Admission: RE | Admit: 2022-07-11 | Discharge: 2022-07-11 | Disposition: A | Discharge status: Home / Self Care | Payer: 59 | Source: Ambulatory Visit | Attending: Neurosurgery | Admitting: Neurosurgery

## 2022-07-11 ENCOUNTER — Other Ambulatory Visit: Payer: Self-pay

## 2022-07-11 VITALS — BP 124/93 | HR 84 | Temp 98.5°F | Resp 12 | Ht 68.0 in | Wt 168.0 lb

## 2022-07-11 DIAGNOSIS — I609 Nontraumatic subarachnoid hemorrhage, unspecified: Secondary | ICD-10-CM

## 2022-07-11 DIAGNOSIS — Z87891 Personal history of nicotine dependence: Secondary | ICD-10-CM | POA: Insufficient documentation

## 2022-07-11 DIAGNOSIS — Z8679 Personal history of other diseases of the circulatory system: Secondary | ICD-10-CM | POA: Diagnosis not present

## 2022-07-11 DIAGNOSIS — I671 Cerebral aneurysm, nonruptured: Secondary | ICD-10-CM | POA: Diagnosis present

## 2022-07-11 DIAGNOSIS — Z79899 Other long term (current) drug therapy: Secondary | ICD-10-CM | POA: Diagnosis not present

## 2022-07-11 HISTORY — PX: IR ANGIO VERTEBRAL SEL VERTEBRAL UNI R MOD SED: IMG5368

## 2022-07-11 HISTORY — PX: IR ANGIO INTRA EXTRACRAN SEL INTERNAL CAROTID BILAT MOD SED: IMG5363

## 2022-07-11 LAB — BASIC METABOLIC PANEL
Anion gap: 5 (ref 5–15)
BUN: 14 mg/dL (ref 6–20)
CO2: 29 mmol/L (ref 22–32)
Calcium: 9.2 mg/dL (ref 8.9–10.3)
Chloride: 104 mmol/L (ref 98–111)
Creatinine, Ser: 0.75 mg/dL (ref 0.44–1.00)
GFR, Estimated: >60 mL/min (ref >60)
Glucose, Bld: 95 mg/dL (ref 70–99)
Potassium: 4.6 mmol/L (ref 3.5–5.1)
Sodium: 138 mmol/L (ref 135–145)

## 2022-07-11 LAB — PREGNANCY, URINE: Preg Test, Ur: NEGATIVE

## 2022-07-11 MED ORDER — MIDAZOLAM HCL 2 MG/2ML IJ SOLN
INTRAMUSCULAR | Status: AC | PRN
  Administered 2022-07-11: 1 mg via INTRAVENOUS

## 2022-07-11 MED ORDER — LIDOCAINE HCL 1 % IJ SOLN
INTRAMUSCULAR | Status: AC
  Filled 2022-07-11: qty 20

## 2022-07-11 MED ORDER — FENTANYL CITRATE (PF) 100 MCG/2ML IJ SOLN
INTRAMUSCULAR | Status: AC
  Filled 2022-07-11: qty 2

## 2022-07-11 MED ORDER — CEFAZOLIN SODIUM-DEXTROSE 2-4 GM/100ML-% IV SOLN: 2.0000 g | INTRAVENOUS | Status: DC

## 2022-07-11 MED ORDER — MIDAZOLAM HCL 2 MG/2ML IJ SOLN
INTRAMUSCULAR | Status: AC
  Filled 2022-07-11: qty 2

## 2022-07-11 MED ORDER — CHLORHEXIDINE GLUCONATE CLOTH 2 % EX PADS: 6.0000 | MEDICATED_PAD | Freq: Once | CUTANEOUS | Status: DC

## 2022-07-11 MED ORDER — HEPARIN SODIUM (PORCINE) 1000 UNIT/ML IJ SOLN
INTRAMUSCULAR | Status: AC
  Filled 2022-07-11: qty 10

## 2022-07-11 MED ORDER — SODIUM CHLORIDE 0.9 % IV SOLN: INTRAVENOUS | Status: DC

## 2022-07-11 MED ORDER — FENTANYL CITRATE (PF) 100 MCG/2ML IJ SOLN
INTRAMUSCULAR | Status: AC | PRN
  Administered 2022-07-11: 25 ug via INTRAVENOUS

## 2022-07-11 MED ORDER — HYDROCODONE-ACETAMINOPHEN 5-325 MG PO TABS: 1.0000 | ORAL_TABLET | ORAL | Status: DC | PRN

## 2022-07-11 MED ORDER — IOHEXOL 300 MG/ML  SOLN
100.0000 mL | Freq: Once | INTRAMUSCULAR | Status: AC | PRN
  Administered 2022-07-11: 50 mL via INTRA_ARTERIAL

## 2022-07-11 MED ORDER — HEPARIN SODIUM (PORCINE) 1000 UNIT/ML IJ SOLN
INTRAMUSCULAR | Status: AC | PRN
  Administered 2022-07-11: 2000 [IU] via INTRAVENOUS

## 2022-07-11 NOTE — H&P (Signed)
Chief Complaint   Aneurysm  History of Present Illness  Judy Walsh is a 46 year old woman I am seeing in follow-up now approximately 6 months status post subarachnoid hemorrhage and coil embolization of Acom and left Pcom aneurysms. She has made an excellent neurologic recovery. Unfortunately, she continues to complain of decreased energy and continued headaches. She continues to require Topamax, Daily Fioricet, and Zoloft. She presents for routine short-term angiographic f/u.  Past Medical History   Past Medical History:  Diagnosis Date   Chest x-ray abnormality 06/09/2021   CXR 11/22: Vague nodular opacities in the mid left lung and right upper lobe which may be due to overlapping structures. Comparison with older imaging studies or follow-up is recommended to exclude pulmonary nodule.    Myopericarditis 06/08/2021   admx 11/22 >> hsTrop mildly elevated w/o trend; BNP and CRP high - trending down at DC // Echocardiogram 11/22: no effusion, EF 55-60, no RWMA, mild LVH >> NSAIDs/Colchicine    Past Surgical History   Past Surgical History:  Procedure Laterality Date   IR ANGIO INTRA EXTRACRAN SEL INTERNAL CAROTID BILAT MOD SED  01/07/2022   IR ANGIO VERTEBRAL SEL VERTEBRAL UNI R MOD SED  01/08/2022   IR ANGIOGRAM FOLLOW UP STUDY  01/07/2022   IR ANGIOGRAM FOLLOW UP STUDY  01/07/2022   IR ANGIOGRAM FOLLOW UP STUDY  01/07/2022   IR ANGIOGRAM FOLLOW UP STUDY  01/07/2022   IR NEURO EACH ADD'L AFTER BASIC UNI LEFT (MS)  01/08/2022   IR NEURO EACH ADD'L AFTER BASIC UNI RIGHT (MS)  01/08/2022   IR TRANSCATH/EMBOLIZ  01/07/2022   RADIOLOGY WITH ANESTHESIA N/A 01/07/2022   Procedure: IR WITH ANESTHESIA;  Surgeon: Lisbeth Renshaw, MD;  Location: [facility] OR;  Service: Radiology;  Laterality: N/A;    Social History   Social History   Tobacco Use   Smoking status: Former    Types: Cigarettes   Smokeless tobacco: Never  Vaping Use   Vaping Use: Never used  Substance Use Topics   Alcohol use:  Not Currently   Drug use: Never    Medications   Prior to Admission medications   Medication Sig Start Date End Date Taking? Authorizing Provider  escitalopram (LEXAPRO) 10 MG tablet Take 10 mg by mouth daily. 04/23/22  Yes [provider]  melatonin 5 MG TABS Take 1 tablet (5 mg total) by mouth at bedtime. 02/25/22  Yes Ranelle Oyster, MD  Multiple Vitamin (MULTIVITAMIN WITH MINERALS) TABS tablet Take 1 tablet by mouth daily. 01/27/22  Yes Angiulli, Mcarthur Rossetti, PA-C  propranolol (INDERAL) 10 MG tablet Take 1 tablet (10 mg total) by mouth 3 (three) times daily. 05/14/22  Yes Ranelle Oyster, MD  topiramate (TOPAMAX) 50 MG tablet Take 1 tablet (50 mg total) by mouth 2 (two) times daily. 05/14/22  Yes Ranelle Oyster, MD  Butalbital-APAP-Caffeine 50-300-40 MG CAPS Take 2 capsules by mouth every 8 (eight) hours as needed. 04/07/22   [provider]  traMADol (ULTRAM) 50 MG tablet Take 1 tablet (50 mg total) by mouth every 6 (six) hours as needed. 01/27/22 01/27/23  Angiulli, Mcarthur Rossetti, PA-C    Allergies   Allergies  Allergen Reactions   Benadryl [Diphenhydramine] Hives    Review of Systems  ROS  Neurologic Exam  Awake, alert, oriented Memory and concentration grossly intact Speech fluent, appropriate CN grossly intact Motor exam: Upper Extremities Deltoid Bicep Tricep Grip  Right 5/5 5/5 5/5 5/5  Left 5/5 5/5 5/5 5/5   Lower  Extremities IP Quad PF DF EHL  Right 5/5 5/5 5/5 5/5 5/5  Left 5/5 5/5 5/5 5/5 5/5   Sensation grossly intact to LT  Impression  - 45 y.o. female 57mo s/p SAH, neurologically well  Plan  - Will proceed with elective short-term angiogram  I have reviewed the indications for the procedure as well as the details of the procedure and the expected postoperative course and recovery at length with the patient in the office. We have also reviewed in detail the risks, benefits, and alternatives to the procedure. All questions were answered and  Judy Walsh provided informed consent to proceed.  Lisbeth Renshaw, MD [facility]

## 2022-07-11 NOTE — Brief Op Note (Signed)
  NEUROSURGERY BRIEF OPERATIVE  NOTE   PREOP DX: Aneurysm  POSTOP DX: Same  PROCEDURE: Diagnostic cerebral angiogram  SURGEON: Dr. Lisbeth Renshaw, MD  ANESTHESIA: IV Sedation with Local  APPROACH: Right trans-femoral  EBL: Minimal  SPECIMENS: None  COMPLICATIONS: None  CONDITION: Stable to recovery  FINDINGS (Full report in [facility]): 1. Previously coiled anterior communicating artery aneurysm without residual or recurrence 2. 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

## 2022-07-11 NOTE — Sedation Documentation (Signed)
This RN transported pt to short stay. Pt alert, not complaining of pain. Shawna Orleans, RN at bedside. Right DP pulse is +2 and right PT pulse is +1. Right groin was clean, dry, intact and no external bleeding or hematoma noted.

## 2022-07-30 ENCOUNTER — Encounter: Payer: 59 | Attending: Registered Nurse | Admitting: Physical Medicine & Rehabilitation

## 2022-07-30 DIAGNOSIS — I609 Nontraumatic subarachnoid hemorrhage, unspecified: Secondary | ICD-10-CM | POA: Insufficient documentation

## 2022-07-30 DIAGNOSIS — G441 Vascular headache, not elsewhere classified: Secondary | ICD-10-CM | POA: Insufficient documentation

## 2022-10-01 NOTE — Progress Notes (Deleted)
NEUROLOGY CONSULTATION NOTE  Judy Walsh MRN: [id number] DOB: 08-27-1975  Referring provider: Consuella Lose, MD Primary care provider: ***  Reason for consult:  headache  Assessment/Plan:   ***   Subjective:  OANH Judy Walsh is a 47 year old female with history of myopericarditis who presents for headache.  History supplemented by referring provider's note.  On 01/06/2022, patient developed sudden onset severe headache.  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  She underwent cerebral angiogram and coil embolization of both aneurysms.  Since then, she continued to ***.  She was started on topiramate and daily Fioricet.  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   Current acute headache therapy:  Fioricet, Ubrelvy *** Current antihypertensive:  propranolol '10mg'$  TID Current antidepressant:  Lexapro *** Current antiepileptic:  topiramate '50mg'$  BID  Imaging (personally reviewed): 01/06/2022 CT HEAD WO:  1. Moderate volume of acute subarachnoid hemorrhage, described above. Recommend CTA to evaluate for aneurysm. 2. Borderline ventriculomegaly. Recommend continued attention on follow-up to exclude developing hydrocephalus. 01/06/2022 CTA HEAD:  1. Irregular, lobulated 5 x 4 mm outpouching rising from the left supraclinoid ICA, concerning for aneurysm that probably is ruptured given irregular morphology and subarachnoid hemorrhage seen on same day CT head.  2. 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  3. No emergent large vessel occlusion or proximal hemodynamically significant stenosis.  PAST MEDICAL HISTORY: Past Medical History:   Diagnosis Date   Chest x-ray abnormality 06/09/2021   CXR 11/22: Vague nodular opacities in the mid left lung and right upper lobe which may be due to overlapping structures. Comparison with older imaging studies or follow-up is recommended to exclude pulmonary nodule.    Myopericarditis 06/08/2021   admx 11/22 >> hsTrop mildly elevated w/o trend; BNP and CRP high - trending down at DC // Echocardiogram 11/22: no effusion, EF 55-60, no RWMA, mild LVH >> NSAIDs/Colchicine    PAST SURGICAL HISTORY: Past Surgical History:  Procedure Laterality Date   IR ANGIO INTRA EXTRACRAN SEL INTERNAL CAROTID BILAT MOD SED  01/07/2022   IR ANGIO INTRA EXTRACRAN SEL INTERNAL CAROTID BILAT MOD SED  07/11/2022   IR ANGIO VERTEBRAL SEL VERTEBRAL UNI R MOD SED  01/08/2022   IR ANGIO VERTEBRAL SEL VERTEBRAL UNI R MOD SED  07/11/2022   IR ANGIOGRAM FOLLOW UP STUDY  01/07/2022   IR ANGIOGRAM FOLLOW UP STUDY  01/07/2022   IR ANGIOGRAM FOLLOW UP STUDY  01/07/2022   IR ANGIOGRAM FOLLOW UP STUDY  01/07/2022   IR NEURO EACH ADD'L AFTER BASIC UNI LEFT (MS)  01/08/2022   IR NEURO EACH ADD'L AFTER BASIC UNI RIGHT (MS)  01/08/2022   IR TRANSCATH/EMBOLIZ  01/07/2022   RADIOLOGY WITH ANESTHESIA N/A 01/07/2022   Procedure: IR WITH ANESTHESIA;  Surgeon: Consuella Lose, MD;  Location: [facility];  Service: Radiology;  Laterality: N/A;    MEDICATIONS: Current Outpatient Medications on File Prior to Visit  Medication Sig Dispense Refill   amoxicillin-clavulanate (AUGMENTIN) 875-125 MG tablet Take 1 tablet by mouth 2 (two) times daily.     Butalbital-APAP-Caffeine (FIORICET) 50-300-40 MG CAPS Take by mouth.     Butalbital-APAP-Caffeine 50-300-40 MG CAPS Take 2 capsules by mouth every 8 (eight) hours as needed.     Cholecalciferol (VITAMIN  D3) 50 MCG (2000 UT) TABS Take 1 tablet by mouth daily.     cyclobenzaprine (FLEXERIL) 10 MG tablet Take 1 tablet by mouth 3 (three) times daily.     escitalopram (LEXAPRO) 10 MG tablet Take 10 mg  by mouth daily.     escitalopram (LEXAPRO) 10 MG tablet Take 1 tablet by mouth daily.     MAG-G 500 (27 Mg) MG TABS Take 1 tablet by mouth daily.     melatonin 5 MG TABS Take 1 tablet (5 mg total) by mouth at bedtime. 30 tablet 0   Multiple Vitamin (MULTIVITAMIN WITH MINERALS) TABS tablet Take 1 tablet by mouth daily.     OZEMPIC, 0.25 OR 0.5 MG/DOSE, 2 MG/3ML SOPN SMARTSIG:0.25 Milligram(s) SUB-Q Once a Week     phentermine (ADIPEX-P) 37.5 MG tablet Take by oral route for 30 days.     pravastatin (PRAVACHOL) 40 MG tablet Take 40 mg by mouth daily.     predniSONE (DELTASONE) 5 MG tablet Take 1 tablet by mouth daily.     progesterone (PROMETRIUM) 200 MG capsule SMARTSIG:1 Caplet By Mouth Every Evening     propranolol (INDERAL) 10 MG tablet Take 1 tablet (10 mg total) by mouth 3 (three) times daily. 90 tablet 3   topiramate (TOPAMAX) 25 MG tablet      topiramate (TOPAMAX) 50 MG tablet Take 1 tablet (50 mg total) by mouth 2 (two) times daily. 60 tablet 5   traMADol (ULTRAM) 50 MG tablet Take 1 tablet (50 mg total) by mouth every 6 (six) hours as needed. 20 tablet 0   UBRELVY 100 MG TABS Take 1 tablet by mouth once.     UBRELVY 50 MG TABS Take by mouth.     No current facility-administered medications on file prior to visit.    ALLERGIES: Allergies  Allergen Reactions   Benadryl [Diphenhydramine] Hives   Diphenhydramine Hcl     FAMILY HISTORY: Family History  Problem Relation Age of Onset   CVA Father     Objective:  *** General: No acute distress.  Patient appears well-groomed.   Head:  Normocephalic/atraumatic Eyes:  fundi examined but not visualized Neck: supple, no paraspinal tenderness, full range of motion Back: No paraspinal tenderness Heart: regular rate and rhythm Lungs: Clear to auscultation bilaterally. Vascular: No carotid bruits. Neurological Exam: Mental status: alert and oriented to person, place, and time, speech fluent and not dysarthric, language  intact. Cranial nerves: CN I: not tested CN II: pupils equal, round and reactive to light, visual fields intact CN III, IV, VI:  full range of motion, no nystagmus, no ptosis CN V: facial sensation intact. CN VII: upper and lower face symmetric CN VIII: hearing intact CN IX, X: gag intact, uvula midline CN XI: sternocleidomastoid and trapezius muscles intact CN XII: tongue midline Bulk & Tone: normal, no fasciculations. Motor:  muscle strength 5/5 throughout Sensation:  Pinprick, temperature and vibratory sensation intact. Deep Tendon Reflexes:  2+ throughout,  toes downgoing.   Finger to nose testing:  Without dysmetria.   Heel to shin:  Without dysmetria.   Gait:  Normal station and stride.  Romberg negative.    Thank you for allowing me to take part in the care of this patient.  Metta Clines, DO  CC: ***

## 2022-10-02 ENCOUNTER — Ambulatory Visit: Payer: 59 | Admitting: Neurology

## 2022-10-02 ENCOUNTER — Encounter: Payer: Self-pay | Admitting: Neurology

## 2022-10-02 DIAGNOSIS — Z029 Encounter for administrative examinations, unspecified: Secondary | ICD-10-CM

## 2022-10-16 ENCOUNTER — Other Ambulatory Visit: Payer: Self-pay | Admitting: Physical Medicine & Rehabilitation

## 2022-10-16 DIAGNOSIS — I609 Nontraumatic subarachnoid hemorrhage, unspecified: Secondary | ICD-10-CM

## 2022-10-16 DIAGNOSIS — G441 Vascular headache, not elsewhere classified: Secondary | ICD-10-CM

## 2022-10-21 NOTE — Progress Notes (Signed)
NEUROLOGY CONSULTATION NOTE  Judy Walsh MRN: [id number] DOB: [birth year]  Referring provider: Lisbeth Renshaw, MD Primary care provider: Earnest Rosier, MD  Reason for consult:  headache  Assessment/Plan:   Migraine without aura, without status migrainosus, not intractable Chronic headache, may be medication-overuse or possibly related to blood pressure Cerebral aneurysm s/p coil Elevated blood pressure - normal today but has been elevated.   She defers preventative medication at this time. Instead she will consider caffeine cessation, limiting pain relievers to no more than 2 days out of week and possibly supplements (magnesium citrate, riboflavin, CoQ10) Continue Ubrelvy as needed but instructed that she may repeat dose after 2 hours (maximum 2 tablets in 24 hours) Follow up with PCP regarding blood pressure Cerebral aneurysm monitoring as managed by neurosurgery Follow up in 5 months.   Subjective:  Judy Walsh is a 47 year old female with history of myopericarditis who presents for headache.  History supplemented by referring provider's note.  On 01/06/2022, patient developed sudden onset severe headache and burning in the eyes.  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  She underwent cerebral angiogram and coil embolization of both aneurysms.  Following the treatment, she continued to have headaches.  She was started on topiramate and daily Fioricet.  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  No family history of aneurysms.  Headaches are usually 5/10 right temporal/retro-orbital stabbing pain or between eyes.  Associated photophobia,  phonophobia, blurred vision or seeing stars, difficulty remembering (names), sometimes nausea.  Usually last at least an hour (treats [place]).  Initially occurred twice a week, now occurring once every 2-3 weeks.  However, she has a mild headache every other day which she treats with ibuprofen or Tylenol.  Takes either around 4 days a week.  No known triggers.  Turning off the lights help.  During severe headache, BP runs 140s/90s-100s.    She does have remote history of headaches in 2022 prior to pericarditis.  They subsequently resolved.     Current acute headache therapy:  Bernita Raisin 100mg  (effective 50% of time but not repeating dose) Current antidepressant:  Lexapro 10mg  daily   Past acute headache therapy:  Fioricet, tramadol, Flexeril Past antihypertensive:  propranolol 10mg  TID (weight gain) Past antiepileptic:  topiramate 50mg  BID  Caffeine:  4 cups of coffee daily Hydrates.  Imaging (personally reviewed): 01/06/2022 CT HEAD WO:  1. Moderate volume of acute subarachnoid hemorrhage, described above. Recommend CTA to evaluate for aneurysm. 2. Borderline ventriculomegaly. Recommend continued attention on follow-up to exclude developing hydrocephalus. 01/06/2022 CTA HEAD:  1. Irregular, lobulated 5 x 4 mm outpouching rising from the left supraclinoid ICA, concerning for aneurysm that probably is ruptured given irregular morphology and subarachnoid hemorrhage seen on same day CT head.  2. 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  3. No emergent large vessel occlusion or proximal hemodynamically significant stenosis.  PAST MEDICAL HISTORY: Past Medical History:  Diagnosis Date   Chest x-ray abnormality 06/09/2021   CXR 11/22: Vague nodular opacities in the mid left lung and right upper lobe which may be due to overlapping structures. Comparison with older imaging studies or follow-up is recommended to exclude pulmonary nodule.    Myopericarditis 06/08/2021   admx 11/22 >>  hsTrop mildly elevated w/o trend; BNP and CRP high - trending down at DC // Echocardiogram 11/22: no effusion, EF 55-60, no RWMA, mild LVH >> NSAIDs/Colchicine    PAST SURGICAL HISTORY: Past Surgical History:  Procedure Laterality Date   IR ANGIO INTRA EXTRACRAN SEL INTERNAL CAROTID BILAT MOD SED  01/07/2022   IR ANGIO INTRA EXTRACRAN SEL INTERNAL CAROTID BILAT MOD SED  07/11/2022   IR ANGIO VERTEBRAL SEL VERTEBRAL UNI R MOD SED  01/08/2022   IR ANGIO VERTEBRAL SEL VERTEBRAL UNI R MOD SED  07/11/2022   IR ANGIOGRAM FOLLOW UP STUDY  01/07/2022   IR ANGIOGRAM FOLLOW UP STUDY  01/07/2022   IR ANGIOGRAM FOLLOW UP STUDY  01/07/2022   IR ANGIOGRAM FOLLOW UP STUDY  01/07/2022   IR NEURO EACH ADD'L AFTER BASIC UNI LEFT (MS)  01/08/2022   IR NEURO EACH ADD'L AFTER BASIC UNI RIGHT (MS)  01/08/2022   IR TRANSCATH/EMBOLIZ  01/07/2022   RADIOLOGY WITH ANESTHESIA N/A 01/07/2022   Procedure: IR WITH ANESTHESIA;  Surgeon: Lisbeth Renshaw, MD;  Location: [facility] OR;  Service: Radiology;  Laterality: N/A;    MEDICATIONS: Current Outpatient Medications on File Prior to Visit  Medication Sig Dispense Refill   amoxicillin-clavulanate (AUGMENTIN) 875-125 MG tablet Take 1 tablet by mouth 2 (two) times daily.     Butalbital-APAP-Caffeine (FIORICET) 50-300-40 MG CAPS Take by mouth.     Butalbital-APAP-Caffeine 50-300-40 MG CAPS Take 2 capsules by mouth every 8 (eight) hours as needed.     Cholecalciferol (VITAMIN D3) 50 MCG (2000 UT) TABS Take 1 tablet by mouth daily.     cyclobenzaprine (FLEXERIL) 10 MG tablet Take 1 tablet by mouth 3 (three) times daily.     escitalopram (LEXAPRO) 10 MG tablet Take 10 mg by mouth daily.     escitalopram (LEXAPRO) 10 MG tablet Take 1 tablet by mouth daily.     MAG-G 500 (27 Mg) MG TABS Take 1 tablet by mouth daily.     melatonin 5 MG TABS Take 1 tablet (5 mg total) by mouth at bedtime. 30 tablet 0   Multiple Vitamin (MULTIVITAMIN WITH MINERALS) TABS tablet Take 1 tablet by mouth daily.      OZEMPIC, 0.25 OR 0.5 MG/DOSE, 2 MG/3ML SOPN SMARTSIG:0.25 Milligram(s) SUB-Q Once a Week     phentermine (ADIPEX-P) 37.5 MG tablet Take by oral route for 30 days.     pravastatin (PRAVACHOL) 40 MG tablet Take 40 mg by mouth daily.     predniSONE (DELTASONE) 5 MG tablet Take 1 tablet by mouth daily.     progesterone (PROMETRIUM) 200 MG capsule SMARTSIG:1 Caplet By Mouth Every Evening     propranolol (INDERAL) 10 MG tablet Take 1 tablet (10 mg total) by mouth 3 (three) times daily. 90 tablet 3   topiramate (TOPAMAX) 25 MG tablet      topiramate (TOPAMAX) 50 MG tablet Take 1 tablet (50 mg total) by mouth 2 (two) times daily. 60 tablet 5   traMADol (ULTRAM) 50 MG tablet Take 1 tablet (50 mg total) by mouth every 6 (six) hours as needed. 20 tablet 0   UBRELVY 100 MG TABS Take 1 tablet by mouth once.     UBRELVY 50 MG TABS Take by mouth.     No current facility-administered medications on file prior to visit.    ALLERGIES: Allergies  Allergen Reactions   Benadryl [Diphenhydramine] Hives   Diphenhydramine Hcl     FAMILY HISTORY: Family History  Problem Relation Age of Onset  CVA Father     Objective:  Blood pressure 124/80, pulse 87, height 5\' 8"  (1.727 m), weight 174 lb 3.2 oz (79 kg), SpO2 100 %. General: No acute distress.  Patient appears well-groomed.   Head:  Normocephalic/atraumatic Eyes:  fundi examined but not visualized Neck: supple, no paraspinal tenderness, full range of motion Back: No paraspinal tenderness Heart: regular rate and rhythm Lungs: Clear to auscultation bilaterally. Vascular: No carotid bruits. Neurological Exam: Mental status: alert and oriented to person, place, and time, speech fluent and not dysarthric, language intact. Cranial nerves: CN I: not tested CN II: pupils equal, round and reactive to light, visual fields intact CN III, IV, VI:  full range of motion, no nystagmus, no ptosis CN V: facial sensation intact. CN VII: upper and lower face  symmetric CN VIII: hearing intact CN IX, X: gag intact, uvula midline CN XI: sternocleidomastoid and trapezius muscles intact CN XII: tongue midline Bulk & Tone: normal, no fasciculations. Motor:  muscle strength 5/5 throughout Sensation:  Pinprick, temperature and vibratory sensation intact. Deep Tendon Reflexes:  2+ throughout,  toes downgoing.   Finger to nose testing:  Without dysmetria.   Heel to shin:  Without dysmetria.   Gait:  Normal station and stride.  Romberg negative.    Thank you for allowing me to take part in the care of this patient.  Shon Millet, DO  CC:  Lisbeth Renshaw, MD  Theophilus Kinds, MD

## 2022-10-22 ENCOUNTER — Ambulatory Visit (INDEPENDENT_AMBULATORY_CARE_PROVIDER_SITE_OTHER): Payer: 59 | Admitting: Neurology

## 2022-10-22 ENCOUNTER — Encounter: Payer: Self-pay | Admitting: Neurology

## 2022-10-22 VITALS — BP 124/80 | HR 87 | Ht 68.0 in | Wt 174.2 lb

## 2022-10-22 DIAGNOSIS — R030 Elevated blood-pressure reading, without diagnosis of hypertension: Secondary | ICD-10-CM

## 2022-10-22 DIAGNOSIS — G43009 Migraine without aura, not intractable, without status migrainosus: Secondary | ICD-10-CM

## 2022-10-22 DIAGNOSIS — I671 Cerebral aneurysm, nonruptured: Secondary | ICD-10-CM

## 2022-10-22 NOTE — Patient Instructions (Signed)
  Take Ubrelvy at earliest onset of headache.  May repeat dose once in 2 hours if needed.  Maximum 2 tablets in 24 hours. Limit use of pain relievers to no more than 2 days out of the week.  These medications include acetaminophen, NSAIDs (ibuprofen/Advil/Motrin, naproxen/Aleve, triptans (Imitrex/sumatriptan), Excedrin, and narcotics.  This will help reduce risk of rebound headaches. Routine exercise Stay adequately hydrated (aim for 64 oz water daily) Keep headache diary Maintain proper stress management Maintain proper sleep hygiene Do not skip meals Consider supplements:  magnesium citrate 400mg  daily, riboflavin 400mg  daily, coenzyme Q10 300mg  daily.

## 2023-01-27 ENCOUNTER — Ambulatory Visit: Payer: 59 | Admitting: Neurology

## 2023-03-31 ENCOUNTER — Other Ambulatory Visit: Payer: Self-pay | Admitting: Neurosurgery

## 2023-03-31 DIAGNOSIS — I609 Nontraumatic subarachnoid hemorrhage, unspecified: Secondary | ICD-10-CM

## 2023-04-06 NOTE — Progress Notes (Deleted)
NEUROLOGY FOLLOW UP OFFICE NOTE  Judy Walsh [id number]  Assessment/Plan:   Migraine without aura, without status migrainosus, not intractable Chronic headache, may be medication-overuse or possibly related to blood pressure Cerebral aneurysm s/p coil Elevated blood pressure - normal today but has been elevated.   She defers preventative medication at this time. Instead she will consider caffeine cessation, limiting pain relievers to no more than 2 days out of week and possibly supplements (magnesium citrate, riboflavin, CoQ10) Continue Ubrelvy as needed but instructed that she may repeat dose after 2 hours (maximum 2 tablets in 24 hours) Follow up with PCP regarding blood pressure Cerebral aneurysm monitoring as managed by neurosurgery Follow up in 5 months.   Subjective:  Judy Walsh is a 47 year old female with history of myopericarditis who follows up for headaches.  UPDATE: Intensity:  *** Duration:  *** Frequency:  *** Frequency of abortive medication: *** Current NSAIDS/analgesics:  *** Current triptans:  none Current ergotamine:  none Current anti-emetic:  none Current muscle relaxants:  none Current Antihypertensive medications:  none Current Antidepressant medications:  Lexapro 10mg  daily Current Anticonvulsant medications:  none Current anti-CGRP:  Ubrelvy 100mg  Current Vitamins/Herbal/Supplements:  magnesium citrate 400mg  daily, riboflavin 400mg  daily, CoQ10 300mg  daily *** Current Antihistamines/Decongestants:  none Other therapy:  ***  Caffeine:  4 cups of coffee daily Hydrates.  HISTORY: On 01/06/2022, patient developed sudden onset severe headache and burning in the eyes.  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  She underwent cerebral angiogram and coil embolization of both aneurysms.  Following the  treatment, she continued to have headaches.  She was started on topiramate and daily Fioricet.  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  No family history of aneurysms.  Headaches are usually 5/10 right temporal/retro-orbital stabbing pain or between eyes.  Associated photophobia, phonophobia, blurred vision or seeing stars, difficulty remembering (names), sometimes nausea.  Usually last at least an hour (treats [place]).  Initially occurred twice a week, now occurring once every 2-3 weeks.  However, she has a mild headache every other day which she treats with ibuprofen or Tylenol.  Takes either around 4 days a week.  No known triggers.  Turning off the lights help.  During severe headache, BP runs 140s/90s-100s.    She does have remote history of headaches in 2022 prior to pericarditis.  They subsequently resolved.      Past acute headache therapy:  Fioricet, tramadol, Flexeril Past antihypertensive:  propranolol 10mg  TID (weight gain) Past antiepileptic:  topiramate 50mg  BID   Imaging: 01/06/2022 CT HEAD WO:  1. Moderate volume of acute subarachnoid hemorrhage, described above. Recommend CTA to evaluate for aneurysm. 2. Borderline ventriculomegaly. Recommend continued attention on follow-up to exclude developing hydrocephalus. 01/06/2022 CTA HEAD:  1. Irregular, lobulated 5 x 4 mm outpouching rising from the left supraclinoid ICA, concerning for aneurysm that probably is ruptured given irregular morphology and subarachnoid hemorrhage seen on same day CT head.  2. 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  3. No emergent large vessel occlusion or proximal hemodynamically significant stenosis.  PAST MEDICAL HISTORY: Past Medical History:  Diagnosis Date   Anxiety    Chest x-ray  abnormality 06/09/2021   CXR 11/22: Vague nodular opacities in  the mid left lung and right upper lobe which may be due to overlapping structures. Comparison with older imaging studies or follow-up is recommended to exclude pulmonary nodule.    Family history of brain aneurysm    High cholesterol    Migraine    Myopericarditis 06/08/2021   admx 11/22 >> hsTrop mildly elevated w/o trend; BNP and CRP high - trending down at DC // Echocardiogram 11/22: no effusion, EF 55-60, no RWMA, mild LVH >> NSAIDs/Colchicine    MEDICATIONS: Current Outpatient Medications on File Prior to Visit  Medication Sig Dispense Refill   amoxicillin-clavulanate (AUGMENTIN) 875-125 MG tablet Take 1 tablet by mouth 2 (two) times daily.     Cholecalciferol (VITAMIN D3) 50 MCG (2000 UT) TABS Take 1 tablet by mouth daily.     escitalopram (LEXAPRO) 10 MG tablet Take 10 mg by mouth daily.     escitalopram (LEXAPRO) 10 MG tablet Take 1 tablet by mouth daily.     Multiple Vitamin (MULTIVITAMIN WITH MINERALS) TABS tablet Take 1 tablet by mouth daily.     phentermine (ADIPEX-P) 37.5 MG tablet Take by oral route for 30 days.     pravastatin (PRAVACHOL) 40 MG tablet Take 40 mg by mouth daily.     predniSONE (DELTASONE) 5 MG tablet Take 1 tablet by mouth daily.     progesterone (PROMETRIUM) 200 MG capsule SMARTSIG:1 Caplet By Mouth Every Evening     propranolol (INDERAL) 10 MG tablet Take 1 tablet (10 mg total) by mouth 3 (three) times daily. 90 tablet 3   UBRELVY 100 MG TABS Take 1 tablet by mouth once.     UBRELVY 50 MG TABS Take by mouth.     No current facility-administered medications on file prior to visit.    ALLERGIES: Allergies  Allergen Reactions   Benadryl [Diphenhydramine] Hives   Diphenhydramine Hcl     FAMILY HISTORY: Family History  Problem Relation Age of Onset   Stroke Father    CVA Father       Objective:  *** General: No acute distress.  Patient appears ***-groomed.   Head:   Normocephalic/atraumatic Eyes:  Fundi examined but not visualized Neck: supple, no paraspinal tenderness, full range of motion Heart:  Regular rate and rhythm Lungs:  Clear to auscultation bilaterally Back: No paraspinal tenderness Neurological Exam: alert and oriented.  Speech fluent and not dysarthric, language intact.  CN II-XII intact. Bulk and tone normal, muscle strength 5/5 throughout.  Sensation to light touch intact.  Deep tendon reflexes 2+ throughout, toes downgoing.  Finger to nose testing intact.  Gait normal, Romberg negative.   Shon Millet, DO  CC: ***

## 2023-04-07 ENCOUNTER — Ambulatory Visit: Payer: 59 | Admitting: Neurology

## 2023-06-02 ENCOUNTER — Other Ambulatory Visit: Payer: Self-pay | Admitting: Neurosurgery

## 2023-06-29 IMAGING — CT CT HEAD W/O CM
2 of 3 series · 13 of 47 positions shown, 16 images · non-contrast
Comparison: Head CT earlier today at [DATE] p.m., CTA head and neck
today [DATE] p.m.
COMPARISON: Head CT earlier today at [DATE] p.m., CTA head and neck
today [DATE] p.m.

Addendum:
CLINICAL DATA: Subarachnoid hemorrhage follow-up. Mental status
changes.



[Series 2: head wo · axial · 0.50mm/px · z∈[-161,-26]mm · 10 of 33 slices shown, 13 images]
[im 3/33  brain]
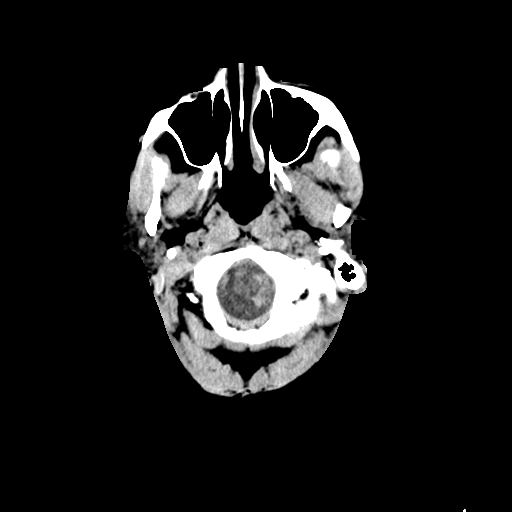
[im 3/33  bone]
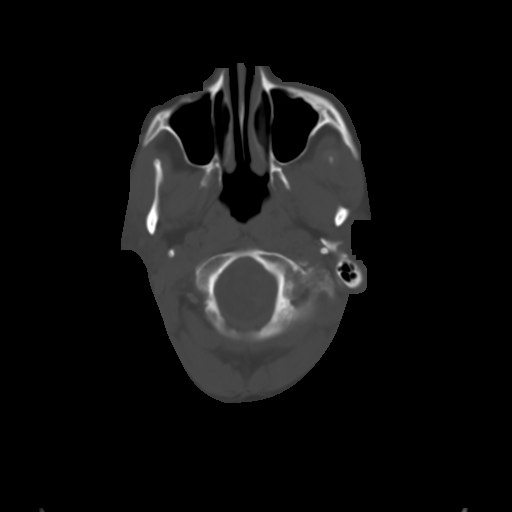
[im 6/33  brain]
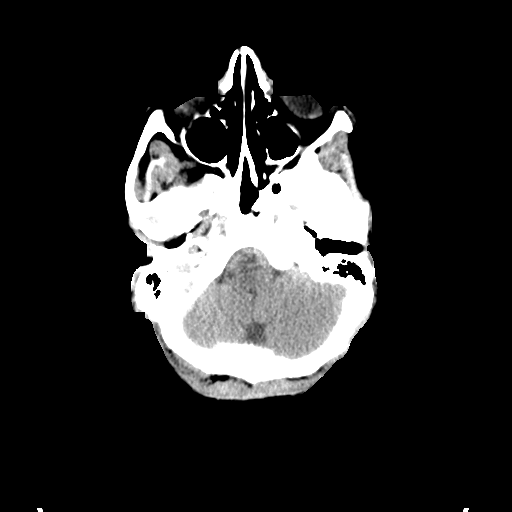
[im 9/33  brain]
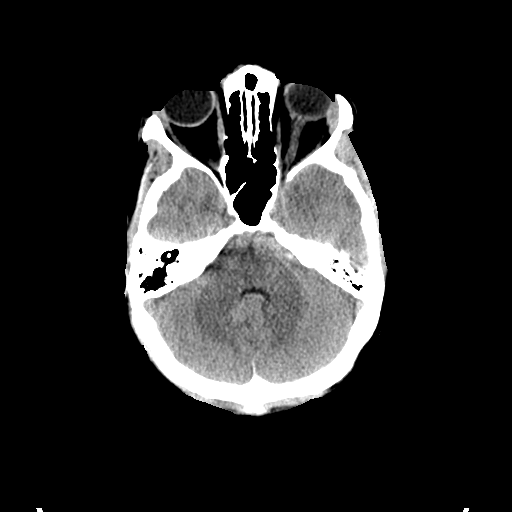
[im 12/33  brain]
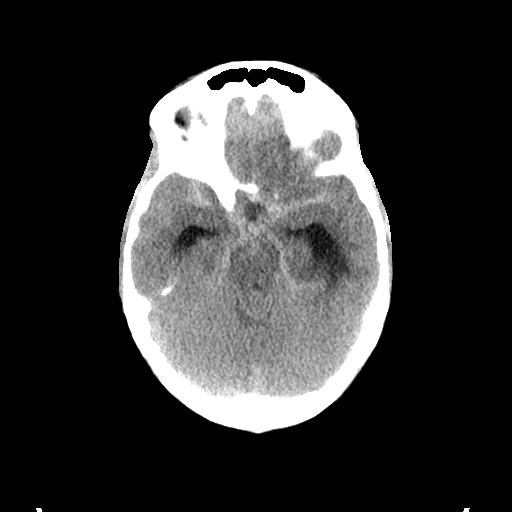
[im 15/33  brain]
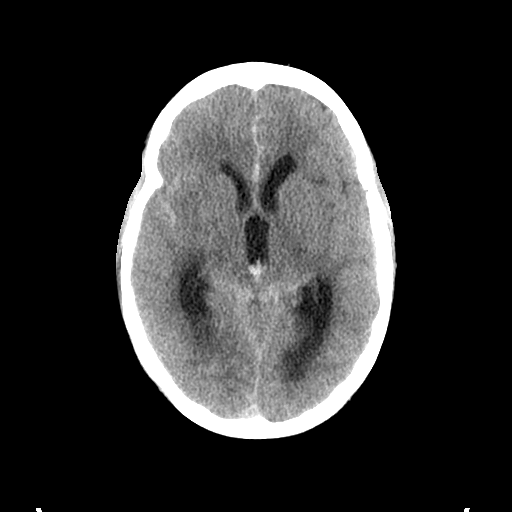
[im 15/33  bone]
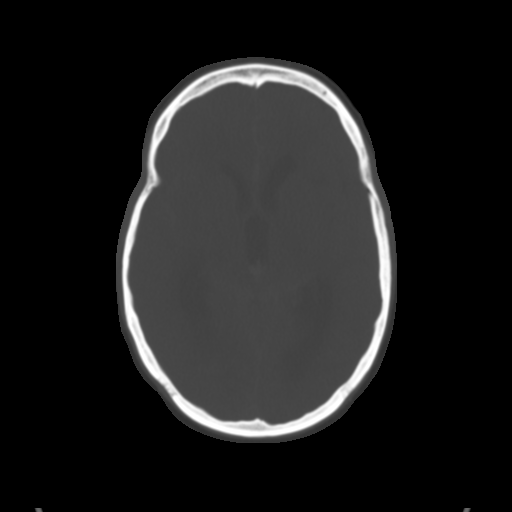
[im 18/33  brain]
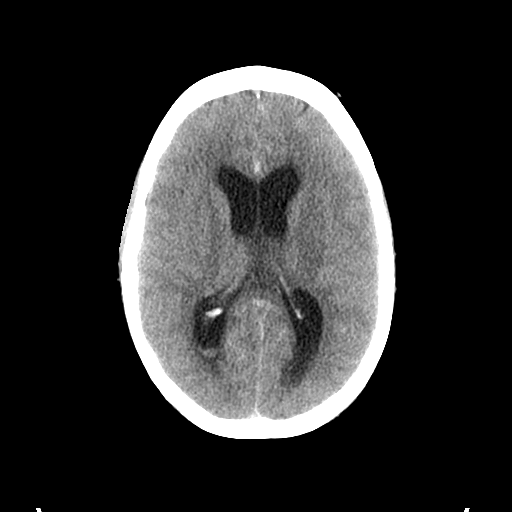
[im 21/33  brain]
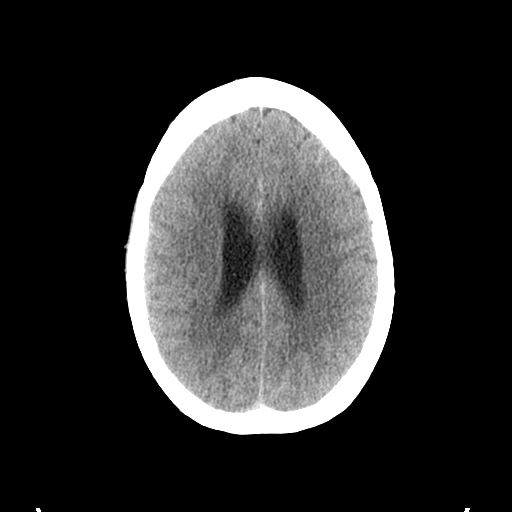
[im 25/33  brain]
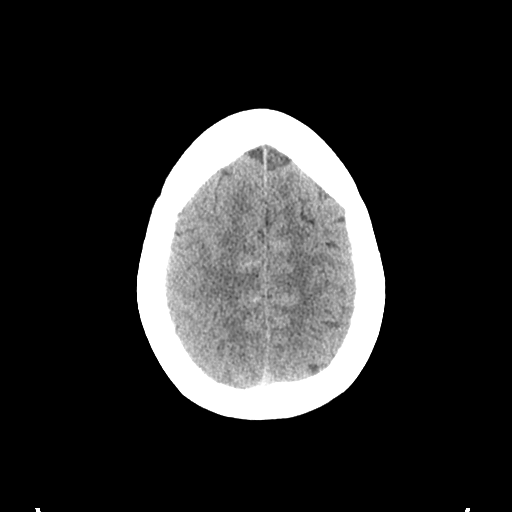
[im 27/33  brain]
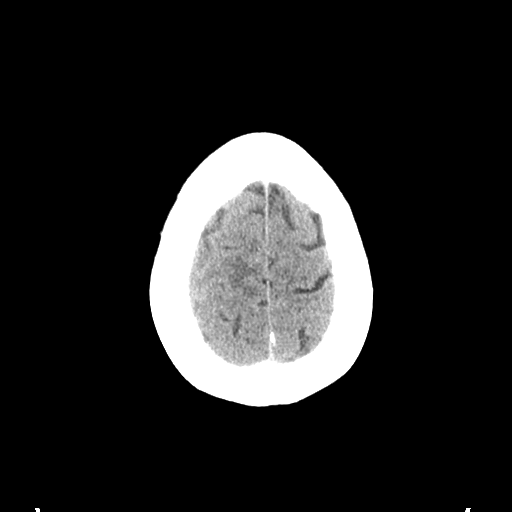
[im 27/33  bone]
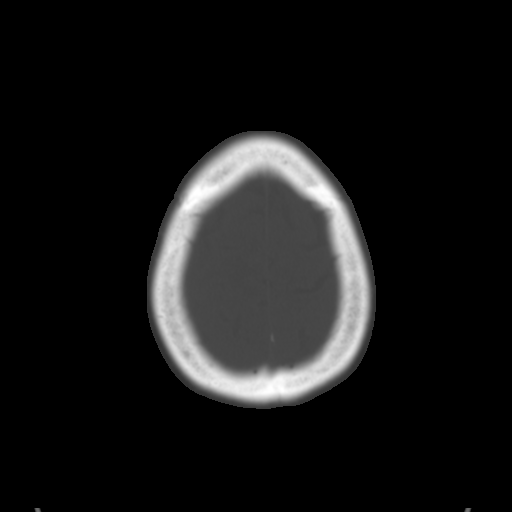
[im 30/33  brain]
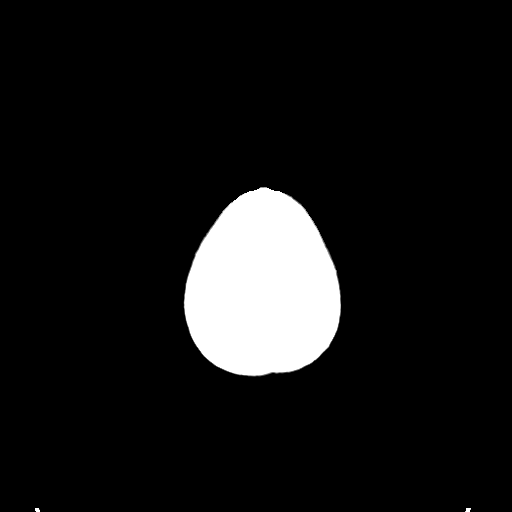

[Series 4: coronal soft tissue · coronal · 0.32mm/px · 3 of 64 slices shown]
[im 22/64  brain]
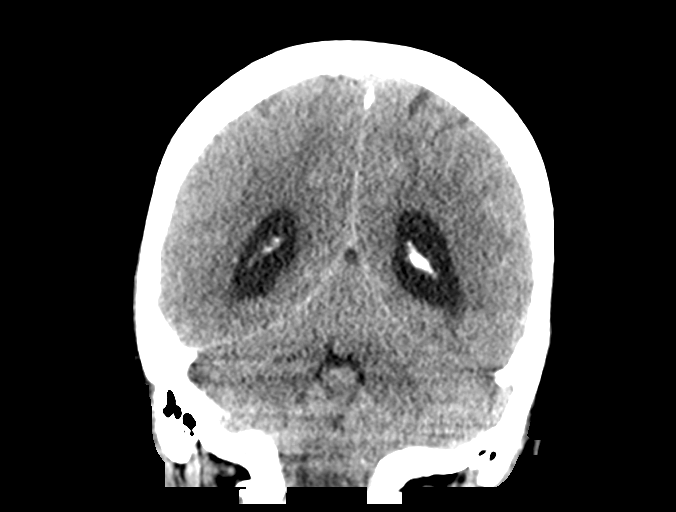
[im 29/64  brain]
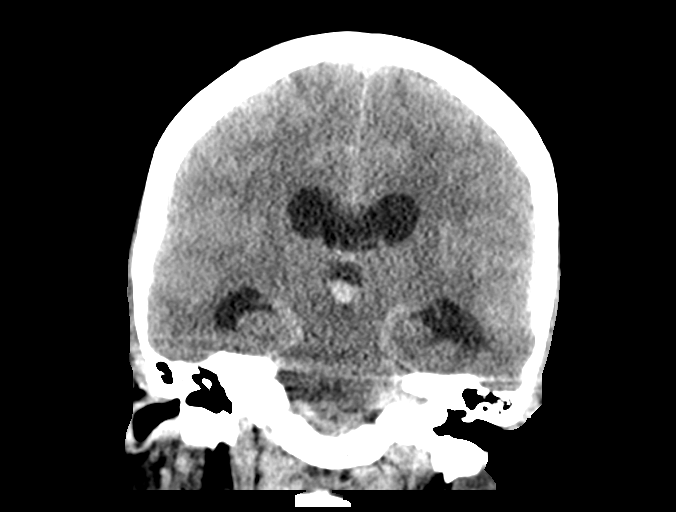
[im 36/64  brain]
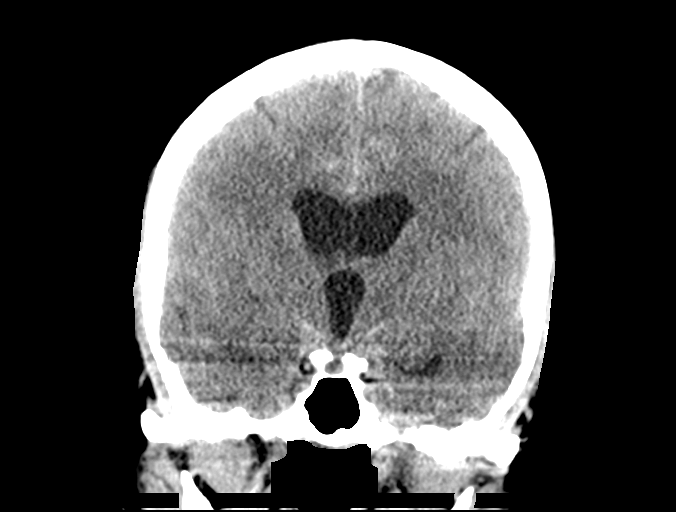

[13 of 47 positions shown; findings below may reference images not displayed]

FINDINGS: Brain: There is interval increased moderate subarachnoid hemorrhage
in the interhemispheric fissures, suprasellar, midbrain and
posterior fossa cisterns, and in the right-greater-than-left sylvian
fissures.

Small amount of layering blood products have also increased in the
posterior horns of both ventricles and in the posterior aspect of
the third ventricle extending into the Terolli aqueduct.

In addition there is increased generalized cerebral edema and sulcal
crowding and increased moderate ventriculomegaly involving the third
and lateral ventricles.

No large territorial infarct or midline shift is seen. No
parenchymal bleed is evident.

There does not appear to be increased cerebellar or brainstem edema
at least by CT appearance.

No cerebellar tonsillar herniation is seen with the left tonsil
lower in position than the right but unchanged.

There is increased basal cisternal crowding along with the increased
cerebral edema. The fourth ventricle appears no narrower than
previously

Vascular: Difficult to evaluate given the presence of contrast and
subarachnoid hemorrhage. CTA head demonstrated 4-5 mm aneurysms
suspected of the supraclinoid left ICA and ACOM but these are not
visible due to subarachnoid hemorrhaging.

Skull: No focal abnormality.

Sinuses/Orbits: Clear paranasal sinuses. Unremarkable orbital
contents.

Other: No mastoid effusion.
IMPRESSION: 1. Worsening moderate-volume subarachnoid hemorrhage as detailed
above.
2. Increased cerebral edema and sulcal crowding. Increased crowding
in the basal cisterns as well but no tonsillar herniation is seen.
3. Increased moderate hydrocephalus.
4. We are attempting to reach the ordering physician to relay these
results. The report will be addended when I have spoken to the
ordering physician.

ADDENDUM:
Case discussed over the phone with Dr. Zahalka at [DATE] p.m.,
01/06/2022, with verbal acknowledgement of findings.

*** End of Addendum ***
FINDINGS: Brain: There is interval increased moderate subarachnoid hemorrhage
in the interhemispheric fissures, suprasellar, midbrain and
posterior fossa cisterns, and in the right-greater-than-left sylvian
fissures.

Small amount of layering blood products have also increased in the
posterior horns of both ventricles and in the posterior aspect of
the third ventricle extending into the Terolli aqueduct.

In addition there is increased generalized cerebral edema and sulcal
crowding and increased moderate ventriculomegaly involving the third
and lateral ventricles.

No large territorial infarct or midline shift is seen. No
parenchymal bleed is evident.

There does not appear to be increased cerebellar or brainstem edema
at least by CT appearance.

No cerebellar tonsillar herniation is seen with the left tonsil
lower in position than the right but unchanged.

There is increased basal cisternal crowding along with the increased
cerebral edema. The fourth ventricle appears no narrower than
previously

Vascular: Difficult to evaluate given the presence of contrast and
subarachnoid hemorrhage. CTA head demonstrated 4-5 mm aneurysms
suspected of the supraclinoid left ICA and ACOM but these are not
visible due to subarachnoid hemorrhaging.

Skull: No focal abnormality.

Sinuses/Orbits: Clear paranasal sinuses. Unremarkable orbital
contents.

Other: No mastoid effusion.
IMPRESSION: 1. Worsening moderate-volume subarachnoid hemorrhage as detailed
above.
2. Increased cerebral edema and sulcal crowding. Increased crowding
in the basal cisterns as well but no tonsillar herniation is seen.
3. Increased moderate hydrocephalus.
4. We are attempting to reach the ordering physician to relay these
results. The report will be addended when I have spoken to the
ordering physician.

## 2023-06-29 IMAGING — CT CT ANGIO HEAD
3 of 7 series · 14 of 47 positions shown · non-contrast
Comparison: Same day CT head.

CLINICAL DATA: Subarachnoid hemorrhage on same day CT head.
Evaluate for aneurysm.

EXAM:
CT ANGIOGRAPHY HEAD
TECHNIQUE: Multidetector CT imaging of the head was performed using the
standard protocol during bolus administration of intravenous
contrast. Multiplanar CT image reconstructions and MIPs were
obtained to evaluate the vascular anatomy.

[Series 7: ax thin · axial · 0.32mm/px · z∈[-186,-20]mm · 8 of 204 slices shown]
[im 24/204  brain]
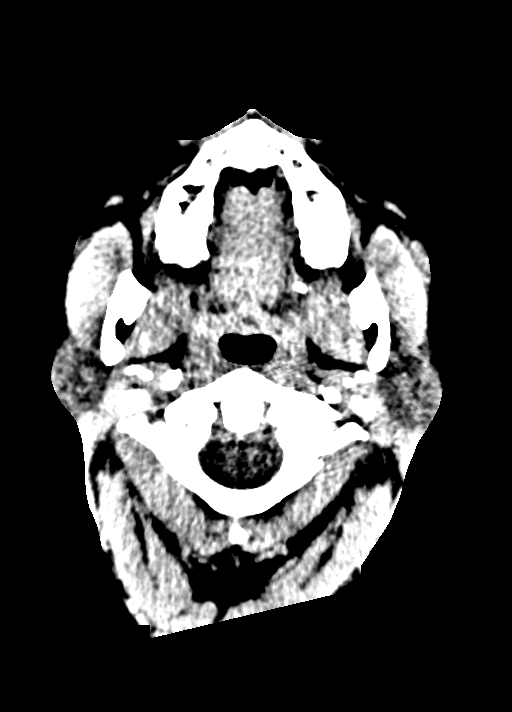
[im 48/204  bone]
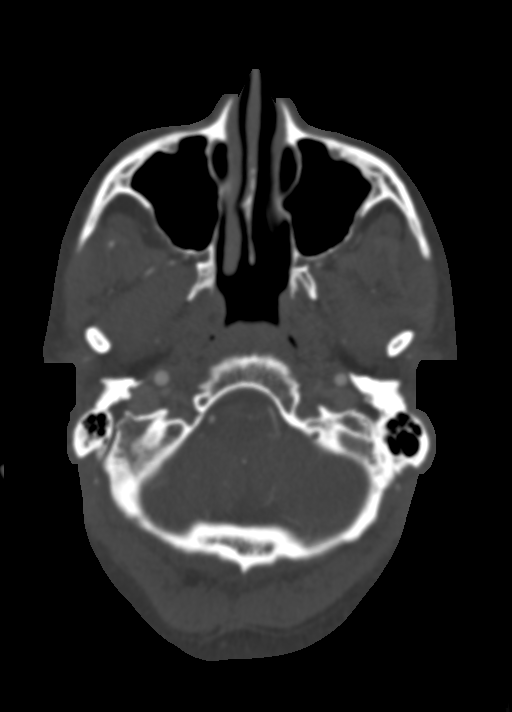
[im 72/204  brain]
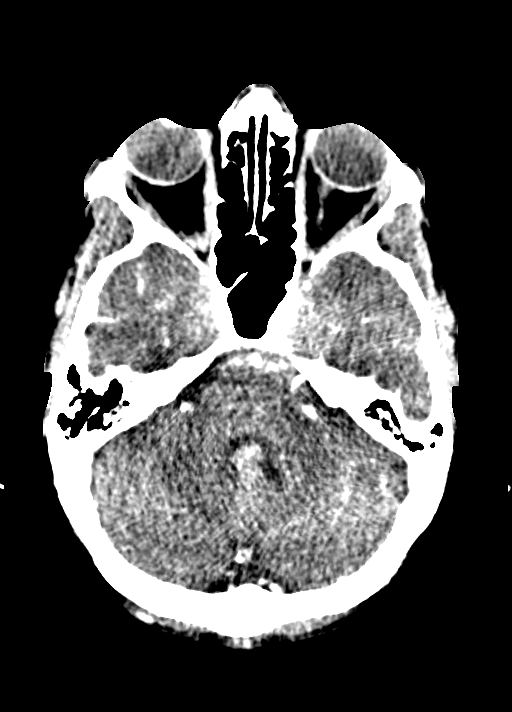
[im 96/204  bone]
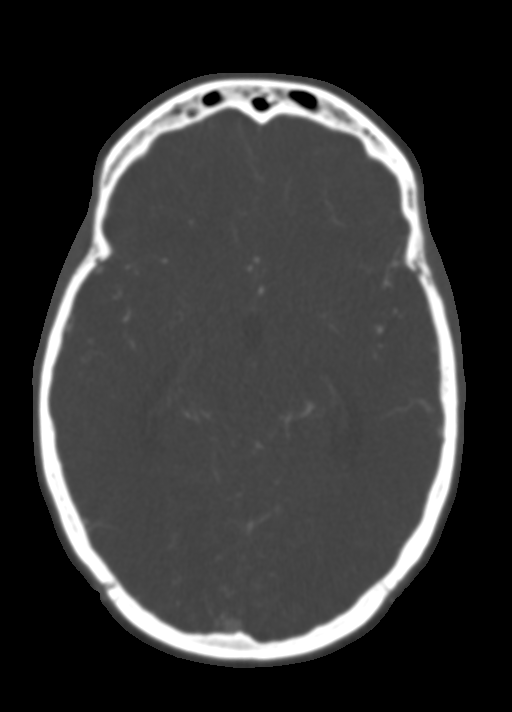
[im 120/204  brain]
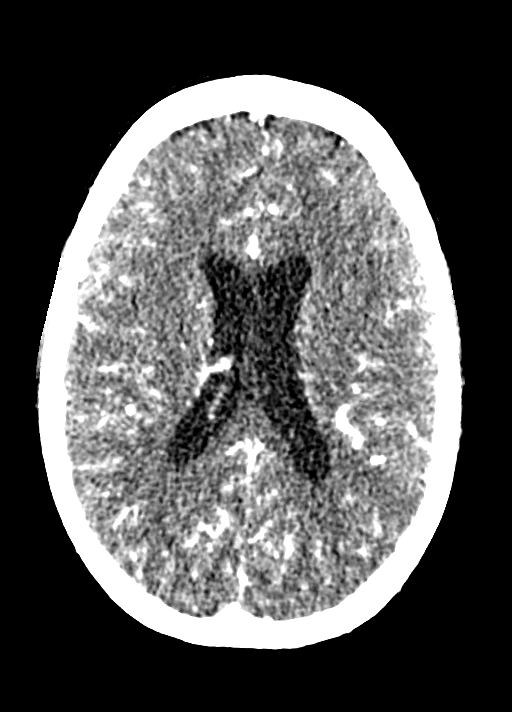
[im 144/204  bone]
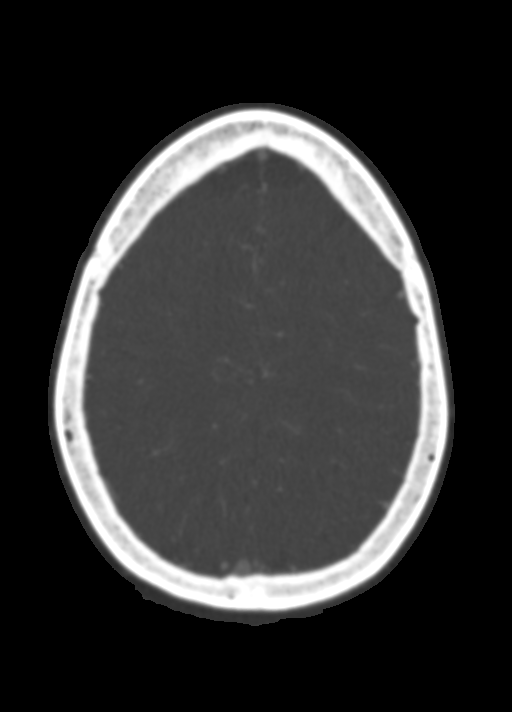
[im 168/204  brain]
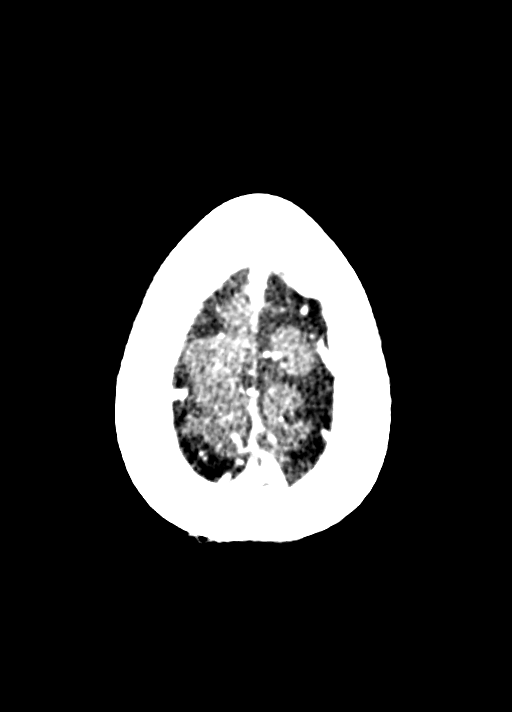
[im 192/204  bone]
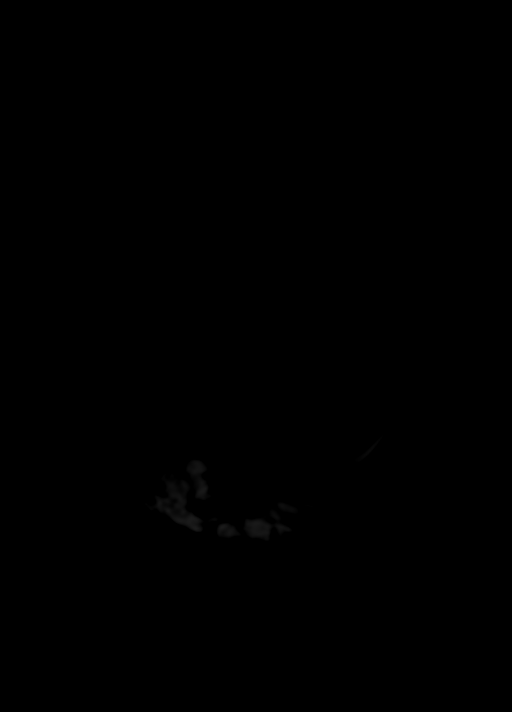

[Series 8: coronal thin · coronal · 0.32mm/px · 3 of 229 slices shown]
[im 66/229  brain]
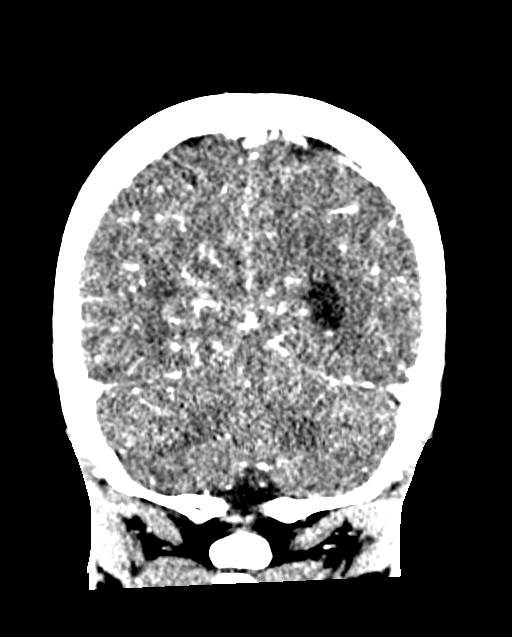
[im 98/229  brain]
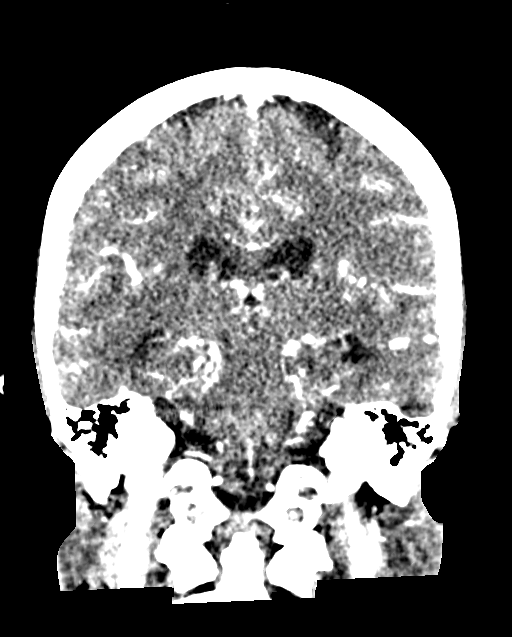
[im 131/229  brain]
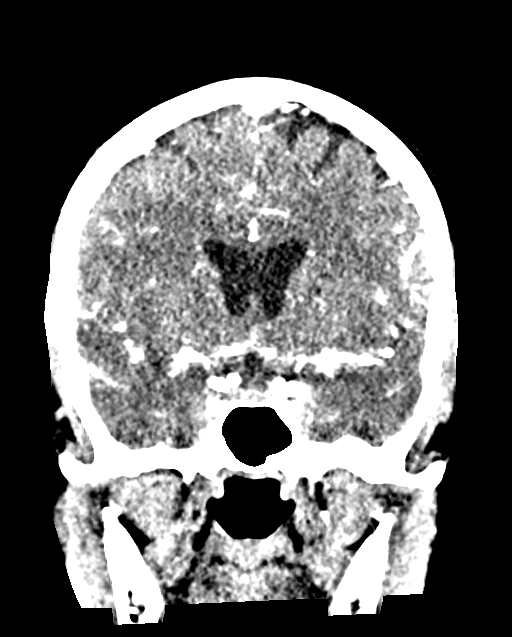

[Series 9: sagittal thin · sagittal · 0.40mm/px · 3 of 161 slices shown]
[im 33/161  brain]
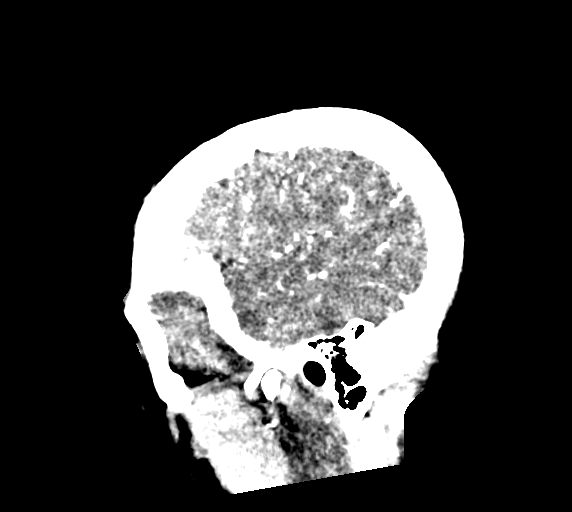
[im 65/161  brain]
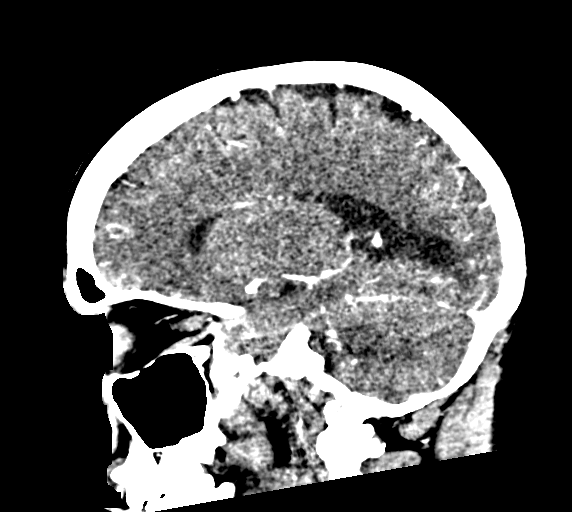
[im 97/161  brain]
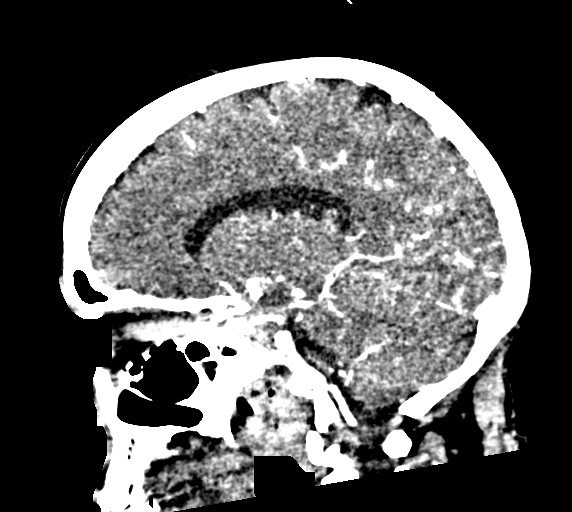

[14 of 47 positions shown; findings below may reference images not displayed]

RADIATION DOSE REDUCTION: This exam was performed according to the
departmental dose-optimization program which includes automated
exposure control, adjustment of the mA and/or kV according to
patient size and/or use of iterative reconstruction technique.

CONTRAST:  75mL OMNIPAQUE IOHEXOL 350 MG/ML SOLN
FINDINGS: CTA HEAD

Anterior circulation: Bilateral intracranial ICAs, MCAs, and ACAs
are patent without proximal hemodynamically significant stenosis.

Irregular, lobulated 5 x 4 mm outpouching rising from the left
supraclinoid ICA (see series 5, image 38).

Additional 4 x 3 mm anterior communicating artery aneurysm (series
5, image 44; series 8, image 94).

Posterior circulation: Bilateral intradural vertebral arteries,
basilar artery, and bilateral posterior cerebral arteries are patent
without proximal hemodynamically significant stenosis. Right fetal
type PCA, anatomic variant.

Venous sinuses: As permitted by contrast timing, patent.

Review of the MIP images confirms the above findings.
IMPRESSION: 1. Irregular, lobulated 5 x 4 mm outpouching rising from the left
supraclinoid ICA, concerning for aneurysm that probably is ruptured
given irregular morphology and subarachnoid hemorrhage seen on same
day CT head.
2. Additional 4 x 3 mm anterior communicating artery aneurysm.
3. No emergent large vessel occlusion or proximal hemodynamically
significant stenosis.

Findings discussed with Dr. Tadesha via telephone at [DATE] p.m.

## 2023-07-03 ENCOUNTER — Other Ambulatory Visit: Payer: Self-pay | Admitting: Neurosurgery

## 2023-07-03 ENCOUNTER — Other Ambulatory Visit: Payer: Self-pay

## 2023-07-03 ENCOUNTER — Ambulatory Visit (HOSPITAL_COMMUNITY)
Admission: RE | Admit: 2023-07-03 | Discharge: 2023-07-03 | Disposition: A | Discharge status: Home / Self Care | Payer: 59 | Source: Ambulatory Visit | Attending: Neurosurgery | Admitting: Neurosurgery

## 2023-07-03 DIAGNOSIS — Z87891 Personal history of nicotine dependence: Secondary | ICD-10-CM | POA: Diagnosis not present

## 2023-07-03 DIAGNOSIS — I671 Cerebral aneurysm, nonruptured: Secondary | ICD-10-CM | POA: Diagnosis present

## 2023-07-03 DIAGNOSIS — I609 Nontraumatic subarachnoid hemorrhage, unspecified: Secondary | ICD-10-CM

## 2023-07-03 HISTORY — PX: IR US GUIDE VASC ACCESS RIGHT: IMG2390

## 2023-07-03 HISTORY — PX: IR ANGIO VERTEBRAL SEL VERTEBRAL UNI L MOD SED: IMG5367

## 2023-07-03 HISTORY — PX: IR ANGIO INTRA EXTRACRAN SEL INTERNAL CAROTID BILAT MOD SED: IMG5363

## 2023-07-03 LAB — BASIC METABOLIC PANEL
Anion gap: 9 (ref 5–15)
BUN: 9 mg/dL (ref 6–20)
CO2: 22 mmol/L (ref 22–32)
Calcium: 8.4 mg/dL — ABNORMAL LOW (ref 8.9–10.3)
Chloride: 104 mmol/L (ref 98–111)
Creatinine, Ser: 0.55 mg/dL (ref 0.44–1.00)
GFR, Estimated: >60 mL/min (ref >60)
Glucose, Bld: 88 mg/dL (ref 70–99)
Potassium: 3.7 mmol/L (ref 3.5–5.1)
Sodium: 135 mmol/L (ref 135–145)

## 2023-07-03 LAB — URINALYSIS, ROUTINE W REFLEX MICROSCOPIC
Bilirubin Urine: NEGATIVE
Glucose, UA: NEGATIVE mg/dL
Hgb urine dipstick: NEGATIVE
Ketones, ur: NEGATIVE mg/dL
Leukocytes,Ua: NEGATIVE
Nitrite: NEGATIVE
Protein, ur: NEGATIVE mg/dL
Specific Gravity, Urine: 1.019 (ref 1.005–1.030)
pH: 6 (ref 5.0–8.0)

## 2023-07-03 LAB — CBC WITH DIFFERENTIAL/PLATELET
Abs Immature Granulocytes: 0.02 10*3/uL (ref 0.00–0.07)
Basophils Absolute: 0 10*3/uL (ref 0.0–0.1)
Basophils Relative: 1 %
Eosinophils Absolute: 0.1 10*3/uL (ref 0.0–0.5)
Eosinophils Relative: 2 %
HCT: 37.3 % (ref 36.0–46.0)
Hemoglobin: 12.7 g/dL (ref 12.0–15.0)
Immature Granulocytes: 0 %
Lymphocytes Relative: 13 %
Lymphs Abs: 0.8 10*3/uL (ref 0.7–4.0)
MCH: 30 pg (ref 26.0–34.0)
MCHC: 34 g/dL (ref 30.0–36.0)
MCV: 88.2 fL (ref 80.0–100.0)
Monocytes Absolute: 0.5 10*3/uL (ref 0.1–1.0)
Monocytes Relative: 7 %
Neutro Abs: 4.9 10*3/uL (ref 1.7–7.7)
Neutrophils Relative %: 77 %
Platelets: 229 10*3/uL (ref 150–400)
RBC: 4.23 MIL/uL (ref 3.87–5.11)
RDW: 12.5 % (ref 11.5–15.5)
WBC: 6.4 10*3/uL (ref 4.0–10.5)
nRBC: 0 % (ref 0.0–0.2)

## 2023-07-03 LAB — PROTIME-INR
INR: 1 (ref 0.8–1.2)
Prothrombin Time: 13.4 s (ref 11.4–15.2)

## 2023-07-03 LAB — PREGNANCY, URINE: Preg Test, Ur: NEGATIVE

## 2023-07-03 LAB — APTT: aPTT: 29 s (ref 24–36)

## 2023-07-03 MED ORDER — HYDROCODONE-ACETAMINOPHEN 5-325 MG PO TABS: 1.0000 | ORAL_TABLET | ORAL | Status: DC | PRN

## 2023-07-03 MED ORDER — MIDAZOLAM HCL 2 MG/2ML IJ SOLN
INTRAMUSCULAR | Status: AC | PRN
Start: 2023-07-03 — End: 2023-07-03
  Administered 2023-07-03: 1 mg via INTRAVENOUS
  Administered 2023-07-03: .5 mg via INTRAVENOUS

## 2023-07-03 MED ORDER — CEFAZOLIN SODIUM-DEXTROSE 2-4 GM/100ML-% IV SOLN: 2.0000 g | INTRAVENOUS | Status: DC

## 2023-07-03 MED ORDER — FENTANYL CITRATE (PF) 100 MCG/2ML IJ SOLN
INTRAMUSCULAR | Status: AC
  Filled 2023-07-03: qty 2

## 2023-07-03 MED ORDER — LIDOCAINE HCL 1 % IJ SOLN
INTRAMUSCULAR | Status: AC
Start: 2023-07-03 — End: ?
  Filled 2023-07-03: qty 20

## 2023-07-03 MED ORDER — MIDAZOLAM HCL 2 MG/2ML IJ SOLN
INTRAMUSCULAR | Status: AC
  Filled 2023-07-03: qty 2

## 2023-07-03 MED ORDER — HEPARIN SODIUM (PORCINE) 1000 UNIT/ML IJ SOLN
INTRAMUSCULAR | Status: AC | PRN
  Administered 2023-07-03: 2000 [IU] via INTRAVENOUS

## 2023-07-03 MED ORDER — CHLORHEXIDINE GLUCONATE CLOTH 2 % EX PADS: 6.0000 | MEDICATED_PAD | Freq: Once | CUTANEOUS | Status: DC

## 2023-07-03 MED ORDER — VERAPAMIL HCL 2.5 MG/ML IV SOLN
INTRAVENOUS | Status: AC
Start: 2023-07-03 — End: ?
  Filled 2023-07-03: qty 2

## 2023-07-03 MED ORDER — FENTANYL CITRATE (PF) 100 MCG/2ML IJ SOLN
INTRAMUSCULAR | Status: AC | PRN
Start: 2023-07-03 — End: 2023-07-03
  Administered 2023-07-03 (×2): 25 ug via INTRAVENOUS

## 2023-07-03 MED ORDER — HEPARIN SODIUM (PORCINE) 1000 UNIT/ML IJ SOLN
INTRAMUSCULAR | Status: AC
  Filled 2023-07-03: qty 10

## 2023-07-03 MED ORDER — IOHEXOL 300 MG/ML  SOLN
100.0000 mL | Freq: Once | INTRAMUSCULAR | Status: AC | PRN
  Administered 2023-07-03: 35 mL via INTRA_ARTERIAL

## 2023-07-03 MED ORDER — NITROGLYCERIN 1 MG/10 ML FOR IR/CATH LAB
INTRA_ARTERIAL | Status: AC
Start: 2023-07-03 — End: ?
  Filled 2023-07-03: qty 10

## 2023-07-03 NOTE — H&P (Signed)
Chief Complaint   Aneurysm  History of Present Illness  Judy Walsh is a 47 y.o. 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 She has made an excellent recovery and comes in today for routine long-term angiographic follow-up.  Past Medical History   Past Medical History:  Diagnosis Date   Anxiety    Chest x-ray abnormality 06/09/2021   CXR 11/22: Vague nodular opacities in the mid left lung and right upper lobe which may be due to overlapping structures. Comparison with older imaging studies or follow-up is recommended to exclude pulmonary nodule.    Family history of brain aneurysm    High cholesterol    Migraine    Myopericarditis 06/08/2021   admx 11/22 >> hsTrop mildly elevated w/o trend; BNP and CRP high - trending down at DC // Echocardiogram 11/22: no effusion, EF 55-60, no RWMA, mild LVH >> NSAIDs/Colchicine    Past Surgical History   Past Surgical History:  Procedure Laterality Date   IR ANGIO INTRA EXTRACRAN SEL INTERNAL CAROTID BILAT MOD SED  01/07/2022   IR ANGIO INTRA EXTRACRAN SEL INTERNAL CAROTID BILAT MOD SED  07/11/2022   IR ANGIO VERTEBRAL SEL VERTEBRAL UNI R MOD SED  01/08/2022   IR ANGIO VERTEBRAL SEL VERTEBRAL UNI R MOD SED  07/11/2022   IR ANGIOGRAM FOLLOW UP STUDY  01/07/2022   IR ANGIOGRAM FOLLOW UP STUDY  01/07/2022   IR ANGIOGRAM FOLLOW UP STUDY  01/07/2022   IR ANGIOGRAM FOLLOW UP STUDY  01/07/2022   IR NEURO EACH ADD'L AFTER BASIC UNI LEFT (MS)  01/08/2022   IR NEURO EACH ADD'L AFTER BASIC UNI RIGHT (MS)  01/08/2022   IR TRANSCATH/EMBOLIZ  01/07/2022   RADIOLOGY WITH ANESTHESIA N/A 01/07/2022   Procedure: IR WITH ANESTHESIA;  Surgeon: Lisbeth Renshaw, MD;  Location: [facility] OR;  Service: Radiology;  Laterality: N/A;    Social History   Social History   Tobacco Use   Smoking status: Former    Types: Cigarettes   Smokeless tobacco: Never  Vaping Use   Vaping status: Never  Used  Substance Use Topics   Alcohol use: Not Currently   Drug use: Never    Medications   Prior to Admission medications   Medication Sig Start Date End Date Taking? Authorizing Provider  amoxicillin-clavulanate (AUGMENTIN) 875-125 MG tablet Take 1 tablet by mouth 2 (two) times daily.    [provider]  Cholecalciferol (VITAMIN D3) 50 MCG (2000 UT) TABS Take 1 tablet by mouth daily.    [provider]  escitalopram (LEXAPRO) 10 MG tablet Take 10 mg by mouth daily. 04/23/22   [provider]  escitalopram (LEXAPRO) 10 MG tablet Take 1 tablet by mouth daily.    [provider]  Multiple Vitamin (MULTIVITAMIN WITH MINERALS) TABS tablet Take 1 tablet by mouth daily. 01/27/22   Angiulli, Mcarthur Rossetti, PA-C  phentermine (ADIPEX-P) 37.5 MG tablet Take by oral route for 30 days. 05/18/22   [provider]  pravastatin (PRAVACHOL) 40 MG tablet Take 40 mg by mouth daily.    [provider]  predniSONE (DELTASONE) 5 MG tablet Take 1 tablet by mouth daily.    [provider]  progesterone (PROMETRIUM) 200 MG capsule SMARTSIG:1 Caplet By Mouth Every Evening    [provider]  propranolol (INDERAL) 10 MG tablet Take 1 tablet (10 mg total) by mouth 3 (three) times daily. 05/14/22   Ranelle Oyster, MD  Bernita Raisin  100 MG TABS Take 1 tablet by mouth once.    [provider]  UBRELVY 50 MG TABS Take by mouth. 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 female 1.5 years s/p SAH and coiling of Acom and left Pcom aneurysms.  Plan  - We will proceed with diagnsotic cerebral angiogram  I have reviewed the indications for the procedure as well as the details of the procedure and the expected postoperative course and recovery at length with the patient in the office. We have also reviewed in detail the risks, benefits, and alternatives to the procedure. All questions were answered and Judy Walsh provided informed consent to proceed.  Lisbeth Renshaw, MD [facility]

## 2023-07-03 NOTE — Sedation Documentation (Signed)
Patient transported to short stay via stretcher. Femoral site, radial site, and pulses assessed with short stay RN.

## 2023-07-03 NOTE — Discharge Instructions (Signed)

## 2023-11-18 ENCOUNTER — Encounter (HOSPITAL_COMMUNITY): Payer: Self-pay | Admitting: Obstetrics and Gynecology

## 2023-11-18 NOTE — Progress Notes (Signed)
 error

## 2023-11-19 NOTE — H&P (Signed)
 Judy Walsh is an 48 y.o. female presents for surgical evaluation and treatment of uterine fibroids and AUB that has not improved with conservative management including OCAs.  Marital status: Separated Gender Identity and LGBTQ Identity Sexual orientation: Straight or heterosexual Surgical History Surgical History not reviewed (last reviewed 06/08/2023) Vaginal delivery of fetus - 1998/2006 Reconstruction of anterior cruciate ligament of knee joint - 2018/2019/2020 GYN History Reviewed GYN History Date of LMP: 05/29/2023 (Notes: Irreg). Date of Last Pap Smear: 03/26/2021 (Notes: WNL, NEXT DUE 2025.). History of Abnormal PAP: N. Date of Last Mammogram: 07/22/2022 (Notes: PWG/Negative.). History of Cervical Dysplasia: N. Sexually Active: Y. Sexual Orientation: Heterosexual. History of Sexually Transmitted Infection: Y (Notes: Herpes). Current Birth Control Method:: Combined Oral Contraceptive Pills. Obstetric History Obstetric History not reviewed (last reviewed 06/08/2023) TOTAL FULL PRE AB. I AB. S ECTOPICS MULTIPLE LIVING 3 2 0 1 0 0 0 2 Past Pregnancies Date # Fetuses GA Wks Labor Length Birth Weight Sex Delivery Type Outcome Anesthesia Delivery Place Preterm Labor Notes Source 08/28/1996 1      Vaginal Delivery Full Term Birth     historical 01/25/2005 1      Vaginal Delivery Full Term Birth     historical Past Medical History Past Medical History not reviewed (last reviewed 06/08/2023) GI- Irritable Bowel Syndrome: Y Screening None recorded. HPI AUB ROS Constitutional: Constitutional: no significant weight gain or loss and no fatigue or fever.  Skin: Skin: no rashes or abnormal moles.  Respiratory: Respiratory: no wheezing or dyspnea / shortness of breath.  Cardiovascular: Cardiovascular: no palpitations or chest pain.  Gastrointestinal: Gastrointestinal: no heartburn, dysphagia, nausea, vomiting, diarrhea, constipation, abdominal pain, bowel movement changes, or  rectal bleeding.  Genitourinary: Genitourinary: no vaginal odor or itching; no hematuria, incontinence, rash, lesion, discharge, flank pain, or trouble urinating; and abnormal bleeding.  Endocrine: Menstrual: no menstrual problems or PMDD symptoms. Menopausal: no menopausal symptoms. Sexual: no sexual problems.  Musculoskeletal: Musculoskeletal: no muscle aches or weakness and no arthralgias/joint pain or back pain.  Neurological: Neurologic: no headaches, dizziness, LOC, weakness, or numbness.  Psychological: Psych: no depression, alcoholism, or sleep disturbances. Physical Exam Vitals and nursing note reviewed. Exam conducted with a chaperone present.  Constitutional:      Appearance: Normal appearance.  HENT:     Head: Normocephalic.  Eyes:     Pupils: Pupils are equal, round, and reactive to light.  Cardiovascular:     Rate and Rhythm: Normal rate and regular rhythm.     Pulses: Normal pulses.  Abdominal:     General: Abdomen is Gravid, nontender Neurological:     Mental Status: She is alert. Pt wishes Full resuscitation in the event of a code.  Procedure Documentation Saline Infusion Sonogram (SIS) [facility]):  The risks, benefits, indications and alternatives of the procedure were discussed with the patient. All questions were answered. Informed consent was obtained.  The patient was brought to the ultrasound suite. A speculum was placed in the patient's vagina, and the cervix was visualized and prepped with Povidone Iodine. A single toothed tenaculum was not placed on the cervix. A small, flexible catheter was threaded through the cervix. The instruments were removed. Normal saline was introduced into the uterine cavity through the catheter. Real-time endovaginal ultrasonography was performed to view the endometrial cavity. An imaging sweep was done. The catheter was removed once imaging was completed.  Findings: normal endometrial cavity fibroid submucosal and displacing  posterior wall 2.2 cm in size      Past  Medical History:  Diagnosis Date   Anxiety    Chest x-ray abnormality 06/09/2021   CXR 11/22: Vague nodular opacities in the mid left lung and right upper lobe which may be due to overlapping structures. Comparison with older imaging studies or follow-up is recommended to exclude pulmonary nodule.    Family history of brain aneurysm    High cholesterol    Migraine    Myopericarditis 06/08/2021   admx 11/22 >> hsTrop mildly elevated w/o trend; BNP and CRP high - trending down at DC // Echocardiogram 11/22: no effusion, EF 55-60, no RWMA, mild LVH >> NSAIDs/Colchicine     Past Surgical History:  Procedure Laterality Date   CEREBRAL ANEURYSM REPAIR  06/2022   IR ANGIO INTRA EXTRACRAN SEL INTERNAL CAROTID BILAT MOD SED  01/07/2022   IR ANGIO INTRA EXTRACRAN SEL INTERNAL CAROTID BILAT MOD SED  07/11/2022   IR ANGIO INTRA EXTRACRAN SEL INTERNAL CAROTID BILAT MOD SED  07/03/2023   IR ANGIO VERTEBRAL SEL VERTEBRAL UNI L MOD SED  07/03/2023   IR ANGIO VERTEBRAL SEL VERTEBRAL UNI R MOD SED  01/08/2022   IR ANGIO VERTEBRAL SEL VERTEBRAL UNI R MOD SED  07/11/2022   IR ANGIOGRAM FOLLOW UP STUDY  01/07/2022   IR ANGIOGRAM FOLLOW UP STUDY  01/07/2022   IR ANGIOGRAM FOLLOW UP STUDY  01/07/2022   IR ANGIOGRAM FOLLOW UP STUDY  01/07/2022   IR NEURO EACH ADD'L AFTER BASIC UNI LEFT (MS)  01/08/2022   IR NEURO EACH ADD'L AFTER BASIC UNI RIGHT (MS)  01/08/2022   IR TRANSCATH/EMBOLIZ  01/07/2022   IR US  GUIDE VASC ACCESS RIGHT  07/03/2023   IR US  GUIDE VASC ACCESS RIGHT  07/03/2023   RADIOLOGY WITH ANESTHESIA N/A 01/07/2022   Procedure: IR WITH ANESTHESIA;  Surgeon: Augusto Blonder, MD;  Location: [facility] OR;  Service: Radiology;  Laterality: N/A;    Family History  Problem Relation Age of Onset   Stroke Father    CVA Father     Social History:  reports that she has quit smoking. Her smoking use included cigarettes. She has never used smokeless tobacco. She  reports that she does not currently use alcohol. She reports that she does not use drugs.  Allergies:  Allergies  Allergen Reactions   Benadryl [Diphenhydramine] Hives   Diphenhydramine Hcl     No medications prior to admission.    Review of Systems  Weight 75.8 kg. Physical Exam  No results found for this or any previous visit (from the past 24 hours).  No results found.  Assessment/Plan:  Assessment / Plan  1. Abnormal uterine bleeding -  fibroid submucosal and displacing posterior wall  N93.9: Abnormal uterine and vaginal bleeding, unspecified US , SALINE INFUSED UTERUS Currently on Sprintec  and AUB persists.  2. Uterine leiomyoma - Discussed uterine leiomyoma including typically benign pathology. Symptoms usually associated with fibroid location and size. Discussed evaluation, followup and treatment options including hormonal treatment options like ocas, myfembree, and effects at menopause. Discussed surgical options including myomectomy, fibroid ablation and hysterectomy. Questions were answered. wants fibroid ablation. may need myosure resection of fibroid will increase dose of ocas in the meantime (from 20mcg pills previously) Recent normal FSH on placebos D25.9: Leiomyoma of uterus, unspecified RADIOFREQUENCY ABLATION, TRANSCERVICAL UTERINE FIBROID(S), WITH ULTRASOUND GUIDANCE (PROC) Reason: fibroid, aub desires sonata with possible Elinor Guardian 11/19/2023, 6:57 AM

## 2023-11-23 ENCOUNTER — Encounter (HOSPITAL_COMMUNITY): Admission: RE | Payer: Self-pay | Source: Home / Self Care

## 2023-11-23 ENCOUNTER — Ambulatory Visit (HOSPITAL_COMMUNITY): Admission: RE | Admit: 2023-11-23 | Payer: 59 | Source: Home / Self Care | Admitting: Obstetrics and Gynecology

## 2023-11-23 SURGERY — DILATATION & CURETTAGE/HYSTEROSCOPY WITH MYOSURE
Anesthesia: Choice

## 2024-01-01 ENCOUNTER — Encounter: Payer: Self-pay | Admitting: Gastroenterology

## 2024-02-03 ENCOUNTER — Ambulatory Visit (AMBULATORY_SURGERY_CENTER)

## 2024-02-03 VITALS — Ht 67.0 in | Wt 165.0 lb

## 2024-02-03 DIAGNOSIS — Z1211 Encounter for screening for malignant neoplasm of colon: Secondary | ICD-10-CM

## 2024-02-03 MED ORDER — NA SULFATE-K SULFATE-MG SULF 17.5-3.13-1.6 GM/177ML PO SOLN: 1.0000 | Freq: Once | ORAL | 0 refills | Status: AC

## 2024-02-03 NOTE — Progress Notes (Signed)

## 2024-02-10 ENCOUNTER — Encounter: Payer: Self-pay | Admitting: Gastroenterology

## 2024-02-12 ENCOUNTER — Encounter: Payer: Self-pay | Admitting: Advanced Practice Midwife

## 2024-02-19 ENCOUNTER — Encounter: Admitting: Gastroenterology

## 2024-02-29 ENCOUNTER — Other Ambulatory Visit: Payer: Self-pay | Admitting: Medical Genetics

## 2024-03-01 ENCOUNTER — Telehealth: Payer: Self-pay | Admitting: Gastroenterology

## 2024-03-01 NOTE — Telephone Encounter (Signed)
 Inbound call from patient states she has a horrible chest cough for about 3 days now. Patient states she has taken over the counter dayquill medication.   Please advise.

## 2024-03-01 NOTE — Telephone Encounter (Signed)
 Verified by 2 ID's Started with sore throat  3 days of chest cough  No sputum  No fever at this time No other S/S Taking Alkalizer  Will go to MD if develops into an issue. RN instructed pt to keep hydrated Pt has no other questions at this time

## 2024-03-04 ENCOUNTER — Encounter: Admitting: Gastroenterology

## 2024-03-16 ENCOUNTER — Other Ambulatory Visit (HOSPITAL_COMMUNITY)

## 2024-05-22 ENCOUNTER — Other Ambulatory Visit: Payer: Self-pay | Admitting: Medical Genetics

## 2024-05-22 DIAGNOSIS — Z006 Encounter for examination for normal comparison and control in clinical research program: Secondary | ICD-10-CM
